# Patient Record
Sex: Female | Born: 1960 | Race: White | Hispanic: No | Marital: Single | Smoking: Never smoker
Health system: Southern US, Community
[De-identification: ages and names within clinical notes are randomized; demographics above are authoritative.]

## PROBLEM LIST (undated history)

## (undated) DIAGNOSIS — T7840XA Allergy, unspecified, initial encounter: Secondary | ICD-10-CM

## (undated) DIAGNOSIS — J45909 Unspecified asthma, uncomplicated: Secondary | ICD-10-CM

## (undated) DIAGNOSIS — R000 Tachycardia, unspecified: Secondary | ICD-10-CM

## (undated) DIAGNOSIS — J349 Unspecified disorder of nose and nasal sinuses: Secondary | ICD-10-CM

## (undated) DIAGNOSIS — T7491XA Unspecified adult maltreatment, confirmed, initial encounter: Secondary | ICD-10-CM

## (undated) DIAGNOSIS — F419 Anxiety disorder, unspecified: Secondary | ICD-10-CM

## (undated) DIAGNOSIS — I219 Acute myocardial infarction, unspecified: Secondary | ICD-10-CM

## (undated) DIAGNOSIS — R7989 Other specified abnormal findings of blood chemistry: Secondary | ICD-10-CM

## (undated) HISTORY — DX: Other specified abnormal findings of blood chemistry: R79.89

## (undated) HISTORY — DX: Allergy, unspecified, initial encounter: T78.40XA

## (undated) HISTORY — DX: Tachycardia, unspecified: R00.0

## (undated) HISTORY — DX: Acute myocardial infarction, unspecified: I21.9

## (undated) HISTORY — DX: Anxiety disorder, unspecified: F41.9

## (undated) HISTORY — DX: Unspecified asthma, uncomplicated: J45.909

## (undated) HISTORY — DX: Unspecified disorder of nose and nasal sinuses: J34.9

## (undated) HISTORY — DX: Unspecified adult maltreatment, confirmed, initial encounter: T74.91XA

---

## 1998-06-19 ENCOUNTER — Inpatient Hospital Stay (HOSPITAL_COMMUNITY): Admission: EM | Admit: 1998-06-19 | Discharge: 1998-06-22 | Payer: Self-pay | Admitting: Emergency Medicine

## 1998-06-19 ENCOUNTER — Encounter: Payer: Self-pay | Admitting: Emergency Medicine

## 1998-06-20 ENCOUNTER — Encounter: Payer: Self-pay | Admitting: Family Medicine

## 1998-06-26 ENCOUNTER — Encounter: Admission: RE | Admit: 1998-06-26 | Discharge: 1998-06-26 | Payer: Self-pay | Admitting: Family Medicine

## 2009-01-29 ENCOUNTER — Emergency Department (HOSPITAL_COMMUNITY): Admission: EM | Admit: 2009-01-29 | Discharge: 2009-01-30 | Payer: Self-pay | Admitting: Emergency Medicine

## 2009-09-18 ENCOUNTER — Ambulatory Visit: Payer: Self-pay | Admitting: Psychiatry

## 2009-09-26 ENCOUNTER — Ambulatory Visit: Payer: Self-pay | Admitting: Psychiatry

## 2009-10-09 ENCOUNTER — Ambulatory Visit: Payer: Self-pay | Admitting: Psychiatry

## 2010-04-14 ENCOUNTER — Emergency Department (HOSPITAL_COMMUNITY): Admission: EM | Admit: 2010-04-14 | Discharge: 2010-04-14 | Payer: Self-pay | Admitting: Emergency Medicine

## 2010-07-09 ENCOUNTER — Emergency Department (HOSPITAL_COMMUNITY)
Admission: EM | Admit: 2010-07-09 | Discharge: 2010-07-10 | Payer: Self-pay | Source: Home / Self Care | Admitting: Emergency Medicine

## 2010-12-23 LAB — CBC
HCT: 37.7 % (ref 36.0–46.0)
Hemoglobin: 12.6 g/dL (ref 12.0–15.0)
WBC: 10.3 10*3/uL (ref 4.0–10.5)

## 2010-12-23 LAB — BASIC METABOLIC PANEL
Calcium: 8.9 mg/dL (ref 8.4–10.5)
GFR calc Af Amer: >60 mL/min (ref >60)
GFR calc non Af Amer: >60 mL/min (ref >60)
Potassium: 3.7 mEq/L (ref 3.5–5.1)
Sodium: 135 mEq/L (ref 135–145)

## 2010-12-23 LAB — POCT CARDIAC MARKERS
CKMB, poc: 1.8 ng/mL (ref 1.0–8.0)
Myoglobin, poc: 32.4 ng/mL (ref 12–200)
Troponin i, poc: <0.05 ng/mL (ref 0.00–0.09)

## 2010-12-23 LAB — DIFFERENTIAL
Basophils Absolute: 0.3 10*3/uL — ABNORMAL HIGH (ref 0.0–0.1)
Basophils Relative: 3 % — ABNORMAL HIGH (ref 0–1)
Lymphocytes Relative: 25 % (ref 12–46)
Monocytes Absolute: 0.9 10*3/uL (ref 0.1–1.0)
Monocytes Relative: 9 % (ref 3–12)
Neutro Abs: 6.1 10*3/uL (ref 1.7–7.7)
Neutrophils Relative %: 60 % (ref 43–77)

## 2013-01-15 ENCOUNTER — Encounter: Payer: Self-pay | Admitting: Internal Medicine

## 2013-01-15 ENCOUNTER — Ambulatory Visit (INDEPENDENT_AMBULATORY_CARE_PROVIDER_SITE_OTHER): Payer: Medicaid Other | Admitting: Internal Medicine

## 2013-01-15 VITALS — BP 140/88 | HR 116 | Temp 97.7°F | Ht 60.0 in | Wt 88.2 lb

## 2013-01-15 DIAGNOSIS — J449 Chronic obstructive pulmonary disease, unspecified: Secondary | ICD-10-CM

## 2013-01-15 DIAGNOSIS — J4489 Other specified chronic obstructive pulmonary disease: Secondary | ICD-10-CM

## 2013-01-15 MED ORDER — PREDNISONE (PAK) 10 MG PO TABS: ORAL_TABLET | ORAL | Status: DC

## 2013-01-15 MED ORDER — BUDESONIDE-FORMOTEROL FUMARATE 160-4.5 MCG/ACT IN AERO: INHALATION_SPRAY | RESPIRATORY_TRACT | Status: DC

## 2013-01-15 NOTE — Progress Notes (Signed)
  Subjective:    Patient ID: Stacey Brock, female    DOB: July 30, 1961  MRN: [id number]  HPI  32 yowf never smoker with lifelong asthma last under control 2007 not seen by specialist despite failing symbicort 80 2 bid with multiple flares self referred to pulmonary clinic 01/15/13    01/15/2013 f/u ov/Lowery Paullin cc daily symptoms of sob and chest tightness with subjective wheeze x 5 years on symbicort 80 2bid with min assoc year round sneeze but ? Worse in fall, does not recall last pred rx but thinks "it worked some". Typically using saba at least twice daily for control including freq use of neb saba  No obvious daytime variabilty or assoc chronic cough  hb symptoms. No unusual exp hx or h/o childhood pna/ asthma or premature birth to her knowledge.   Sleeping ok without nocturnal  or early am exacerbation  of respiratory  c/o's or need for noct saba. Also denies any obvious fluctuation of symptoms with weather or environmental changes or other aggravating or alleviating factors except as outlined above      Review of Systems  Constitutional: Negative for fever and unexpected weight change.  HENT: Positive for sneezing. Negative for ear pain, nosebleeds, congestion, sore throat, rhinorrhea, trouble swallowing, dental problem, postnasal drip and sinus pressure.   Eyes: Negative for redness and itching.  Respiratory: Positive for shortness of breath. Negative for cough, chest tightness and wheezing.   Cardiovascular: Negative for palpitations and leg swelling.  Gastrointestinal: Negative for nausea and vomiting.  Genitourinary: Negative for dysuria.  Musculoskeletal: Negative for joint swelling.  Skin: Negative for rash.  Neurological: Positive for headaches.  Hematological: Does not bruise/bleed easily.  Psychiatric/Behavioral: Negative for dysphoric mood. The patient is not nervous/anxious.        Objective:   Physical Exam Thin amb wf nasal tone to voice  Wt Readings from Last 3  Encounters:  01/15/13 88 lb 3.2 oz (40.007 kg)     HEENT mild turbinate edema.  Oropharynx no thrush or excess pnd or cobblestoning.  No JVD or cervical adenopathy. Mild accessory muscle hypertrophy. Trachea midline, nl thryroid. Chest was hyperinflated by percussion with diminished breath sounds and moderate increased exp time without wheeze. Hoover sign positive at mid inspiration. Regular rate and rhythm without murmur gallop or rub or increase P2 or edema.  Abd: no hsm, nl excursion. Ext warm without cyanosis or clubbing.     cxr 07/09/10 showed hyperinflation    Assessment & Plan:

## 2013-01-15 NOTE — Patient Instructions (Addendum)
Work on inhaler technique:  relax and gently blow all the way out then take a nice smooth deep breath back in, triggering the inhaler at same time you start breathing in.  Hold for up to 5 seconds if you can.  Rinse and gargle with water when done   If your mouth or throat starts to bother you,   I suggest you time the inhaler to your dental care and after using the inhaler(s) brush teeth and tongue with a baking soda containing toothpaste and when you rinse this out, gargle with it first to see if this helps your mouth and throat.     symbicort 160 Take 2 puffs first thing in am and then another 2 puffs about 12 hours later.   Prednisone 10 mg take  4 each am x 2 days,   2 each am x 2 days,  1 each am x2days and stop   Return to see Yahayra NP in 2 weeks  Late add either use advair or laba/ics neb if not better with hfa Also needs alpha one AT testing and cxr on return then f/u with me with pfts

## 2013-01-16 DIAGNOSIS — J4550 Severe persistent asthma, uncomplicated: Secondary | ICD-10-CM | POA: Insufficient documentation

## 2013-01-16 NOTE — Assessment & Plan Note (Addendum)
DDX of  difficult airways managment all start with A and  include Adherence, Ace Inhibitors, Acid Reflux, Active Sinus Disease, Alpha 1 Antitripsin deficiency, Anxiety masquerading as Airways dz,  ABPA,  allergy(esp in young), Aspiration (esp in elderly), Adverse effects of DPI,  Active smokers, plus two Bs  = Bronchiectasis and Beta blocker use..and one C= CHF  Adherence is always the initial "prime suspect" and is a multilayered concern that requires a "trust but verify" approach in every patient - starting with knowing how to use medications, especially inhalers, correctly, keeping up with refills and understanding the fundamental difference between maintenance and prns vs those medications only taken for a very short course and then stopped and not refilled. The proper method of use, as well as anticipated side effects, of a metered-dose inhaler are discussed and demonstrated to the patient. Improved effectiveness after extensive coaching during this visit to a level of approximately  50% so first try max dose symbicort @ 2bid  ? Allergy > consider w/u next ov   Also needs alpha one AT testing and cxr on return then f/u with me with pfts

## 2013-01-29 ENCOUNTER — Ambulatory Visit (INDEPENDENT_AMBULATORY_CARE_PROVIDER_SITE_OTHER)
Admission: RE | Admit: 2013-01-29 | Discharge: 2013-01-29 | Disposition: A | Discharge status: Home / Self Care | Payer: Medicare Other | Source: Ambulatory Visit | Attending: Adult Health | Admitting: Adult Health

## 2013-01-29 ENCOUNTER — Other Ambulatory Visit: Payer: Medicare Other

## 2013-01-29 ENCOUNTER — Ambulatory Visit (INDEPENDENT_AMBULATORY_CARE_PROVIDER_SITE_OTHER): Payer: Medicare Other | Admitting: Adult Health

## 2013-01-29 ENCOUNTER — Encounter: Payer: Self-pay | Admitting: Adult Health

## 2013-01-29 VITALS — BP 118/66 | HR 108 | Temp 97.9°F | Ht 60.0 in | Wt 89.4 lb

## 2013-01-29 DIAGNOSIS — J449 Chronic obstructive pulmonary disease, unspecified: Secondary | ICD-10-CM

## 2013-01-29 MED ORDER — BUDESONIDE-FORMOTEROL FUMARATE 160-4.5 MCG/ACT IN AERO: INHALATION_SPRAY | RESPIRATORY_TRACT | Status: DC

## 2013-01-29 MED ORDER — ALBUTEROL SULFATE HFA 108 (90 BASE) MCG/ACT IN AERS: 2.0000 | INHALATION_SPRAY | Freq: Four times a day (QID) | RESPIRATORY_TRACT | Status: DC | PRN

## 2013-01-29 NOTE — Addendum Note (Signed)
Addended by: Boone Master E on: 01/29/2013 05:07 PM   Modules accepted: Orders

## 2013-01-29 NOTE — Assessment & Plan Note (Signed)
Recent Asthma flare now resolved  Check cxr and alpha one today  Plan  Continue on Symbicort 2 puffs Twice daily  , brush/rinse /gargle  We will call with labs and xray results.  Follow up Dr. Sherene Sires  In 4 weeks with PFTs

## 2013-01-29 NOTE — Progress Notes (Signed)
  Subjective:    Patient ID: Stacey Brock, female    DOB: [birth year]  MRN: [id number]  HPI  68 yowf never smoker with lifelong asthma last under control 2007 not seen by specialist despite failing symbicort 80 2 bid with multiple flares self referred to pulmonary clinic 01/15/13    01/15/2013 f/u ov/Wert cc daily symptoms of sob and chest tightness with subjective wheeze x 5 years on symbicort 80 2bid with min assoc year round sneeze but ? Worse in fall, does not recall last pred rx but thinks "it worked some". Typically using saba at least twice daily for control including freq use of neb saba >>Symbicort rx and steroid taper   01/29/2013 Follow up  Return for 2 week follow up asthma - reports breathing overall is better but does report some dry cough and sneezing with the weather change.  still taking prednisone (picked up rx last week).  Cough and dyspnea are better.  Started on Symbicort last ov. Given a steroid taper for asthma flare.  She has mild dry cough , drippy nose on/off.  No SABA use since last ov.  No fever or discolored mucus.      Review of Systems  Constitutional:   No  weight loss, night sweats,  Fevers, chills, fatigue, or  lassitude.  HEENT:   No headaches,  Difficulty swallowing,  Tooth/dental problems, or  Sore throat,                No sneezing, itching, ear ache + nasal congestion, post nasal drip,   CV:  No chest pain,  Orthopnea, PND, swelling in lower extremities, anasarca, dizziness, palpitations, syncope.   GI  No heartburn, indigestion, abdominal pain, nausea, vomiting, diarrhea, change in bowel habits, loss of appetite, bloody stools.   Resp: No coughing up of blood.  No change in color of mucus.  No wheezing.  No chest wall deformity  Skin: no rash or lesions.  GU: no dysuria, change in color of urine, no urgency or frequency.  No flank pain, no hematuria   MS:  No joint pain or swelling.  No decreased range of motion.  No back pain.  Psych:   No change in mood or affect. No depression or anxiety.  No memory loss.          Objective:   Physical Exam  GEN: A/Ox3; pleasant , NAD, thin   HEENT:  [place]/AT,  EACs-clear, TMs-wnl, NOSE-clear, THROAT-clear, no lesions, no postnasal drip or exudate noted.   NECK:  Supple w/ fair ROM; no JVD; normal carotid impulses w/o bruits; no thyromegaly or nodules palpated; no lymphadenopathy.  RESP  Clear  P & A; w/o, wheezes/ rales/ or rhonchi.no accessory muscle use, no dullness to percussion  CARD:  RRR, no m/r/g  , no peripheral edema, pulses intact, no cyanosis or clubbing.  GI:   Soft & nt; nml bowel sounds; no organomegaly or masses detected.  Musco: Warm bil, no deformities or joint swelling noted.   Neuro: alert, no focal deficits noted.    Skin: Warm, no lesions or rashes    cxr 07/09/10 showed hyperinflation    Assessment & Plan:

## 2013-01-29 NOTE — Patient Instructions (Addendum)
Continue on Symbicort 2 puffs Twice daily  , brush/rinse /gargle  Finish Prednisone taper.  We will call with labs and xray results.  Follow up Dr. Sherene Sires  In 4 weeks with PFTs

## 2013-02-02 LAB — ALPHA-1 ANTITRYPSIN PHENOTYPE

## 2013-02-02 NOTE — Progress Notes (Signed)
Quick Note:  Pt aware of cxr results / recs as stated by TP. PFT and ov w/ MW scheduled for 5.16.14. ______

## 2013-02-09 ENCOUNTER — Encounter: Payer: Self-pay | Admitting: Adult Health

## 2013-02-11 NOTE — Progress Notes (Signed)
Quick Note:  ATC pt x1 - automated message stated "this person is not available to receive messages at this time. Please call back later." Line was then disconnected. WCB. ______

## 2013-02-15 ENCOUNTER — Telehealth: Payer: Self-pay | Admitting: Internal Medicine

## 2013-02-15 NOTE — Telephone Encounter (Signed)
Not clear why as she does not have alpha one deficiency

## 2013-02-16 NOTE — Progress Notes (Signed)
Quick Note:  Called, spoke with pt. Informed her of lab results per TP. She verbalized understanding. ______

## 2013-02-16 NOTE — Telephone Encounter (Signed)
ATC Dr. Naoma Diener- No answer [facility]

## 2013-02-17 NOTE — Telephone Encounter (Signed)
Alpha 1 was actually ordered by TP during OV with TP on 01/29/13. Before calling Dr. Naoma Diener back, will check to see if TP called him. Jess, will you pls ask TP if she tried calling Dr. Naoma Diener regarding this pt and Alpha 1? Thank you!

## 2013-02-18 NOTE — Telephone Encounter (Signed)
Did not call. ? Unclear reason Pt has MS genotype but level was nml at 139.  To discuss in detail with Dr. Sherene Sires  On return to discuss genetic possibilities /family testing.

## 2013-02-18 NOTE — Telephone Encounter (Signed)
Tam, did you call Dr Naoma Diener by chance?

## 2013-02-18 NOTE — Telephone Encounter (Signed)
Dr Sherene Sires already spoke with Dr Naoma Diener yesterday, I can confirm this b/c I took the call

## 2013-02-19 ENCOUNTER — Encounter: Payer: Self-pay | Admitting: Internal Medicine

## 2013-02-19 ENCOUNTER — Ambulatory Visit (HOSPITAL_COMMUNITY)
Admission: RE | Admit: 2013-02-19 | Discharge: 2013-02-19 | Disposition: A | Discharge status: Home / Self Care | Payer: Medicare PPO | Source: Ambulatory Visit | Attending: Adult Health | Admitting: Adult Health

## 2013-02-19 ENCOUNTER — Ambulatory Visit (INDEPENDENT_AMBULATORY_CARE_PROVIDER_SITE_OTHER): Payer: Medicare HMO | Admitting: Internal Medicine

## 2013-02-19 VITALS — BP 100/72 | HR 105 | Temp 98.0°F | Ht 60.0 in | Wt 87.0 lb

## 2013-02-19 DIAGNOSIS — R05 Cough: Secondary | ICD-10-CM | POA: Insufficient documentation

## 2013-02-19 DIAGNOSIS — R0989 Other specified symptoms and signs involving the circulatory and respiratory systems: Secondary | ICD-10-CM | POA: Insufficient documentation

## 2013-02-19 DIAGNOSIS — R059 Cough, unspecified: Secondary | ICD-10-CM | POA: Insufficient documentation

## 2013-02-19 DIAGNOSIS — J449 Chronic obstructive pulmonary disease, unspecified: Secondary | ICD-10-CM

## 2013-02-19 DIAGNOSIS — R0609 Other forms of dyspnea: Secondary | ICD-10-CM | POA: Insufficient documentation

## 2013-02-19 DIAGNOSIS — J45909 Unspecified asthma, uncomplicated: Secondary | ICD-10-CM | POA: Insufficient documentation

## 2013-02-19 LAB — PULMONARY FUNCTION TEST

## 2013-02-19 MED ORDER — PREDNISONE (PAK) 10 MG PO TABS: ORAL_TABLET | ORAL | Status: DC

## 2013-02-19 MED ORDER — ALBUTEROL SULFATE (5 MG/ML) 0.5% IN NEBU
2.5000 mg | INHALATION_SOLUTION | Freq: Once | RESPIRATORY_TRACT | Status: AC
  Administered 2013-02-19: 2.5 mg via RESPIRATORY_TRACT

## 2013-02-19 NOTE — Progress Notes (Signed)
Subjective:    Patient ID: Stacey Brock, female    DOB: June 28, 1961  MRN: [id number]   Brief patient profile:  55 yowf never smoker ? Born prematurely  with lifelong asthma and MS genotype with adequate levels of Alpha One,  last under control 2007 not seen by specialist despite failing symbicort 80 2 bid with multiple flares self referred to pulmonary clinic 01/15/13   HPI 01/15/2013   ov/Tayanna Talford cc daily symptoms of sob and chest tightness with subjective wheeze x 5 years on symbicort 80 2bid with min assoc year round sneeze but ? Worse in fall, does not recall last pred rx but thinks "it worked some". Typically using saba at least twice daily for control including freq use of neb saba >>Symbicort rx and steroid taper   01/29/2013 Follow up  Return for 2 week follow up asthma - reports breathing overall is better but does report some dry cough and sneezing with the weather change.  still taking prednisone (picked up rx last week).  Cough and dyspnea are better.  Started on Symbicort last ov. Given a steroid taper for asthma flare.  She has mild dry cough , drippy nose on/off.  No SABA use since last ov.  No fever or discolored mucus. rec No change rx     02/19/2013 f/u ov/Saralee Bolick re chronic  asthma  Chief Complaint  Patient presents with  . Followup with PFT    Breathing unchanged since the last visit. Cough has resolved.    totally confused with meds and meaning of a prn despite 15-20 min discussion (see a/p)  No obvious daytime variabilty or assoc chronic cough or cp  overt sinus or hb symptoms. No unusual exp hx or h/o childhood pna   Sleeping ok without nocturnal  or early am exacerbation  of respiratory  c/o's or need for noct saba. Also denies any obvious fluctuation of symptoms with weather or environmental changes or other aggravating or alleviating factors except as outlined above   Current Medications, Allergies, Past Medical History, Past Surgical History, Family History,  and Social History were reviewed in [employer] record.  ROS  The following are not active complaints unless bolded sore throat, dysphagia, dental problems, itching, sneezing,  nasal congestion or excess/ purulent secretions, ear ache,   fever, chills, sweats, unintended wt loss, pleuritic or exertional cp, hemoptysis,  orthopnea pnd or leg swelling, presyncope, palpitations, heartburn, abdominal pain, anorexia, nausea, vomiting, diarrhea  or change in bowel or urinary habits, change in stools or urine, dysuria,hematuria,  rash, arthralgias, visual complaints, headache, numbness weakness or ataxia or problems with walking or coordination,  change in mood/affect or memory.                    Objective:   Physical Exam  Wt Readings from Last 3 Encounters:  02/19/13 87 lb (39.463 kg)  01/29/13 89 lb 6.4 oz (40.552 kg)  01/15/13 88 lb 3.2 oz (40.007 kg)     GEN: A/Ox3; pleasant , NAD, thin   HEENT:  [place]/AT,  EACs-clear, TMs-wnl, NOSE-clear, THROAT-clear, no lesions, no postnasal drip or exudate noted.   NECK:  Supple w/ fair ROM; no JVD; normal carotid impulses w/o bruits; no thyromegaly or nodules palpated; no lymphadenopathy.  RESP  Clear  P & A; w/o, wheezes/ rales/ or rhonchi.no accessory muscle use, no dullness to percussion  CARD:  RRR, no m/r/g  , no peripheral edema, pulses intact, no cyanosis or clubbing.  GI:  Soft & nt; nml bowel sounds; no organomegaly or masses detected.  Musco: Warm bil, no deformities or joint swelling noted.   Neuro: alert, no focal deficits noted.    Skin: Warm, no lesions or rashes    cxr 07/09/10 showed hyperinflation    Assessment & Plan:

## 2013-02-19 NOTE — Patient Instructions (Addendum)
Prednisone 10 mg take  4 each am x 2 days,   2 each am x 2 days,  1 each am x2days and stop  Work on inhaler technique:  relax and gently blow all the way out then take a nice smooth deep breath back in, triggering the inhaler at same time you start breathing in.  Hold for up to 5 seconds if you can.  Rinse and gargle with water when done   If your mouth or throat starts to bother you,   I suggest you time the inhaler to your dental care and after using the inhaler(s) brush teeth and tongue with a baking soda containing toothpaste and when you rinse this out, gargle with it first to see if this helps your mouth and throat.     symbicort 160 Take 2 puffs first thing in am and then another 2 puffs about 12 hours later.    Only use your albuterol as a rescue medication to be used if you can't catch your breath by resting or doing a relaxed purse lip breathing pattern. The less you use it, the better it will work when you need it.   See [place] NP w/in 2 weeks with all your medications, even over the counter meds, separated in two separate bags, the ones you take no matter what vs the ones you stop once you feel better and take only as needed when you feel you need them. Add: if can't learn mid change to perforomist and budesonide

## 2013-02-20 ENCOUNTER — Encounter: Payer: Self-pay | Admitting: Internal Medicine

## 2013-02-20 NOTE — Assessment & Plan Note (Addendum)
-   hfa 50% 01/15/2013  -alpha 1 01/29/2013 >> MS (level 139)  -PFT 02/19/2013 PFTs FEV 1  0.91 (38%) with ratio 49 and 16% better p B2  I had an extended discussion with the patient today lasting 15 to 20 minutes of a 25 minute visit on the following issues:  Severe chronic asthma and very high risk asthma death due to poor insight into meds and the difference between her maint rx with symbicort and her overused backup saba > See instructions for specific recommendations which were reviewed directly with the patient who was given a copy with highlighter outlining the key components. Will try f/u q 2 weeks for now using a trust but verify approach here.   The proper method of use, as well as anticipated side effects, of a metered-dose inhaler are discussed and demonstrated to the patient. Improved effectiveness after extensive coaching during this visit to a level of approximately  75%

## 2013-03-03 ENCOUNTER — Encounter: Payer: Self-pay | Admitting: Internal Medicine

## 2013-03-05 ENCOUNTER — Ambulatory Visit (INDEPENDENT_AMBULATORY_CARE_PROVIDER_SITE_OTHER): Payer: Medicare PPO | Admitting: Adult Health

## 2013-03-05 ENCOUNTER — Encounter: Payer: Self-pay | Admitting: Adult Health

## 2013-03-05 VITALS — BP 102/64 | HR 88 | Temp 98.4°F | Ht 60.0 in | Wt 86.6 lb

## 2013-03-05 DIAGNOSIS — R9389 Abnormal findings on diagnostic imaging of other specified body structures: Secondary | ICD-10-CM

## 2013-03-05 DIAGNOSIS — J9819 Other pulmonary collapse: Secondary | ICD-10-CM | POA: Insufficient documentation

## 2013-03-05 DIAGNOSIS — J45909 Unspecified asthma, uncomplicated: Secondary | ICD-10-CM

## 2013-03-05 DIAGNOSIS — J4550 Severe persistent asthma, uncomplicated: Secondary | ICD-10-CM

## 2013-03-05 DIAGNOSIS — R918 Other nonspecific abnormal finding of lung field: Secondary | ICD-10-CM

## 2013-03-05 HISTORY — DX: Other pulmonary collapse: J98.19

## 2013-03-05 NOTE — Assessment & Plan Note (Signed)
CXR 01/29/13 w/ RML consolidation ? Asymptomatic  Check CXR today

## 2013-03-05 NOTE — Patient Instructions (Addendum)
Continue on Symbicort 2 puffs Twice daily  , brush/rinse /gargle   Follow med calendar closely and bring to each visit  I will call with chest xray results.  Follow up Dr. Sherene Sires  In 3 months .

## 2013-03-05 NOTE — Assessment & Plan Note (Signed)
Recent exacerbation -now resolved  Cont on current regimen  Patient's medications were reviewed today and patient education was given. Computerized medication calendar was adjusted/completed

## 2013-03-05 NOTE — Progress Notes (Signed)
Subjective:    Patient ID: Stacey Brock, female    DOB: Nov 06, 1960  MRN: [id number]   Brief patient profile:  73 yowf never smoker ? Born prematurely  with lifelong asthma and MS genotype with adequate levels of Alpha One,  last under control 2007 not seen by specialist despite failing symbicort 80 2 bid with multiple flares self referred to pulmonary clinic 01/15/13   HPI 01/15/2013   ov/Wert cc daily symptoms of sob and chest tightness with subjective wheeze x 5 years on symbicort 80 2bid with min assoc year round sneeze but ? Worse in fall, does not recall last pred rx but thinks "it worked some". Typically using saba at least twice daily for control including freq use of neb saba >>Symbicort rx and steroid taper   01/29/2013 Follow up  Return for 2 week follow up asthma - reports breathing overall is better but does report some dry cough and sneezing with the weather change.  still taking prednisone (picked up rx last week).  Cough and dyspnea are better.  Started on Symbicort last ov. Given a steroid taper for asthma flare.  She has mild dry cough , drippy nose on/off.  No SABA use since last ov.  No fever or discolored mucus. rec No change rx     02/19/2013 f/u ov/Wert re chronic  asthma  Chief Complaint  Patient presents with  . Followup with PFT    Breathing unchanged since the last visit. Cough has resolved.    totally confused with meds and meaning of a prn despite 15-20 min discussion (see a/p)  >steroid taper    03/05/2013 Follow up for Asthma  She returns for 2 week follow up  Had recent asthma flare , tx w/ steroid taper now finished.  She was to return for med review , brought proAir  But left symbicort at home. We discussed maintenance meds vs As needed  Meds. Inhaler teaching.  Pt education given on meds.  Spirometry today shows no change in  FEV 1 at 39%.      ROS  The following are not active complaints unless bolded sore throat, dysphagia, dental  problems, itching, sneezing,  nasal congestion or excess/ purulent secretions, ear ache,   fever, chills, sweats, unintended wt loss, pleuritic or exertional cp, hemoptysis,  orthopnea pnd or leg swelling, presyncope, palpitations, heartburn, abdominal pain, anorexia, nausea, vomiting, diarrhea  or change in bowel or urinary habits, change in stools or urine, dysuria,hematuria,  rash, arthralgias, visual complaints, headache, numbness weakness or ataxia or problems with walking or coordination,  change in mood/affect or memory.                    Objective:   Physical Exam  GEN: A/Ox3; pleasant , NAD, thin   HEENT:  [place]/AT,  EACs-clear, TMs-wnl, NOSE-clear, THROAT-clear, no lesions, no postnasal drip or exudate noted.   NECK:  Supple w/ fair ROM; no JVD; normal carotid impulses w/o bruits; no thyromegaly or nodules palpated; no lymphadenopathy.  RESP  Clear  P & A; w/o, wheezes/ rales/ or rhonchi.no accessory muscle use, no dullness to percussion  CARD:  RRR, no m/r/g  , no peripheral edema, pulses intact, no cyanosis or clubbing.  GI:   Soft & nt; nml bowel sounds; no organomegaly or masses detected.  Musco: Warm bil, no deformities or joint swelling noted.   Neuro: alert, no focal deficits noted.    Skin: Warm, no lesions or rashes   01/29/13  Right middle lobe consolidation concerning for pneumonia.      Assessment & Plan:

## 2013-06-09 ENCOUNTER — Ambulatory Visit (INDEPENDENT_AMBULATORY_CARE_PROVIDER_SITE_OTHER): Payer: Commercial Managed Care - HMO | Admitting: Internal Medicine

## 2013-06-09 ENCOUNTER — Ambulatory Visit (INDEPENDENT_AMBULATORY_CARE_PROVIDER_SITE_OTHER)
Admission: RE | Admit: 2013-06-09 | Discharge: 2013-06-09 | Disposition: A | Discharge status: Home / Self Care | Payer: Medicare PPO | Source: Ambulatory Visit | Attending: Internal Medicine | Admitting: Internal Medicine

## 2013-06-09 ENCOUNTER — Encounter: Payer: Self-pay | Admitting: Internal Medicine

## 2013-06-09 VITALS — BP 122/60 | HR 85 | Temp 97.3°F | Ht 60.0 in | Wt 91.8 lb

## 2013-06-09 DIAGNOSIS — Z23 Encounter for immunization: Secondary | ICD-10-CM

## 2013-06-09 DIAGNOSIS — J45909 Unspecified asthma, uncomplicated: Secondary | ICD-10-CM

## 2013-06-09 DIAGNOSIS — R9389 Abnormal findings on diagnostic imaging of other specified body structures: Secondary | ICD-10-CM

## 2013-06-09 DIAGNOSIS — J4550 Severe persistent asthma, uncomplicated: Secondary | ICD-10-CM

## 2013-06-09 DIAGNOSIS — R918 Other nonspecific abnormal finding of lung field: Secondary | ICD-10-CM

## 2013-06-09 NOTE — Progress Notes (Signed)
Subjective:    Patient ID: Stacey Brock, female    DOB: Feb 16, 1961  MRN: [id number]   Brief patient profile:  9 yowf never smoker ? Born prematurely  with lifelong asthma and MS genotype with adequate levels of Alpha One,  last under control 2007 not seen by specialist despite failing symbicort 80 2 bid with multiple flares self referred to pulmonary clinic 01/15/13  Has had 02 since the 1990s  HPI 01/15/2013   ov/Stacey Brock cc daily symptoms of sob and chest tightness with subjective wheeze x 5 years on symbicort 80 2bid with min assoc year round sneeze but ? Worse in fall, does not recall last pred rx but thinks "it worked some". Typically using saba at least twice daily for control including freq use of neb saba >>Symbicort rx and steroid taper   01/29/2013 Follow up  Return for 2 week follow up asthma - reports breathing overall is better but does report some dry cough and sneezing with the weather change.  still taking prednisone (picked up rx last week).  Cough and dyspnea are better.  Started on Symbicort last ov. Given a steroid taper for asthma flare.  She has mild dry cough , drippy nose on/off.  No SABA use since last ov.  No fever or discolored mucus. rec No change rx     02/19/2013 f/u ov/Stacey Brock re chronic  asthma  Chief Complaint  Patient presents with  . Followup with PFT    Breathing unchanged since the last visit. Cough has resolved.   totally confused with meds and meaning of a prn despite 15-20 min discussion (see a/p)  >steroid taper / work on inhaler    03/05/2013 Follow up for Asthma  She returns for 2 week follow up  Had recent asthma flare , tx w/ steroid taper now finished.  She was to return for med review , brought proAir  But left symbicort at home. We discussed maintenance meds vs As needed  Meds. Inhaler teaching.  Pt education given on meds.  Spirometry today shows no change in  FEV 1 at 39%.  rec Continue on Symbicort 2 puffs Twice daily, brush/rinse  /gargle   Follow med calendar closely and bring to each visit    06/09/2013 f/u ov/Stacey Brock did not bring med calendar/ not able to use hfa/ using 02 prn  Chief Complaint  Patient presents with  . Follow-up    Breathing is unchanged since her last visit. No new co's today.    not really limited from desired activities by sob, ? How much using hfa  No obvious daytime variabilty or assoc chronic cough or cp or chest tightness, subjective wheeze overt sinus or hb symptoms. No unusual exp hx or h/o childhood pna/ asthma or knowledge of premature birth.  Sleeping ok without nocturnal  or early am exacerbation  of respiratory  c/o's or need for noct saba. Also denies any obvious fluctuation of symptoms with weather or environmental changes or other aggravating or alleviating factors except as outlined above      Current Medications, Allergies, Past Medical History, Past Surgical History, Family History, and Social History were reviewed in [employer] record.  ROS  The following are not active complaints unless bolded sore throat, dysphagia, dental problems, itching, sneezing,  nasal congestion or excess/ purulent secretions, ear ache,   fever, chills, sweats, unintended wt loss, pleuritic or exertional cp, hemoptysis,  orthopnea pnd or leg swelling, presyncope, palpitations, heartburn, abdominal pain, anorexia, nausea, vomiting,  diarrhea  or change in bowel or urinary habits, change in stools or urine, dysuria,hematuria,  rash, arthralgias, visual complaints, headache, numbness weakness or ataxia or problems with walking or coordination,  change in mood/affect or memory.                        Objective:   Physical Exam   GEN: A/Ox3; pleasant , NAD, thin unsual affect, easily confused with responses to the simplest questions, esp re meds  Wt Readings from Last 3 Encounters:  06/09/13 91 lb 12.8 oz (41.64 kg)  03/05/13 86 lb 9.6 oz (39.282 kg)  02/19/13 87 lb  (39.463 kg)     HEENT:  [place]/AT,  EACs-clear, TMs-wnl, NOSE-clear, THROAT-clear, no lesions, no postnasal drip or exudate noted.   NECK:  Supple w/ fair ROM; no JVD; normal carotid impulses w/o bruits; no thyromegaly or nodules palpated; no lymphadenopathy.  RESP  Clear  P & A; w/o, wheezes/ rales/ or rhonchi.no accessory muscle use, no dullness to percussion  CARD:  RRR, no m/r/g  , no peripheral edema, pulses intact, no cyanosis or clubbing.  GI:   Soft & nt; nml bowel sounds; no organomegaly or masses detected.  Musco: Warm bil, no deformities or joint swelling noted.   Neuro: alert, no focal deficits noted.    Skin: Warm, no lesions or rashes        CXR  06/09/2013 :   Improved aeration in the right middle lobe. Mild residual scarring is noted.     Assessment & Plan:

## 2013-06-09 NOTE — Patient Instructions (Addendum)
Please remember to go to the  x-ray department downstairs for your tests - we will call you with the results when they are available.  Please schedule a follow up office visit in 6 weeks, call sooner if needed > to See Naiyah NP If not mastering hfa change to advair 250/50 next ov

## 2013-06-10 NOTE — Progress Notes (Signed)
Quick Note:  Spoke with pt and notified of results per Dr. Wert. Pt verbalized understanding and denied any questions.  ______ 

## 2013-06-11 NOTE — Assessment & Plan Note (Signed)
-   hfa 50% 01/15/2013 > 75% 02/19/13 -alpha 1 01/29/2013 >> MS (level 139)  -PFT 02/19/2013 PFTs FEV 1  0.91 (38%) with ratio 49 and 16% better p B2 -Spiro FEV 1 (39%) 03/05/2013  - med calendar 03/05/2013 > not following 06/09/13   I had an extended discussion with the patient today lasting 15 to 20 minutes of a 25 minute visit on the following issues:  Not clear we can help her in this clinic as out  Patient unless she gets more supports from friends/ relatives to help her take her meds correctly    Each maintenance medication was reviewed in detail including most importantly the difference between maintenance and as needed and under what circumstances the prns are to be used.  Please see instructions for details which were reviewed in writing and the patient given a copy.    Will regroup with all meds/ consider change to dpi if can't master hfa  The proper method of use, as well as anticipated side effects, of a metered-dose inhaler are discussed and demonstrated to the patient. Improved effectiveness after extensive coaching during this visit to a level of approximately  50% no better

## 2013-06-11 NOTE — Assessment & Plan Note (Signed)
01/29/13 RML consolidation  03/05/2013 CXR > repeat 06/09/13 scarring only > no further f/u needed

## 2013-07-22 ENCOUNTER — Ambulatory Visit: Payer: Medicare PPO | Admitting: Adult Health

## 2013-07-23 ENCOUNTER — Ambulatory Visit: Payer: Medicare PPO | Admitting: Adult Health

## 2013-07-29 ENCOUNTER — Ambulatory Visit (INDEPENDENT_AMBULATORY_CARE_PROVIDER_SITE_OTHER): Payer: Medicare PPO | Admitting: Adult Health

## 2013-07-29 ENCOUNTER — Encounter: Payer: Self-pay | Admitting: Adult Health

## 2013-07-29 VITALS — BP 112/72 | Temp 97.8°F | Wt 89.4 lb

## 2013-07-29 DIAGNOSIS — J4550 Severe persistent asthma, uncomplicated: Secondary | ICD-10-CM

## 2013-07-29 DIAGNOSIS — J45909 Unspecified asthma, uncomplicated: Secondary | ICD-10-CM

## 2013-07-29 MED ORDER — PREDNISONE 10 MG PO TABS: ORAL_TABLET | ORAL | Status: DC

## 2013-07-29 NOTE — Patient Instructions (Addendum)
Prednisone taper over next week.  Claritin 10mg  daily As needed  Drainage  Continue on Symbicort 2 puffs Twice daily  , rinse after use.  Follow up Dr. Sherene Sires  In 4 months and As needed   Please contact office for sooner follow up if symptoms do not improve or worsen or seek emergency care

## 2013-08-03 NOTE — Assessment & Plan Note (Signed)
Mild flare   Plan  Prednisone taper over next week.  Claritin 10mg  daily As needed  Drainage  Continue on Symbicort 2 puffs Twice daily  , rinse after use.  Follow up Dr. Sherene Sires  In 4 months and As needed   Please contact office for sooner follow up if symptoms do not improve or worsen or seek emergency care

## 2013-08-03 NOTE — Progress Notes (Signed)
Subjective:    Patient ID: Stacey Brock, female    DOB: 11-07-1960  MRN: [id number]   Brief patient profile:  7 yowf never smoker ? Born prematurely  with lifelong asthma and MS genotype with adequate levels of Alpha One,  last under control 2007 not seen by specialist despite failing symbicort 80 2 bid with multiple flares self referred to pulmonary clinic 01/15/13  Has had 02 since the 1990s  HPI 01/15/2013   ov/Wert cc daily symptoms of sob and chest tightness with subjective wheeze x 5 years on symbicort 80 2bid with min assoc year round sneeze but ? Worse in fall, does not recall last pred rx but thinks "it worked some". Typically using saba at least twice daily for control including freq use of neb saba >>Symbicort rx and steroid taper   01/29/2013 Follow up  Return for 2 week follow up asthma - reports breathing overall is better but does report some dry cough and sneezing with the weather change.  still taking prednisone (picked up rx last week).  Cough and dyspnea are better.  Started on Symbicort last ov. Given a steroid taper for asthma flare.  She has mild dry cough , drippy nose on/off.  No SABA use since last ov.  No fever or discolored mucus. rec No change rx     02/19/2013 f/u ov/Wert re chronic  asthma  Chief Complaint  Patient presents with  . Followup with PFT    Breathing unchanged since the last visit. Cough has resolved.   totally confused with meds and meaning of a prn despite 15-20 min discussion (see a/p)  >steroid taper / work on inhaler    03/05/2013 Follow up for Asthma  She returns for 2 week follow up  Had recent asthma flare , tx w/ steroid taper now finished.  She was to return for med review , brought proAir  But left symbicort at home. We discussed maintenance meds vs As needed  Meds. Inhaler teaching.  Pt education given on meds.  Spirometry today shows no change in  FEV 1 at 39%.  rec Continue on Symbicort 2 puffs Twice daily, brush/rinse  /gargle   Follow med calendar closely and bring to each visit    06/09/2013 f/u ov/Wert did not bring med calendar/ not able to use hfa/ using 02 prn  Chief Complaint  Patient presents with  . Follow-up    Breathing is unchanged since her last visit. No new co's today.    not really limited from desired activities by sob, ? How much using hfa  07/29/13 Follow up  Returns for a 6 week follow up .  Says over last week , breathing not as good.  More wheezing with change in weather.  No fever, discolored mucus, chest pain, orthopnea, or edema.  Has dry cough , some nasal drainage.  Took some mucinex DM without much help.     Current Medications, Allergies, Past Medical History, Past Surgical History, Family History, and Social History were reviewed in [employer] record.  ROS  The following are not active complaints unless bolded sore throat, dysphagia, dental problems, itching, sneezing,  nasal congestion or excess/ purulent secretions, ear ache,   fever, chills, sweats, unintended wt loss, pleuritic or exertional cp, hemoptysis,  orthopnea pnd or leg swelling, presyncope, palpitations, heartburn, abdominal pain, anorexia, nausea, vomiting, diarrhea  or change in bowel or urinary habits, change in stools or urine, dysuria,hematuria,  rash, arthralgias, visual complaints, headache, numbness  weakness or ataxia or problems with walking or coordination,  change in mood/affect or memory.                        Objective:   Physical Exam   GEN: A/Ox3; pleasant , NAD, thin unsual affect,     HEENT:  [place]/AT,  EACs-clear, TMs-wnl, NOSE-clear, THROAT-clear, no lesions, no postnasal drip or exudate noted.   NECK:  Supple w/ fair ROM; no JVD; normal carotid impulses w/o bruits; no thyromegaly or nodules palpated; no lymphadenopathy.  RESP  Faint exp wheeze no accessory muscle use, no dullness to percussion  CARD:  RRR, no m/r/g  , no peripheral edema, pulses  intact, no cyanosis or clubbing.  GI:   Soft & nt; nml bowel sounds; no organomegaly or masses detected.  Musco: Warm bil, no deformities or joint swelling noted.   Neuro: alert, no focal deficits noted.    Skin: Warm, no lesions or rashes        CXR  06/09/2013 :   Improved aeration in the right middle lobe. Mild residual scarring is noted.     Assessment & Plan:

## 2013-09-23 ENCOUNTER — Telehealth: Payer: Self-pay | Admitting: *Deleted

## 2013-09-23 MED ORDER — ALBUTEROL SULFATE HFA 108 (90 BASE) MCG/ACT IN AERS: 2.0000 | INHALATION_SPRAY | Freq: Four times a day (QID) | RESPIRATORY_TRACT | Status: DC | PRN

## 2013-09-23 NOTE — Telephone Encounter (Signed)
Called and spoke with pt and she stated that she needed a refill of her albuterol hfa.  This has been sent to the pharmacy and pt is aware.

## 2013-12-20 ENCOUNTER — Encounter: Payer: Self-pay | Admitting: Adult Health

## 2013-12-20 ENCOUNTER — Ambulatory Visit (INDEPENDENT_AMBULATORY_CARE_PROVIDER_SITE_OTHER): Payer: Medicare HMO | Admitting: Adult Health

## 2013-12-20 VITALS — BP 118/88 | HR 117 | Temp 97.8°F | Ht 60.0 in | Wt 89.2 lb

## 2013-12-20 DIAGNOSIS — J45909 Unspecified asthma, uncomplicated: Secondary | ICD-10-CM

## 2013-12-20 DIAGNOSIS — J4550 Severe persistent asthma, uncomplicated: Secondary | ICD-10-CM

## 2013-12-20 MED ORDER — PREDNISONE 10 MG PO TABS: ORAL_TABLET | ORAL | Status: DC

## 2013-12-20 NOTE — Patient Instructions (Signed)
Prednisone taper over next week.  Claritin 10mg  daily As needed  Drainage  Mucinex DM Twice daily  As needed  Cough/congestion  Continue on Symbicort 2 puffs Twice daily  , rinse after use.  Follow up Dr. Melvyn Novas  In 4 months and As needed   Please contact office for sooner follow up if symptoms do not improve or worsen or seek emergency care

## 2013-12-20 NOTE — Assessment & Plan Note (Signed)
Mild flare w/ AR   Plan  Prednisone taper over next week.  Claritin 10mg  daily As needed  Drainage  Mucinex DM Twice daily  As needed  Cough/congestion  Continue on Symbicort 2 puffs Twice daily  , rinse after use.  Follow up Dr. Melvyn Novas  In 4 months and As needed   Please contact office for sooner follow up if symptoms do not improve or worsen or seek emergency care

## 2013-12-20 NOTE — Progress Notes (Signed)
Subjective:    Patient ID: Stacey Brock, female    DOB: 1961-01-09  MRN: [id number] Brief patient profile:  34 yowf never smoker ? Born prematurely  with lifelong asthma and MS genotype with adequate levels of Alpha One,  last under control 2007 not seen by specialist despite failing symbicort 80 2 bid with multiple flares self referred to pulmonary clinic 01/15/13  Has had 02 since the 1990s  HPI 01/15/2013   ov/Wert cc daily symptoms of sob and chest tightness with subjective wheeze x 5 years on symbicort 80 2bid with min assoc year round sneeze but ? Worse in fall, does not recall last pred rx but thinks "it worked some". Typically using saba at least twice daily for control including freq use of neb saba >>Symbicort rx and steroid taper   01/29/2013 Follow up  Return for 2 week follow up asthma - reports breathing overall is better but does report some dry cough and sneezing with the weather change.  still taking prednisone (picked up rx last week).  Cough and dyspnea are better.  Started on Symbicort last ov. Given a steroid taper for asthma flare.  She has mild dry cough , drippy nose on/off.  No SABA use since last ov.  No fever or discolored mucus. rec No change rx     02/19/2013 f/u ov/Wert re chronic  asthma  Chief Complaint  Patient presents with  . Followup with PFT    Breathing unchanged since the last visit. Cough has resolved.   totally confused with meds and meaning of a prn despite 15-20 min discussion (see a/p)  >steroid taper / work on inhaler    03/05/2013 Follow up for Asthma  She returns for 2 week follow up  Had recent asthma flare , tx w/ steroid taper now finished.  She was to return for med review , brought proAir  But left symbicort at home. We discussed maintenance meds vs As needed  Meds. Inhaler teaching.  Pt education given on meds.  Spirometry today shows no change in  FEV 1 at 39%.  rec Continue on Symbicort 2 puffs Twice daily, brush/rinse  /gargle   Follow med calendar closely and bring to each visit    06/09/2013 f/u ov/Wert did not bring med calendar/ not able to use hfa/ using 02 prn  Chief Complaint  Patient presents with  . Follow-up    Breathing is unchanged since her last visit. No new co's today.    not really limited from desired activities by sob, ? How much using hfa  07/29/13 Follow up  Returns for a 6 week follow up .  Says over last week , breathing not as good.  More wheezing with change in weather.  No fever, discolored mucus, chest pain, orthopnea, or edema.  Has dry cough , some nasal drainage.  Took some mucinex DM without much help.   12/20/2013 Follow Up  MW pt here for Asthma follow up. More frequent use of albuterol inhaler. Increased dry cough and wheezing for last 1 week.  Pt requests another dose of pred taper.   NO fever, discolored mucus , edema or chest pain .  Remains on Symbicort 2puffs Twice daily   Has some nasal drip and drainage.  .  Current Medications, Allergies, Past Medical History, Past Surgical History, Family History, and Social History were reviewed in [employer] record.  ROS  The following are not active complaints unless bolded sore throat, dysphagia, dental problems,  itching, sneezing,  nasal congestion or excess/ purulent secretions, ear ache,   fever, chills, sweats, unintended wt loss, pleuritic or exertional cp, hemoptysis,  orthopnea pnd or leg swelling, presyncope, palpitations, heartburn, abdominal pain, anorexia, nausea, vomiting, diarrhea  or change in bowel or urinary habits, change in stools or urine, dysuria,hematuria,  rash, arthralgias, visual complaints, headache, numbness weakness or ataxia or problems with walking or coordination,  change in mood/affect or memory.                        Objective:   Physical Exam   GEN: A/Ox3; pleasant , NAD, thin unsual affect,     HEENT:  [place]/AT,  EACs-clear, TMs-wnl, NOSE-clear,  THROAT-clear, no lesions, no postnasal drip or exudate noted.   NECK:  Supple w/ fair ROM; no JVD; normal carotid impulses w/o bruits; no thyromegaly or nodules palpated; no lymphadenopathy.  RESP  CTA , no wheezing, no accessory muscle use, no dullness to percussion  CARD:  RRR, no m/r/g  , no peripheral edema, pulses intact, no cyanosis or clubbing.  GI:   Soft & nt; nml bowel sounds; no organomegaly or masses detected.  Musco: Warm bil, no deformities or joint swelling noted.   Neuro: alert, no focal deficits noted.    Skin: Warm, no lesions or rashes        CXR  06/09/2013 :  Improved aeration in the right middle lobe. Mild residual scarring is noted.     Assessment & Plan:

## 2014-02-03 ENCOUNTER — Other Ambulatory Visit: Payer: Self-pay | Admitting: Adult Health

## 2014-04-19 ENCOUNTER — Encounter: Payer: Self-pay | Admitting: Internal Medicine

## 2014-04-19 ENCOUNTER — Ambulatory Visit (INDEPENDENT_AMBULATORY_CARE_PROVIDER_SITE_OTHER): Payer: Medicare HMO | Admitting: Internal Medicine

## 2014-04-19 VITALS — BP 126/80 | HR 92 | Temp 97.6°F | Ht 59.25 in | Wt 86.4 lb

## 2014-04-19 DIAGNOSIS — J4550 Severe persistent asthma, uncomplicated: Secondary | ICD-10-CM

## 2014-04-19 DIAGNOSIS — J45909 Unspecified asthma, uncomplicated: Secondary | ICD-10-CM

## 2014-04-19 MED ORDER — ALBUTEROL SULFATE HFA 108 (90 BASE) MCG/ACT IN AERS: 2.0000 | INHALATION_SPRAY | RESPIRATORY_TRACT | Status: DC | PRN

## 2014-04-19 NOTE — Patient Instructions (Addendum)
Symbicort 160 Take 2 puffs first thing in am and then another 2 puffs about 12 hours later.   For breathing: Only use your albuterol (proair) as a rescue medication to be used if you can't catch your breath by resting or doing a relaxed purse lip breathing pattern.  - The less you use it, the better it will work when you need it. - Ok to use up to 2 puffs  every 4 hours if you must but call for immediate appointment if use goes up over your usual need - Don't leave home without it !!  (think of it like the spare tire for your car)    For itchy, sneezy runny nose, drainage > clariton or  can take chlortrimeton (chlorpheniramine) 4 mg every 4 hours available over the counter (may cause drowsiness)   Please schedule a follow up office visit in 4 weeks, sooner if needed to see Dr Lynelle Smoke  Check her hfa and consider change to [facility] if not adequate

## 2014-04-19 NOTE — Progress Notes (Signed)
Subjective:   Patient ID: Stacey Brock, female    DOB: [birth year]  MRN: [id number]   Brief patient profile:  49 yowf never smoker ? Born prematurely  with lifelong asthma and MS genotype with adequate levels of Alpha One,  last under control 2007 not seen by specialist despite failing symbicort 80 2 bid with multiple flares self referred to pulmonary clinic 01/15/13  Has had 02 since the 1990s    History of Present Illness  01/15/2013   ov/Stacey Brock cc daily symptoms of sob and chest tightness with subjective wheeze x 5 years on symbicort 80 2bid with min assoc year round sneeze but ? Worse in fall, does not recall last pred rx but thinks "it worked some". Typically using saba at least twice daily for control including freq use of neb saba >>Symbicort rx and steroid taper   01/29/2013 Follow up  Return for 2 week follow up asthma - reports breathing overall is better but does report some dry cough and sneezing with the weather change.  still taking prednisone (picked up rx last week).  Cough and dyspnea are better.  Started on Symbicort last ov. Given a steroid taper for asthma flare.  She has mild dry cough , drippy nose on/off.  No SABA use since last ov.  No fever or discolored mucus. rec No change rx     02/19/2013 f/u ov/Stacey Brock re chronic  asthma  Chief Complaint  Patient presents with  . Followup with PFT    Breathing unchanged since the last visit. Cough has resolved.   totally confused with meds and meaning of a prn despite 15-20 min discussion (see a/p)  >steroid taper / work on inhaler    03/05/2013 Follow up for Asthma  She returns for 2 week follow up  Had recent asthma flare , tx w/ steroid taper now finished.  She was to return for med review , brought proAir  But left symbicort at home. We discussed maintenance meds vs As needed  Meds. Inhaler teaching.  Pt education given on meds.  Spirometry today shows no change in  FEV 1 at 39%.  rec Continue on Symbicort 2 puffs  Twice daily, brush/rinse /gargle   Follow med calendar closely and bring to each visit      12/20/2013 Follow Up  MW pt here for Asthma follow up. More frequent use of albuterol inhaler. Increased dry cough and wheezing for last 1 week.  Pt requests another dose of pred taper.   NO fever, discolored mucus , edema or chest pain .  Remains on Symbicort 2puffs Twice daily   Has some nasal drip and drainage.  rec Prednisone taper over next week.  Claritin 10mg  daily As needed  Drainage  Mucinex DM Twice daily  As needed  Cough/congestion  Continue on Symbicort 2 puffs Twice daily  , rinse after use   04/19/2014 f/u ov/Stacey Brock re: symbicort 80 bid/ ran out of pro weeks prior to [place] Chief Complaint  Patient presents with  . Follow-up    Pt states that her breathing is doing well. She c/o runny nose for the past 2 months.   Wants proair last filled 02/03/14 claritin helped but couldn't afford it . Still very poor hfa/ no med calendar   .  Current Medications, Allergies, Past Medical History, Past Surgical History, Family History, and Social History were reviewed in [employer] record.  ROS  The following are not active complaints unless bolded sore  throat, dysphagia, dental problems, itching, sneezing,  nasal congestion or excess/ purulent secretions, ear ache,   fever, chills, sweats, unintended wt loss, pleuritic or exertional cp, hemoptysis,  orthopnea pnd or leg swelling, presyncope, palpitations, heartburn, abdominal pain, anorexia, nausea, vomiting, diarrhea  or change in bowel or urinary habits, change in stools or urine, dysuria,hematuria,  rash, arthralgias, visual complaints, headache, numbness weakness or ataxia or problems with walking or coordination,  change in mood/affect or memory.                        Objective:   Physical Exam   GEN: A/Ox3; pleasant , NAD, thin unsual affect  Wt Readings from Last 3 Encounters:  04/19/14 86 lb  6.4 oz (39.191 kg)  12/20/13 89 lb 3.2 oz (40.461 kg)  07/29/13 89 lb 6.4 oz (40.552 kg)        HEENT:  [place]/AT,  EACs-clear, TMs-wnl, NOSE-clear, THROAT-clear, no lesions, no postnasal drip or exudate noted.   NECK:  Supple w/ fair ROM; no JVD; normal carotid impulses w/o bruits; no thyromegaly or nodules palpated; no lymphadenopathy.  RESP  :  Late exp wheeze bilaterally   CARD:  RRR, no m/r/g  , no peripheral edema, pulses intact, no cyanosis or clubbing.  GI:   Soft & nt; nml bowel sounds; no organomegaly or masses detected.  Musco: Warm bil, no deformities or joint swelling noted.   Neuro: alert, no focal deficits noted.    Skin: Warm, no lesions or rashes        CXR  06/09/2013 :  Improved aeration in the right middle lobe. Mild residual scarring is noted.     Assessment & Plan:

## 2014-04-20 NOTE — Assessment & Plan Note (Signed)
-   hfa 50% 01/15/2013 > 75% 02/19/13 -alpha 1 01/29/2013 >> MS (level 139)  -PFT 02/19/2013 PFTs FEV 1  0.91 (38%) with ratio 49 and 16% better p B2 -Spiro FEV 1 (39%) 03/05/2013  - med calendar 03/05/2013 > not following 06/09/13    DDX of  difficult airways management all start with A and  include Adherence, Ace Inhibitors, Acid Reflux, Active Sinus Disease, Alpha 1 Antitripsin deficiency, Anxiety masquerading as Airways dz,  ABPA,  allergy(esp in young), Aspiration (esp in elderly), Adverse effects of DPI,  Active smokers, plus two Bs  = Bronchiectasis and Beta blocker use..and one C= CHF  Adherence is always the initial "prime suspect" and is a multilayered concern that requires a "trust but verify" approach in every patient - starting with knowing how to use medications, especially inhalers, correctly, keeping up with refills and understanding the fundamental difference between maintenance and prns vs those medications only taken for a very short course and then stopped and not refilled.  - The proper method of use, as well as anticipated side effects, of a metered-dose inhaler are discussed and demonstrated to the patient. Improved effectiveness after extensive coaching during this visit to a level of approximately  50% but no better ? Change to dpi next ov  ? Allergy/ rhinitis >  Try 1st gen h1   F/u should be q 4 weeks until get her back under good control/ assure compliance using a trust but verify approach

## 2014-05-19 ENCOUNTER — Ambulatory Visit: Payer: Commercial Managed Care - HMO | Admitting: Adult Health

## 2014-09-15 ENCOUNTER — Telehealth: Payer: Self-pay | Admitting: Internal Medicine

## 2014-09-15 NOTE — Telephone Encounter (Signed)
Pt c/o wheezing, runny nose, non prod cough.  Denies fever.    Pt states she has nebulizer at home but no meds.  Pt also states she has oxygen at home and uses it prn.  Pt given appt with Dr Melvyn Novas 09/16/14 at 4:30.

## 2014-09-15 NOTE — Telephone Encounter (Signed)
ATC x 2 - Line busy - Will try back

## 2014-09-16 ENCOUNTER — Ambulatory Visit (INDEPENDENT_AMBULATORY_CARE_PROVIDER_SITE_OTHER): Payer: Commercial Managed Care - HMO | Admitting: Internal Medicine

## 2014-09-16 ENCOUNTER — Encounter: Payer: Self-pay | Admitting: Internal Medicine

## 2014-09-16 VITALS — BP 100/82 | HR 96 | Temp 97.1°F | Ht 60.0 in | Wt 86.0 lb

## 2014-09-16 DIAGNOSIS — J4550 Severe persistent asthma, uncomplicated: Secondary | ICD-10-CM

## 2014-09-16 NOTE — Patient Instructions (Signed)
Work on inhaler technique:  relax and gently blow all the way out then take a nice smooth deep breath back in, triggering the inhaler at same time you start breathing in.  Hold for up to 5 seconds if you can.  Rinse and gargle with water when done  Plan A = automatic = symbicort 160  Take 2 puffs first thing in am and then another 2 puffs about 12 hours later.   Plan B= backup Only use your albuterol (proair)as a rescue medication to be used if you can't catch your breath by resting or doing a relaxed purse lip breathing pattern.  - The less you use it, the better it will work when you need it. - Ok to use up to 2 puffs  every 4 hours if you must but call for immediate appointment if use goes up over your usual need - Don't leave home without it !!  (think of it like the spare tire for your car)   Please schedule a follow up visit in 3 months but call sooner if needed  To see [facility] NP with meds in hand

## 2014-09-16 NOTE — Progress Notes (Signed)
Subjective:   Patient ID: Stacey Brock, female    DOB: August 29, 1961  MRN: [id number]   Brief patient profile:  40 yowf never smoker ? Born prematurely  with lifelong asthma and MS genotype with adequate levels of Alpha One,  last under control 2007 not seen by specialist despite failing symbicort 80 2 bid with multiple flares self referred to pulmonary clinic 01/15/13  Has had 02 since the 1990s but not using.    History of Present Illness  01/15/2013   ov/Stacey Brock cc daily symptoms of sob and chest tightness with subjective wheeze x 5 years on symbicort 80 2bid with min assoc year round sneeze but ? Worse in fall, does not recall last pred rx but thinks "it worked some". Typically using saba at least twice daily for control including freq use of neb saba >>Symbicort rx and steroid taper    02/19/2013 f/u ov/Stacey Brock re chronic  asthma  Chief Complaint  Patient presents with  . Followup with PFT    Breathing unchanged since the last visit. Cough has resolved.   totally confused with meds and meaning of a prn despite 15-20 min discussion (see a/p)  >steroid taper / work on inhaler technique   04/19/2014 f/u ov/Stacey Brock re: symbicort 80 bid/ ran out of pro weeks prior to [place] Chief Complaint  Patient presents with  . Follow-up    Pt states that her breathing is doing well. She c/o runny nose for the past 2 months.   Wants proair last filled 02/03/14 claritin helped but couldn't afford it . Still very poor hfa/ no med calendar  rec Symbicort 160 Take 2 puffs first thing in am and then another 2 puffs about 12 hours later.  Only use your albuterol (proair)  As rescue For itchy, sneezy runny nose, drainage > clariton or  can take chlortrimeton (chlorpheniramine) 4 mg every 4 hours available over the counter (may cause drowsiness)  Please schedule a follow up office visit in 4 weeks, sooner if needed to see  NP Tashanda > did not do  Check her hfa and consider change to BREO if not adequate      09/16/2014 f/u ov/Stacey Brock re: chronic asthma on symbicort 160 plus singulair maint rx  Chief Complaint  Patient presents with  . Acute Visit    Pt c/o cough and wheezing. She has not had sob or chest tightness. She has been using her inhalers.  no noct spells/ not using saba much at all/ still poor hfa    Not limited by breathing from desired activities    No obvious day to day or daytime variabilty or assoc excess or purulent sputum  or cp or chest tightness, subjective wheeze overt sinus or hb symptoms. No unusual exp hx or h/o childhood pna/ asthma or knowledge of premature birth.  Sleeping ok without nocturnal  or early am exacerbation  of respiratory  c/o's or need for noct saba. Also denies any obvious fluctuation of symptoms with weather or environmental changes or other aggravating or alleviating factors except as outlined above   Current Medications, Allergies, Complete Past Medical History, Past Surgical History, Family History, and Social History were reviewed in [employer] record.  ROS  The following are not active complaints unless bolded sore throat, dysphagia, dental problems, itching, sneezing,  nasal congestion or excess/ purulent secretions, ear ache,   fever, chills, sweats, unintended wt loss, pleuritic or exertional cp, hemoptysis,  orthopnea pnd or leg swelling, presyncope,  palpitations, heartburn, abdominal pain, anorexia, nausea, vomiting, diarrhea  or change in bowel or urinary habits, change in stools or urine, dysuria,hematuria,  rash, arthralgias, visual complaints, headache, numbness weakness or ataxia or problems with walking or coordination,  change in mood/affect or memory.               Objective:   Physical Exam   GEN: A/Ox3; pleasant , NAD, thin amb wf  unsual affect  09/16/2014      86  Wt Readings from Last 3 Encounters:  04/19/14 86 lb 6.4 oz (39.191 kg)  12/20/13 89 lb 3.2 oz (40.461 kg)  07/29/13 89 lb 6.4 oz (40.552  kg)        HEENT:  /AT,  EACs-clear, TMs-wnl, NOSE-clear, THROAT-clear, no lesions, no postnasal drip or exudate noted.   NECK:  Supple w/ fair ROM; no JVD; normal carotid impulses w/o bruits; no thyromegaly or nodules palpated; no lymphadenopathy.  RESP  :  Clear bilaterally   CARD:  RRR, no m/r/g  , no peripheral edema, pulses intact, no cyanosis or clubbing.  GI:   Soft & nt; nml bowel sounds; no organomegaly or masses detected.  Musco: Warm bil, no deformities or joint swelling noted.   Neuro: alert, no focal deficits noted.    Skin: Warm, no lesions or rashes        CXR  06/09/2013 :  Improved aeration in the right middle lobe. Mild residual scarring is noted.     Assessment & Plan:

## 2014-09-17 NOTE — Assessment & Plan Note (Addendum)
-  alpha 1 01/29/2013 >> MS (level 139)  -PFT 02/19/2013 PFTs FEV 1  0.91 (38%) with ratio 49 and 16% better p B2 -Spiro FEV 1 (39%) 03/05/2013  - med calendar 03/05/2013 > not following 06/09/13   Despite inability to follow calendar or master hfa, she's doing better on the symbicort 160 now and rarely needing saba / not needing any 02 (declines returning it) and no recent exac  Therefore All goals of chronic asthma control met including optimal function (though probably not nl) and elimination of symptoms with minimal need for rescue therapy.  Contingencies discussed in full including contacting this office immediately if not controlling the symptoms using the rule of two's.     The proper method of use, as well as anticipated side effects, of a metered-dose inhaler are discussed and demonstrated to the patient. Improved effectiveness after extensive coaching during this visit to a level of approximately  75% from a baseline of 25%     Each maintenance medication was reviewed in detail including most importantly the difference between maintenance and as needed and under what circumstances the prns are to be used.  Please see instructions for details which were reviewed in writing and the patient given a copy.

## 2014-10-11 DIAGNOSIS — J45901 Unspecified asthma with (acute) exacerbation: Secondary | ICD-10-CM | POA: Diagnosis not present

## 2014-10-11 DIAGNOSIS — Z79899 Other long term (current) drug therapy: Secondary | ICD-10-CM | POA: Diagnosis not present

## 2014-10-18 DIAGNOSIS — Z79899 Other long term (current) drug therapy: Secondary | ICD-10-CM | POA: Diagnosis not present

## 2014-10-18 DIAGNOSIS — J45909 Unspecified asthma, uncomplicated: Secondary | ICD-10-CM | POA: Diagnosis not present

## 2014-10-21 DIAGNOSIS — J45998 Other asthma: Secondary | ICD-10-CM | POA: Diagnosis not present

## 2014-11-07 DIAGNOSIS — Z0000 Encounter for general adult medical examination without abnormal findings: Secondary | ICD-10-CM | POA: Diagnosis not present

## 2014-11-07 DIAGNOSIS — Z79899 Other long term (current) drug therapy: Secondary | ICD-10-CM | POA: Diagnosis not present

## 2014-11-08 DIAGNOSIS — R109 Unspecified abdominal pain: Secondary | ICD-10-CM | POA: Diagnosis not present

## 2014-11-21 DIAGNOSIS — J45998 Other asthma: Secondary | ICD-10-CM | POA: Diagnosis not present

## 2014-12-19 ENCOUNTER — Encounter: Payer: Commercial Managed Care - HMO | Admitting: Adult Health

## 2014-12-20 DIAGNOSIS — J45998 Other asthma: Secondary | ICD-10-CM | POA: Diagnosis not present

## 2015-01-20 DIAGNOSIS — J45998 Other asthma: Secondary | ICD-10-CM | POA: Diagnosis not present

## 2015-02-19 DIAGNOSIS — J45998 Other asthma: Secondary | ICD-10-CM | POA: Diagnosis not present

## 2015-03-04 ENCOUNTER — Other Ambulatory Visit: Payer: Self-pay | Admitting: Adult Health

## 2015-03-22 DIAGNOSIS — J45998 Other asthma: Secondary | ICD-10-CM | POA: Diagnosis not present

## 2015-04-21 DIAGNOSIS — J45998 Other asthma: Secondary | ICD-10-CM | POA: Diagnosis not present

## 2015-05-08 DIAGNOSIS — J309 Allergic rhinitis, unspecified: Secondary | ICD-10-CM | POA: Diagnosis not present

## 2015-05-08 DIAGNOSIS — Z79899 Other long term (current) drug therapy: Secondary | ICD-10-CM | POA: Diagnosis not present

## 2015-05-08 DIAGNOSIS — J45909 Unspecified asthma, uncomplicated: Secondary | ICD-10-CM | POA: Diagnosis not present

## 2015-05-22 DIAGNOSIS — J45998 Other asthma: Secondary | ICD-10-CM | POA: Diagnosis not present

## 2015-06-22 DIAGNOSIS — J45998 Other asthma: Secondary | ICD-10-CM | POA: Diagnosis not present

## 2015-07-22 DIAGNOSIS — J45998 Other asthma: Secondary | ICD-10-CM | POA: Diagnosis not present

## 2015-08-22 DIAGNOSIS — J45998 Other asthma: Secondary | ICD-10-CM | POA: Diagnosis not present

## 2015-09-21 DIAGNOSIS — J45998 Other asthma: Secondary | ICD-10-CM | POA: Diagnosis not present

## 2015-10-22 DIAGNOSIS — J45998 Other asthma: Secondary | ICD-10-CM | POA: Diagnosis not present

## 2015-11-08 DIAGNOSIS — J45998 Other asthma: Secondary | ICD-10-CM | POA: Diagnosis not present

## 2015-11-17 DIAGNOSIS — J45901 Unspecified asthma with (acute) exacerbation: Secondary | ICD-10-CM | POA: Diagnosis not present

## 2015-11-22 DIAGNOSIS — J45998 Other asthma: Secondary | ICD-10-CM | POA: Diagnosis not present

## 2015-12-20 DIAGNOSIS — J45998 Other asthma: Secondary | ICD-10-CM | POA: Diagnosis not present

## 2016-01-20 DIAGNOSIS — J45998 Other asthma: Secondary | ICD-10-CM | POA: Diagnosis not present

## 2016-02-19 DIAGNOSIS — J45998 Other asthma: Secondary | ICD-10-CM | POA: Diagnosis not present

## 2016-03-21 DIAGNOSIS — J45998 Other asthma: Secondary | ICD-10-CM | POA: Diagnosis not present

## 2016-04-20 DIAGNOSIS — J45998 Other asthma: Secondary | ICD-10-CM | POA: Diagnosis not present

## 2016-05-10 DIAGNOSIS — Z79899 Other long term (current) drug therapy: Secondary | ICD-10-CM | POA: Diagnosis not present

## 2016-05-10 DIAGNOSIS — M21751 Unequal limb length (acquired), right femur: Secondary | ICD-10-CM | POA: Diagnosis not present

## 2016-05-10 DIAGNOSIS — Z0000 Encounter for general adult medical examination without abnormal findings: Secondary | ICD-10-CM | POA: Diagnosis not present

## 2016-05-10 DIAGNOSIS — E559 Vitamin D deficiency, unspecified: Secondary | ICD-10-CM | POA: Diagnosis not present

## 2016-05-10 DIAGNOSIS — R7309 Other abnormal glucose: Secondary | ICD-10-CM | POA: Diagnosis not present

## 2016-05-10 DIAGNOSIS — J45909 Unspecified asthma, uncomplicated: Secondary | ICD-10-CM | POA: Diagnosis not present

## 2016-05-21 DIAGNOSIS — J45998 Other asthma: Secondary | ICD-10-CM | POA: Diagnosis not present

## 2016-06-21 DIAGNOSIS — J45998 Other asthma: Secondary | ICD-10-CM | POA: Diagnosis not present

## 2016-07-12 ENCOUNTER — Emergency Department (HOSPITAL_COMMUNITY)
Admission: EM | Admit: 2016-07-12 | Discharge: 2016-07-13 | Disposition: A | Discharge status: Home / Self Care | Payer: Commercial Managed Care - HMO | Attending: Emergency Medicine | Admitting: Emergency Medicine

## 2016-07-12 ENCOUNTER — Encounter (HOSPITAL_COMMUNITY): Payer: Self-pay | Admitting: Emergency Medicine

## 2016-07-12 ENCOUNTER — Emergency Department (HOSPITAL_COMMUNITY): Payer: Commercial Managed Care - HMO

## 2016-07-12 DIAGNOSIS — Y939 Activity, unspecified: Secondary | ICD-10-CM | POA: Diagnosis not present

## 2016-07-12 DIAGNOSIS — Y929 Unspecified place or not applicable: Secondary | ICD-10-CM | POA: Insufficient documentation

## 2016-07-12 DIAGNOSIS — S0012XA Contusion of left eyelid and periocular area, initial encounter: Secondary | ICD-10-CM

## 2016-07-12 DIAGNOSIS — H05222 Edema of left orbit: Secondary | ICD-10-CM | POA: Diagnosis not present

## 2016-07-12 DIAGNOSIS — S0993XA Unspecified injury of face, initial encounter: Secondary | ICD-10-CM | POA: Diagnosis present

## 2016-07-12 DIAGNOSIS — Y999 Unspecified external cause status: Secondary | ICD-10-CM | POA: Diagnosis not present

## 2016-07-12 DIAGNOSIS — S0512XA Contusion of eyeball and orbital tissues, left eye, initial encounter: Secondary | ICD-10-CM | POA: Diagnosis not present

## 2016-07-12 DIAGNOSIS — H5713 Ocular pain, bilateral: Secondary | ICD-10-CM | POA: Diagnosis not present

## 2016-07-12 DIAGNOSIS — R000 Tachycardia, unspecified: Secondary | ICD-10-CM | POA: Diagnosis not present

## 2016-07-12 DIAGNOSIS — J45909 Unspecified asthma, uncomplicated: Secondary | ICD-10-CM | POA: Diagnosis not present
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
## 2016-07-13 NOTE — ED Notes (Signed)
EDP at bedside  

## 2016-07-21 DIAGNOSIS — J45998 Other asthma: Secondary | ICD-10-CM | POA: Diagnosis not present

## 2016-08-21 DIAGNOSIS — J45998 Other asthma: Secondary | ICD-10-CM | POA: Diagnosis not present

## 2016-09-20 DIAGNOSIS — J45998 Other asthma: Secondary | ICD-10-CM | POA: Diagnosis not present

## 2016-10-21 DIAGNOSIS — J45998 Other asthma: Secondary | ICD-10-CM | POA: Diagnosis not present

## 2016-11-07 DIAGNOSIS — J45998 Other asthma: Secondary | ICD-10-CM | POA: Diagnosis not present

## 2016-11-21 DIAGNOSIS — J45998 Other asthma: Secondary | ICD-10-CM | POA: Diagnosis not present

## 2016-12-05 DIAGNOSIS — J45998 Other asthma: Secondary | ICD-10-CM | POA: Diagnosis not present

## 2016-12-19 DIAGNOSIS — J45998 Other asthma: Secondary | ICD-10-CM | POA: Diagnosis not present

## 2017-01-19 DIAGNOSIS — J45998 Other asthma: Secondary | ICD-10-CM | POA: Diagnosis not present

## 2017-02-18 DIAGNOSIS — J45998 Other asthma: Secondary | ICD-10-CM | POA: Diagnosis not present

## 2017-03-21 DIAGNOSIS — J45998 Other asthma: Secondary | ICD-10-CM | POA: Diagnosis not present

## 2017-04-20 DIAGNOSIS — J45998 Other asthma: Secondary | ICD-10-CM | POA: Diagnosis not present

## 2017-04-30 DIAGNOSIS — J45909 Unspecified asthma, uncomplicated: Secondary | ICD-10-CM | POA: Diagnosis not present

## 2017-05-21 DIAGNOSIS — J45998 Other asthma: Secondary | ICD-10-CM | POA: Diagnosis not present

## 2017-06-21 DIAGNOSIS — J45998 Other asthma: Secondary | ICD-10-CM | POA: Diagnosis not present

## 2017-07-21 DIAGNOSIS — J45998 Other asthma: Secondary | ICD-10-CM | POA: Diagnosis not present

## 2017-08-21 DIAGNOSIS — J45998 Other asthma: Secondary | ICD-10-CM | POA: Diagnosis not present

## 2017-09-03 DIAGNOSIS — E559 Vitamin D deficiency, unspecified: Secondary | ICD-10-CM | POA: Diagnosis not present

## 2017-09-03 DIAGNOSIS — Z79899 Other long term (current) drug therapy: Secondary | ICD-10-CM | POA: Diagnosis not present

## 2017-09-03 DIAGNOSIS — E785 Hyperlipidemia, unspecified: Secondary | ICD-10-CM | POA: Diagnosis not present

## 2017-09-03 DIAGNOSIS — J45901 Unspecified asthma with (acute) exacerbation: Secondary | ICD-10-CM | POA: Diagnosis not present

## 2017-09-03 DIAGNOSIS — J45909 Unspecified asthma, uncomplicated: Secondary | ICD-10-CM | POA: Diagnosis not present

## 2017-09-03 DIAGNOSIS — Z1211 Encounter for screening for malignant neoplasm of colon: Secondary | ICD-10-CM | POA: Diagnosis not present

## 2017-09-03 DIAGNOSIS — Z0000 Encounter for general adult medical examination without abnormal findings: Secondary | ICD-10-CM | POA: Diagnosis not present

## 2017-09-03 DIAGNOSIS — M419 Scoliosis, unspecified: Secondary | ICD-10-CM | POA: Diagnosis not present

## 2017-09-20 DIAGNOSIS — J45998 Other asthma: Secondary | ICD-10-CM | POA: Diagnosis not present

## 2017-10-21 DIAGNOSIS — J45998 Other asthma: Secondary | ICD-10-CM | POA: Diagnosis not present

## 2017-11-21 DIAGNOSIS — J45998 Other asthma: Secondary | ICD-10-CM | POA: Diagnosis not present

## 2017-12-19 DIAGNOSIS — J45998 Other asthma: Secondary | ICD-10-CM | POA: Diagnosis not present

## 2018-01-19 DIAGNOSIS — J45998 Other asthma: Secondary | ICD-10-CM | POA: Diagnosis not present

## 2018-02-18 DIAGNOSIS — J45998 Other asthma: Secondary | ICD-10-CM | POA: Diagnosis not present

## 2018-03-04 DIAGNOSIS — J45909 Unspecified asthma, uncomplicated: Secondary | ICD-10-CM | POA: Diagnosis not present

## 2018-03-04 DIAGNOSIS — R7309 Other abnormal glucose: Secondary | ICD-10-CM | POA: Diagnosis not present

## 2018-03-04 DIAGNOSIS — E785 Hyperlipidemia, unspecified: Secondary | ICD-10-CM | POA: Diagnosis not present

## 2018-03-21 DIAGNOSIS — J45998 Other asthma: Secondary | ICD-10-CM | POA: Diagnosis not present

## 2018-04-20 DIAGNOSIS — J45998 Other asthma: Secondary | ICD-10-CM | POA: Diagnosis not present

## 2018-05-21 DIAGNOSIS — J45998 Other asthma: Secondary | ICD-10-CM | POA: Diagnosis not present

## 2018-06-21 DIAGNOSIS — J45998 Other asthma: Secondary | ICD-10-CM | POA: Diagnosis not present

## 2018-07-21 DIAGNOSIS — J45998 Other asthma: Secondary | ICD-10-CM | POA: Diagnosis not present

## 2018-08-21 DIAGNOSIS — J45998 Other asthma: Secondary | ICD-10-CM | POA: Diagnosis not present

## 2018-09-09 ENCOUNTER — Ambulatory Visit (INDEPENDENT_AMBULATORY_CARE_PROVIDER_SITE_OTHER): Payer: Medicare HMO

## 2018-09-09 ENCOUNTER — Ambulatory Visit (INDEPENDENT_AMBULATORY_CARE_PROVIDER_SITE_OTHER): Payer: Medicare HMO | Admitting: Nurse Practitioner

## 2018-09-09 ENCOUNTER — Other Ambulatory Visit: Payer: Self-pay | Admitting: Nurse Practitioner

## 2018-09-09 VITALS — BP 122/86 | HR 129 | Temp 98.5°F | Ht 59.0 in | Wt 86.4 lb

## 2018-09-09 DIAGNOSIS — Z0000 Encounter for general adult medical examination without abnormal findings: Secondary | ICD-10-CM

## 2018-09-09 DIAGNOSIS — Z0100 Encounter for examination of eyes and vision without abnormal findings: Secondary | ICD-10-CM | POA: Diagnosis not present

## 2018-09-09 DIAGNOSIS — J4521 Mild intermittent asthma with (acute) exacerbation: Secondary | ICD-10-CM

## 2018-09-09 DIAGNOSIS — J069 Acute upper respiratory infection, unspecified: Secondary | ICD-10-CM

## 2018-09-09 DIAGNOSIS — Z1231 Encounter for screening mammogram for malignant neoplasm of breast: Secondary | ICD-10-CM

## 2018-09-09 DIAGNOSIS — E559 Vitamin D deficiency, unspecified: Secondary | ICD-10-CM | POA: Diagnosis not present

## 2018-09-09 DIAGNOSIS — E785 Hyperlipidemia, unspecified: Secondary | ICD-10-CM | POA: Diagnosis not present

## 2018-09-09 MED ORDER — PREDNISONE 10 MG (21) PO TBPK: ORAL_TABLET | ORAL | 0 refills | Status: DC

## 2018-09-09 MED ORDER — AMOXICILLIN 875 MG PO TABS: 875.0000 mg | ORAL_TABLET | Freq: Two times a day (BID) | ORAL | 0 refills | Status: DC

## 2018-09-09 NOTE — Progress Notes (Signed)
Subjective:     Patient ID: Stacey Brock , female    DOB: 1960/12/12 , 57 y.o.   MRN: [id number]   Chief Complaint  Patient presents with  . Asthma    HPI  Asthma  She complains of chest tightness and cough. There is no difficulty breathing, shortness of breath or wheezing. This is a new problem. The current episode started in the past 7 days. The problem occurs intermittently. The problem has been gradually worsening. The cough is productive of sputum (yellow sputum). Associated symptoms include nasal congestion. Pertinent negatives include no appetite change, chest pain, fever, heartburn, malaise/fatigue or sore throat. Her symptoms are aggravated by URI and pollen. Her symptoms are alleviated by beta-agonist. Her symptoms are not alleviated by beta-agonist. There are no known risk factors for lung disease. Her past medical history is significant for asthma. There is no history of pneumonia.     Past Medical History:  Diagnosis Date  . Asthma   . Sinus trouble      Family History  Problem Relation Age of Onset  . Asthma Sister   . Cancer Father        colon     Current Outpatient Medications:  .  albuterol (PROAIR HFA) 108 (90 BASE) MCG/ACT inhaler, Inhale 2 puffs into the lungs every 4 (four) hours as needed for wheezing., Disp: 1 Inhaler, Rfl: 1 .  loratadine (CLARITIN) 10 MG tablet, Take 10 mg by mouth daily., Disp: , Rfl:  .  montelukast (SINGULAIR) 10 MG tablet, Take 10 mg by mouth every evening., Disp: , Rfl: 1 .  SYMBICORT 160-4.5 MCG/ACT inhaler, inhale 2 puffs by mouth twice a day, Disp: 10.2 g, Rfl: 1   No Known Allergies   Review of Systems  Constitutional: Negative for appetite change, fever and malaise/fatigue.  HENT: Negative for sore throat.   Respiratory: Positive for cough. Negative for shortness of breath and wheezing.   Cardiovascular: Negative for chest pain.  Gastrointestinal: Negative for heartburn.     Today's Vitals   09/09/18 1527  BP:  122/86  Pulse: (!) 129  Temp: 98.5 F (36.9 C)  TempSrc: Oral  Weight: 86 lb 6.4 oz (39.2 kg)  Height: '4\' 11"'$  (1.499 m)   Body mass index is 17.45 kg/m.   Objective:  Physical Exam Vitals signs reviewed.  Constitutional:      Appearance: Normal appearance.  Cardiovascular:     Rate and Rhythm: Tachycardia present.     Pulses: Normal pulses.     Heart sounds: Normal heart sounds. No murmur.  Pulmonary:     Effort: Pulmonary effort is normal.     Breath sounds: Examination of the right-upper field reveals wheezing. Examination of the left-upper field reveals wheezing. Examination of the right-lower field reveals wheezing. Examination of the left-lower field reveals wheezing. Wheezing present.  Skin:    General: Skin is warm and dry.  Neurological:     General: No focal deficit present.     Mental Status: She is alert.  Psychiatric:        Mood and Affect: Mood normal.         Assessment And Plan:     1. Mild intermittent asthma with acute exacerbation  She is wheezing today and has been using her albuterol inhaler multiple times per day  Tachycardia is likely related to the use of her albuterol inhaler - MM Digital Screening; Future - CBC no Diff - BMP8+eGFR  2. Encounter for eye exam -  Ambulatory referral to Ophthalmology  3. Encounter for screening mammogram for breast cancer  Filling gaps she did not go for her mammogram previously  Instructed on Self Breast Exam.According to ACOG guidelines Women aged 50 and older are recommended to get an annual mammogram.   Mammogram order placed and advised will receive a call from [facility] for appointment scheduling.   Encouraged to get annual mammogram - MM Digital Screening; Future - CBC no Diff - BMP8+eGFR  4. Upper respiratory tract infection, unspecified type  She has a congested cough and cold symptoms for the last 7 days  Will treat with amoxicillin and prednisone which will help her with her  asthma exacerbation - amoxicillin (AMOXIL) 875 MG tablet; Take 1 tablet (875 mg total) by mouth 2 (two) times daily.  Dispense: 14 tablet; Refill: 0 - predniSONE (STERAPRED UNI-PAK 21 TAB) 10 MG (21) TBPK tablet; Take as directed  Dispense: 21 tablet; Refill: 0  5. Vitamin D deficiency  Will check vitamin d level - Vitamin D (25 hydroxy)  6. Hyperlipidemia, unspecified hyperlipidemia type Chronic, controlled No current medications - Lipid panel       Minette Brine, FNP

## 2018-09-09 NOTE — Patient Instructions (Signed)
Health Maintenance, Female Adopting a healthy lifestyle and getting preventive care can go a long way to promote health and wellness. Talk with your health care provider about what schedule of regular examinations is right for you. This is a good chance for you to check in with your provider about disease prevention and staying healthy. In between checkups, there are plenty of things you can do on your own. Experts have done a lot of research about which lifestyle changes and preventive measures are most likely to keep you healthy. Ask your health care provider for more information. Weight and diet Eat a healthy diet  Be sure to include plenty of vegetables, fruits, low-fat dairy products, and lean protein.  Do not eat a lot of foods high in solid fats, added sugars, or salt.  Get regular exercise. This is one of the most important things you can do for your health. ? Most adults should exercise for at least 150 minutes each week. The exercise should increase your heart rate and make you sweat (moderate-intensity exercise). ? Most adults should also do strengthening exercises at least twice a week. This is in addition to the moderate-intensity exercise.  Maintain a healthy weight  Body mass index (BMI) is a measurement that can be used to identify possible weight problems. It estimates body fat based on height and weight. Your health care provider can help determine your BMI and help you achieve or maintain a healthy weight.  For females 20 years of age and older: ? A BMI below 18.5 is considered underweight. ? A BMI of 18.5 to 24.9 is normal. ? A BMI of 25 to 29.9 is considered overweight. ? A BMI of 30 and above is considered obese.  Watch levels of cholesterol and blood lipids  You should start having your blood tested for lipids and cholesterol at 59 years of age, then have this test every 5 years.  You may need to have your cholesterol levels checked more often if: ? Your lipid or  cholesterol levels are high. ? You are older than 59 years of age. ? You are at high risk for heart disease.  Cancer screening Lung Cancer  Lung cancer screening is recommended for adults 55-80 years old who are at high risk for lung cancer because of a history of smoking.  A yearly low-dose CT scan of the lungs is recommended for people who: ? Currently smoke. ? Have quit within the past 15 years. ? Have at least a 30-pack-year history of smoking. A pack year is smoking an average of one pack of cigarettes a day for 1 year.  Yearly screening should continue until it has been 15 years since you quit.  Yearly screening should stop if you develop a health problem that would prevent you from having lung cancer treatment.  Breast Cancer  Practice breast self-awareness. This means understanding how your breasts normally appear and feel.  It also means doing regular breast self-exams. Let your health care provider know about any changes, no matter how small.  If you are in your 20s or 30s, you should have a clinical breast exam (CBE) by a health care provider every 1-3 years as part of a regular health exam.  If you are 40 or older, have a CBE every year. Also consider having a breast X-ray (mammogram) every year.  If you have a family history of breast cancer, talk to your health care provider about genetic screening.  If you are at high risk   for breast cancer, talk to your health care provider about having an MRI and a mammogram every year.  Breast cancer gene (BRCA) assessment is recommended for women who have family members with BRCA-related cancers. BRCA-related cancers include: ? Breast. ? Ovarian. ? Tubal. ? Peritoneal cancers.  Results of the assessment will determine the need for genetic counseling and BRCA1 and BRCA2 testing.  Cervical Cancer Your health care provider may recommend that you be screened regularly for cancer of the pelvic organs (ovaries, uterus, and  vagina). This screening involves a pelvic examination, including checking for microscopic changes to the surface of your cervix (Pap test). You may be encouraged to have this screening done every 3 years, beginning at age 22.  For women ages 56-65, health care providers may recommend pelvic exams and Pap testing every 3 years, or they may recommend the Pap and pelvic exam, combined with testing for human papilloma virus (HPV), every 5 years. Some types of HPV increase your risk of cervical cancer. Testing for HPV may also be done on women of any age with unclear Pap test results.  Other health care providers may not recommend any screening for nonpregnant women who are considered low risk for pelvic cancer and who do not have symptoms. Ask your health care provider if a screening pelvic exam is right for you.  If you have had past treatment for cervical cancer or a condition that could lead to cancer, you need Pap tests and screening for cancer for at least 20 years after your treatment. If Pap tests have been discontinued, your risk factors (such as having a new sexual partner) need to be reassessed to determine if screening should resume. Some women have medical problems that increase the chance of getting cervical cancer. In these cases, your health care provider may recommend more frequent screening and Pap tests.  Colorectal Cancer  This type of cancer can be detected and often prevented.  Routine colorectal cancer screening usually begins at 59 years of age and continues through 59 years of age.  Your health care provider may recommend screening at an earlier age if you have risk factors for colon cancer.  Your health care provider may also recommend using home test kits to check for hidden blood in the stool.  A small camera at the end of a tube can be used to examine your colon directly (sigmoidoscopy or colonoscopy). This is done to check for the earliest forms of colorectal  cancer.  Routine screening usually begins at age 33.  Direct examination of the colon should be repeated every 5-10 years through 59 years of age. However, you may need to be screened more often if early forms of precancerous polyps or small growths are found.  Skin Cancer  Check your skin from head to toe regularly.  Tell your health care provider about any new moles or changes in moles, especially if there is a change in a mole's shape or color.  Also tell your health care provider if you have a mole that is larger than the size of a pencil eraser.  Always use sunscreen. Apply sunscreen liberally and repeatedly throughout the day.  Protect yourself by wearing long sleeves, pants, a wide-brimmed hat, and sunglasses whenever you are outside.  Heart disease, diabetes, and high blood pressure  High blood pressure causes heart disease and increases the risk of stroke. High blood pressure is more likely to develop in: ? People who have blood pressure in the high end of  the normal range (130-139/85-89 mm Hg). ? People who are overweight or obese. ? People who are African American.  If you are 21-29 years of age, have your blood pressure checked every 3-5 years. If you are 3 years of age or older, have your blood pressure checked every year. You should have your blood pressure measured twice-once when you are at a hospital or clinic, and once when you are not at a hospital or clinic. Record the average of the two measurements. To check your blood pressure when you are not at a hospital or clinic, you can use: ? An automated blood pressure machine at a pharmacy. ? A home blood pressure monitor.  If you are between 17 years and 37 years old, ask your health care provider if you should take aspirin to prevent strokes.  Have regular diabetes screenings. This involves taking a blood sample to check your fasting blood sugar level. ? If you are at a normal weight and have a low risk for diabetes,  have this test once every three years after 59 years of age. ? If you are overweight and have a high risk for diabetes, consider being tested at a younger age or more often. Preventing infection Hepatitis B  If you have a higher risk for hepatitis B, you should be screened for this virus. You are considered at high risk for hepatitis B if: ? You were born in a country where hepatitis B is common. Ask your health care provider which countries are considered high risk. ? Your parents were born in a high-risk country, and you have not been immunized against hepatitis B (hepatitis B vaccine). ? You have HIV or AIDS. ? You use needles to inject street drugs. ? You live with someone who has hepatitis B. ? You have had sex with someone who has hepatitis B. ? You get hemodialysis treatment. ? You take certain medicines for conditions, including cancer, organ transplantation, and autoimmune conditions.  Hepatitis C  Blood testing is recommended for: ? Everyone born from [birth year] through 1967. ? Anyone with known risk factors for hepatitis C.  Sexually transmitted infections (STIs)  You should be screened for sexually transmitted infections (STIs) including gonorrhea and chlamydia if: ? You are sexually active and are younger than 59 years of age. ? You are older than 59 years of age and your health care provider tells you that you are at risk for this type of infection. ? Your sexual activity has changed since you were last screened and you are at an increased risk for chlamydia or gonorrhea. Ask your health care provider if you are at risk.  If you do not have HIV, but are at risk, it may be recommended that you take a prescription medicine daily to prevent HIV infection. This is called pre-exposure prophylaxis (PrEP). You are considered at risk if: ? You are sexually active and do not regularly use condoms or know the HIV status of your partner(s). ? You take drugs by injection. ? You are  sexually active with a partner who has HIV.  Talk with your health care provider about whether you are at high risk of being infected with HIV. If you choose to begin PrEP, you should first be tested for HIV. You should then be tested every 3 months for as long as you are taking PrEP. Pregnancy  If you are premenopausal and you may become pregnant, ask your health care provider about preconception counseling.  If you may become  pregnant, take 400 to 800 micrograms (mcg) of folic acid every day.  If you want to prevent pregnancy, talk to your health care provider about birth control (contraception). Osteoporosis and menopause  Osteoporosis is a disease in which the bones lose minerals and strength with aging. This can result in serious bone fractures. Your risk for osteoporosis can be identified using a bone density scan.  If you are 17 years of age or older, or if you are at risk for osteoporosis and fractures, ask your health care provider if you should be screened.  Ask your health care provider whether you should take a calcium or vitamin D supplement to lower your risk for osteoporosis.  Menopause may have certain physical symptoms and risks.  Hormone replacement therapy may reduce some of these symptoms and risks. Talk to your health care provider about whether hormone replacement therapy is right for you. Follow these instructions at home:  Schedule regular health, dental, and eye exams.  Stay current with your immunizations.  Do not use any tobacco products including cigarettes, chewing tobacco, or electronic cigarettes.  If you are pregnant, do not drink alcohol.  If you are breastfeeding, limit how much and how often you drink alcohol.  Limit alcohol intake to no more than 1 drink per day for nonpregnant women. One drink equals 12 ounces of beer, 5 ounces of wine, or 1 ounces of hard liquor.  Do not use street drugs.  Do not share needles.  Ask your health care  provider for help if you need support or information about quitting drugs.  Tell your health care provider if you often feel depressed.  Tell your health care provider if you have ever been abused or do not feel safe at home. This information is not intended to replace advice given to you by your health care provider. Make sure you discuss any questions you have with your health care provider. Document Released: 04/08/2011 Document Revised: 02/29/2016 Document Reviewed: 06/27/2015 Elsevier Interactive Patient Education  2018 [employer].    Upper Respiratory Infection, Adult Most upper respiratory infections (URIs) are caused by a virus. A URI affects the nose, throat, and upper air passages. The most common type of URI is often called "the common cold." Follow these instructions at home:  Take medicines only as told by your doctor.  Gargle warm saltwater or take cough drops to comfort your throat as told by your doctor.  Use a warm mist humidifier or inhale steam from a shower to increase air moisture. This may make it easier to breathe.  Drink enough fluid to keep your pee (urine) clear or pale yellow.  Eat soups and other clear broths.  Have a healthy diet.  Rest as needed.  Go back to work when your fever is gone or your doctor says it is okay. ? You may need to stay home longer to avoid giving your URI to others. ? You can also wear a face mask and wash your hands often to prevent spread of the virus.  Use your inhaler more if you have asthma.  Do not use any tobacco products, including cigarettes, chewing tobacco, or electronic cigarettes. If you need help quitting, ask your doctor. Contact a doctor if:  You are getting worse, not better.  Your symptoms are not helped by medicine.  You have chills.  You are getting more short of breath.  You have brown or red mucus.  You have yellow or brown discharge from your nose.  You have pain in your face, especially  when you bend forward.  You have a fever.  You have puffy (swollen) neck glands.  You have pain while swallowing.  You have white areas in the back of your throat. Get help right away if:  You have very bad or constant: ? Headache. ? Ear pain. ? Pain in your forehead, behind your eyes, and over your cheekbones (sinus pain). ? Chest pain.  You have long-lasting (chronic) lung disease and any of the following: ? Wheezing. ? Long-lasting cough. ? Coughing up blood. ? A change in your usual mucus.  You have a stiff neck.  You have changes in your: ? Vision. ? Hearing. ? Thinking. ? Mood. This information is not intended to replace advice given to you by your health care provider. Make sure you discuss any questions you have with your health care provider. Document Released: 03/11/2008 Document Revised: 05/26/2016 Document Reviewed: 12/29/2013 Elsevier Interactive Patient Education  2018 [employer].  Asthma, Adult Asthma is a condition of the lungs in which the airways tighten and narrow. Asthma can make it hard to breathe. Asthma cannot be cured, but medicine and lifestyle changes can help control it. Asthma may be started (triggered) by:  Animal skin flakes (dander).  Dust.  Cockroaches.  Pollen.  Mold.  Smoke.  Cleaning products.  Hair sprays or aerosol sprays.  Paint fumes or strong smells.  Cold air, weather changes, and winds.  Crying or laughing hard.  Stress.  Certain medicines or drugs.  Foods, such as dried fruit, potato chips, and sparkling grape juice.  Infections or conditions (colds, flu).  Exercise.  Certain medical conditions or diseases.  Exercise or tiring activities.  Follow these instructions at home:  Take medicine as told by your doctor.  Use a peak flow meter as told by your doctor. A peak flow meter is a tool that measures how well the lungs are working.  Record and keep track of the peak flow meter's  readings.  Understand and use the asthma action plan. An asthma action plan is a written plan for taking care of your asthma and treating your attacks.  To help prevent asthma attacks: ? Do not smoke. Stay away from secondhand smoke. ? Change your heating and air conditioning filter often. ? Limit your use of fireplaces and wood stoves. ? Get rid of pests (such as roaches and mice) and their droppings. ? Throw away plants if you see mold on them. ? Clean your floors. Dust regularly. Use cleaning products that do not smell. ? Have someone vacuum when you are not home. Use a vacuum cleaner with a HEPA filter if possible. ? Replace carpet with wood, tile, or vinyl flooring. Carpet can trap animal skin flakes and dust. ? Use allergy-proof pillows, mattress covers, and box spring covers. ? Wash bed sheets and blankets every week in hot water and dry them in a dryer. ? Use blankets that are made of polyester or cotton. ? Clean bathrooms and kitchens with bleach. If possible, have someone repaint the walls in these rooms with mold-resistant paint. Keep out of the rooms that are being cleaned and painted. ? Wash hands often. Contact a doctor if:  You have make a whistling sound when breaking (wheeze), have shortness of breath, or have a cough even if taking medicine to prevent attacks.  The colored mucus you cough up (sputum) is thicker than usual.  The colored mucus you cough up changes from clear or white  to yellow, green, gray, or bloody.  You have problems from the medicine you are taking such as: ? A rash. ? Itching. ? Swelling. ? Trouble breathing.  You need reliever medicines more than 2-3 times a week.  Your peak flow measurement is still at 50-79% of your personal best after following the action plan for 1 hour.  You have a fever. Get help right away if:  You seem to be worse and are not responding to medicine during an asthma attack.  You are short of breath even at  rest.  You get short of breath when doing very little activity.  You have trouble eating, drinking, or talking.  You have chest pain.  You have a fast heartbeat.  Your lips or fingernails start to turn blue.  You are light-headed, dizzy, or faint.  Your peak flow is less than 50% of your personal best. This information is not intended to replace advice given to you by your health care provider. Make sure you discuss any questions you have with your health care provider. Document Released: 03/11/2008 Document Revised: 02/29/2016 Document Reviewed: 04/22/2013 Elsevier Interactive Patient Education  2017 [employer].

## 2018-09-09 NOTE — Patient Instructions (Signed)
Stacey Brock , Thank you for taking time to come for your Medicare Wellness Visit. I appreciate your ongoing commitment to your health goals. Please review the following plan we discussed and let me know if I can assist you in the future.   Screening recommendations/referrals: Colonoscopy: refused Mammogram: refused Bone Density: 02/2013 Recommended yearly ophthalmology/optometry visit for glaucoma screening and checkup Recommended yearly dental visit for hygiene and checkup  Vaccinations: Influenza vaccine: 06/2018 Pneumococcal vaccine: 06/2013 Tdap vaccine: refused Shingles vaccine: refused    Advanced directives: Advance directive discussed with you today. Even though you declined this today please call our office should you change your mind and we can give you the proper paperwork for you to fill out.   Conditions/risks identified: Tachycardia: To discuss with PCP in appointment scheduled after this   Next appointment: 03/11/2019 at 3:00p  Preventive Care 40-64 Years, Female Preventive care refers to lifestyle choices and visits with your health care provider that can promote health and wellness. What does preventive care include?  A yearly physical exam. This is also called an annual well check.  Dental exams once or twice a year.  Routine eye exams. Ask your health care provider how often you should have your eyes checked.  Personal lifestyle choices, including:  Daily care of your teeth and gums.  Regular physical activity.  Eating a healthy diet.  Avoiding tobacco and drug use.  Limiting alcohol use.  Practicing safe sex.  Taking low-dose aspirin daily starting at age 67.  Taking vitamin and mineral supplements as recommended by your health care provider. What happens during an annual well check? The services and screenings done by your health care provider during your annual well check will depend on your age, overall health, lifestyle risk factors, and family  history of disease. Counseling  Your health care provider may ask you questions about your:  Alcohol use.  Tobacco use.  Drug use.  Emotional well-being.  Home and relationship well-being.  Sexual activity.  Eating habits.  Work and work Statistician.  Method of birth control.  Menstrual cycle.  Pregnancy history. Screening  You may have the following tests or measurements:  Height, weight, and BMI.  Blood pressure.  Lipid and cholesterol levels. These may be checked every 5 years, or more frequently if you are over 96 years old.  Skin check.  Lung cancer screening. You may have this screening every year starting at age 81 if you have a 30-pack-year history of smoking and currently smoke or have quit within the past 15 years.  Fecal occult blood test (FOBT) of the stool. You may have this test every year starting at age 68.  Flexible sigmoidoscopy or colonoscopy. You may have a sigmoidoscopy every 5 years or a colonoscopy every 10 years starting at age 54.  Hepatitis C blood test.  Hepatitis B blood test.  Sexually transmitted disease (STD) testing.  Diabetes screening. This is done by checking your blood sugar (glucose) after you have not eaten for a while (fasting). You may have this done every 1-3 years.  Mammogram. This may be done every 1-2 years. Talk to your health care provider about when you should start having regular mammograms. This may depend on whether you have a family history of breast cancer.  BRCA-related cancer screening. This may be done if you have a family history of breast, ovarian, tubal, or peritoneal cancers.  Pelvic exam and Pap test. This may be done every 3 years starting at age 30. Starting  at age 41, this may be done every 5 years if you have a Pap test in combination with an HPV test.  Bone density scan. This is done to screen for osteoporosis. You may have this scan if you are at high risk for osteoporosis. Discuss your test  results, treatment options, and if necessary, the need for more tests with your health care provider. Vaccines  Your health care provider may recommend certain vaccines, such as:  Influenza vaccine. This is recommended every year.  Tetanus, diphtheria, and acellular pertussis (Tdap, Td) vaccine. You may need a Td booster every 10 years.  Zoster vaccine. You may need this after age 74.  Pneumococcal 13-valent conjugate (PCV13) vaccine. You may need this if you have certain conditions and were not previously vaccinated.  Pneumococcal polysaccharide (PPSV23) vaccine. You may need one or two doses if you smoke cigarettes or if you have certain conditions. Talk to your health care provider about which screenings and vaccines you need and how often you need them. This information is not intended to replace advice given to you by your health care provider. Make sure you discuss any questions you have with your health care provider. Document Released: 10/20/2015 Document Revised: 06/12/2016 Document Reviewed: 07/25/2015 Elsevier Interactive Patient Education  2017 [employer] Prevention in the Home Falls can cause injuries. They can happen to people of all ages. There are many things you can do to make your home safe and to help prevent falls. What can I do on the outside of my home?  Regularly fix the edges of walkways and driveways and fix any cracks.  Remove anything that might make you trip as you walk through a door, such as a raised step or threshold.  Trim any bushes or trees on the path to your home.  Use bright outdoor lighting.  Clear any walking paths of anything that might make someone trip, such as rocks or tools.  Regularly check to see if handrails are loose or broken. Make sure that both sides of any steps have handrails.  Any raised decks and porches should have guardrails on the edges.  Have any leaves, snow, or ice cleared regularly.  Use sand or salt on  walking paths during winter.  Clean up any spills in your garage right away. This includes oil or grease spills. What can I do in the bathroom?  Use night lights.  Install grab bars by the toilet and in the tub and shower. Do not use towel bars as grab bars.  Use non-skid mats or decals in the tub or shower.  If you need to sit down in the shower, use a plastic, non-slip stool.  Keep the floor dry. Clean up any water that spills on the floor as soon as it happens.  Remove soap buildup in the tub or shower regularly.  Attach bath mats securely with double-sided non-slip rug tape.  Do not have throw rugs and other things on the floor that can make you trip. What can I do in the bedroom?  Use night lights.  Make sure that you have a light by your bed that is easy to reach.  Do not use any sheets or blankets that are too big for your bed. They should not hang down onto the floor.  Have a firm chair that has side arms. You can use this for support while you get dressed.  Do not have throw rugs and other things on the floor  that can make you trip. What can I do in the kitchen?  Clean up any spills right away.  Avoid walking on wet floors.  Keep items that you use a lot in easy-to-reach places.  If you need to reach something above you, use a strong step stool that has a grab bar.  Keep electrical cords out of the way.  Do not use floor polish or wax that makes floors slippery. If you must use wax, use non-skid floor wax.  Do not have throw rugs and other things on the floor that can make you trip. What can I do with my stairs?  Do not leave any items on the stairs.  Make sure that there are handrails on both sides of the stairs and use them. Fix handrails that are broken or loose. Make sure that handrails are as long as the stairways.  Check any carpeting to make sure that it is firmly attached to the stairs. Fix any carpet that is loose or worn.  Avoid having throw  rugs at the top or bottom of the stairs. If you do have throw rugs, attach them to the floor with carpet tape.  Make sure that you have a light switch at the top of the stairs and the bottom of the stairs. If you do not have them, ask someone to add them for you. What else can I do to help prevent falls?  Wear shoes that:  Do not have high heels.  Have rubber bottoms.  Are comfortable and fit you well.  Are closed at the toe. Do not wear sandals.  If you use a stepladder:  Make sure that it is fully opened. Do not climb a closed stepladder.  Make sure that both sides of the stepladder are locked into place.  Ask someone to hold it for you, if possible.  Clearly mark and make sure that you can see:  Any grab bars or handrails.  First and last steps.  Where the edge of each step is.  Use tools that help you move around (mobility aids) if they are needed. These include:  Canes.  Walkers.  Scooters.  Crutches.  Turn on the lights when you go into a dark area. Replace any light bulbs as soon as they burn out.  Set up your furniture so you have a clear path. Avoid moving your furniture around.  If any of your floors are uneven, fix them.  If there are any pets around you, be aware of where they are.  Review your medicines with your doctor. Some medicines can make you feel dizzy. This can increase your chance of falling. Ask your doctor what other things that you can do to help prevent falls. This information is not intended to replace advice given to you by your health care provider. Make sure you discuss any questions you have with your health care provider. Document Released: 07/20/2009 Document Revised: 02/29/2016 Document Reviewed: 10/28/2014 Elsevier Interactive Patient Education  2017 [employer].
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
## 2018-09-10 ENCOUNTER — Other Ambulatory Visit: Payer: Self-pay | Admitting: Nurse Practitioner

## 2018-09-10 DIAGNOSIS — E785 Hyperlipidemia, unspecified: Secondary | ICD-10-CM

## 2018-09-10 DIAGNOSIS — E559 Vitamin D deficiency, unspecified: Secondary | ICD-10-CM

## 2018-09-10 LAB — BMP8+EGFR
BUN/Creatinine Ratio: 16 (ref 9–23)
BUN: 11 mg/dL (ref 6–24)
CO2: 24 mmol/L (ref 20–29)
Calcium: 9.5 mg/dL (ref 8.7–10.2)
Chloride: 98 mmol/L (ref 96–106)
Creatinine, Ser: 0.68 mg/dL (ref 0.57–1.00)
GFR calc non Af Amer: 96 mL/min/{1.73_m2} (ref >59)
GFR, EST AFRICAN AMERICAN: 111 mL/min/{1.73_m2} (ref >59)
Glucose: 84 mg/dL (ref 65–99)
Potassium: 4 mmol/L (ref 3.5–5.2)
Sodium: 142 mmol/L (ref 134–144)

## 2018-09-10 LAB — LIPID PANEL
Chol/HDL Ratio: 3.7 ratio (ref 0.0–4.4)
Cholesterol, Total: 300 mg/dL — ABNORMAL HIGH (ref 100–199)
HDL: 81 mg/dL (ref >39)
LDL Calculated: 206 mg/dL — ABNORMAL HIGH (ref 0–99)
Triglycerides: 65 mg/dL (ref 0–149)
VLDL Cholesterol Cal: 13 mg/dL (ref 5–40)

## 2018-09-10 LAB — CBC
Hematocrit: 38.1 % (ref 34.0–46.6)
Hemoglobin: 12.7 g/dL (ref 11.1–15.9)
MCH: 28.7 pg (ref 26.6–33.0)
MCHC: 33.3 g/dL (ref 31.5–35.7)
MCV: 86 fL (ref 79–97)
Platelets: 376 10*3/uL (ref 150–450)
RBC: 4.43 x10E6/uL (ref 3.77–5.28)
RDW: 13.2 % (ref 12.3–15.4)
WBC: 6.2 10*3/uL (ref 3.4–10.8)

## 2018-09-10 LAB — VITAMIN D 25 HYDROXY (VIT D DEFICIENCY, FRACTURES): Vit D, 25-Hydroxy: 12.9 ng/mL — ABNORMAL LOW (ref 30.0–100.0)

## 2018-09-10 MED ORDER — ATORVASTATIN CALCIUM 10 MG PO TABS: 10.0000 mg | ORAL_TABLET | Freq: Every day | ORAL | 11 refills | Status: DC

## 2018-09-10 MED ORDER — VITAMIN D (ERGOCALCIFEROL) 1.25 MG (50000 UNIT) PO CAPS: 50000.0000 [IU] | ORAL_CAPSULE | ORAL | 3 refills | Status: DC

## 2018-09-20 DIAGNOSIS — J45998 Other asthma: Secondary | ICD-10-CM | POA: Diagnosis not present

## 2018-09-27 ENCOUNTER — Encounter: Payer: Self-pay | Admitting: Nurse Practitioner

## 2018-10-21 DIAGNOSIS — J45998 Other asthma: Secondary | ICD-10-CM | POA: Diagnosis not present

## 2018-11-21 DIAGNOSIS — J45998 Other asthma: Secondary | ICD-10-CM | POA: Diagnosis not present

## 2018-12-20 DIAGNOSIS — J45998 Other asthma: Secondary | ICD-10-CM | POA: Diagnosis not present

## 2019-01-20 DIAGNOSIS — J45998 Other asthma: Secondary | ICD-10-CM | POA: Diagnosis not present

## 2019-03-02 ENCOUNTER — Other Ambulatory Visit: Payer: Self-pay | Admitting: Nurse Practitioner

## 2019-03-11 ENCOUNTER — Other Ambulatory Visit: Payer: Self-pay

## 2019-03-11 ENCOUNTER — Ambulatory Visit (INDEPENDENT_AMBULATORY_CARE_PROVIDER_SITE_OTHER): Payer: Medicare HMO | Admitting: Nurse Practitioner

## 2019-03-11 ENCOUNTER — Encounter: Payer: Self-pay | Admitting: Nurse Practitioner

## 2019-03-11 ENCOUNTER — Ambulatory Visit: Payer: Medicare HMO | Admitting: Nurse Practitioner

## 2019-03-11 VITALS — BP 122/78 | HR 98 | Temp 98.5°F | Ht 59.0 in | Wt 89.6 lb

## 2019-03-11 DIAGNOSIS — E559 Vitamin D deficiency, unspecified: Secondary | ICD-10-CM | POA: Diagnosis not present

## 2019-03-11 DIAGNOSIS — J4520 Mild intermittent asthma, uncomplicated: Secondary | ICD-10-CM | POA: Diagnosis not present

## 2019-03-11 DIAGNOSIS — E785 Hyperlipidemia, unspecified: Secondary | ICD-10-CM | POA: Diagnosis not present

## 2019-03-11 DIAGNOSIS — F419 Anxiety disorder, unspecified: Secondary | ICD-10-CM

## 2019-03-11 MED ORDER — MAGNESIUM 250 MG PO TABS: 1.0000 | ORAL_TABLET | Freq: Every evening | ORAL | 1 refills | Status: DC

## 2019-03-11 MED ORDER — ALBUTEROL SULFATE HFA 108 (90 BASE) MCG/ACT IN AERS: 2.0000 | INHALATION_SPRAY | RESPIRATORY_TRACT | 1 refills | Status: DC | PRN

## 2019-03-11 MED ORDER — ATORVASTATIN CALCIUM 10 MG PO TABS: 10.0000 mg | ORAL_TABLET | Freq: Every day | ORAL | 1 refills | Status: DC

## 2019-03-11 MED ORDER — BUDESONIDE-FORMOTEROL FUMARATE 160-4.5 MCG/ACT IN AERO: 2.0000 | INHALATION_SPRAY | Freq: Two times a day (BID) | RESPIRATORY_TRACT | 6 refills | Status: DC

## 2019-03-11 MED ORDER — VITAMIN D (ERGOCALCIFEROL) 1.25 MG (50000 UNIT) PO CAPS: 50000.0000 [IU] | ORAL_CAPSULE | ORAL | 3 refills | Status: DC

## 2019-03-11 NOTE — Progress Notes (Signed)
Subjective:     Patient ID: Stacey Brock , female    DOB: 01-13-1961 , 58 y.o.   MRN: [id number]   Chief Complaint  Patient presents with  . Asthma    HPI  Asthma  There is no chest tightness. This is a chronic problem. The current episode started more than 1 year ago. The problem occurs intermittently. The problem has been unchanged. Pertinent negatives include no appetite change or chest pain. Her past medical history is significant for asthma.     Past Medical History:  Diagnosis Date  . Asthma   . Sinus trouble      Family History  Problem Relation Age of Onset  . Asthma Sister   . Cancer Father        colon     Current Outpatient Medications:  .  albuterol (PROAIR HFA) 108 (90 BASE) MCG/ACT inhaler, Inhale 2 puffs into the lungs every 4 (four) hours as needed for wheezing., Disp: 1 Inhaler, Rfl: 1 .  atorvastatin (LIPITOR) 10 MG tablet, Take 1 tablet (10 mg total) by mouth daily., Disp: 30 tablet, Rfl: 11 .  loratadine (CLARITIN) 10 MG tablet, Take 10 mg by mouth daily., Disp: , Rfl:  .  montelukast (SINGULAIR) 10 MG tablet, TAKE 1 TABLET BY MOUTH EVERY DAY IN THE EVENING, Disp: 90 tablet, Rfl: 1 .  SYMBICORT 160-4.5 MCG/ACT inhaler, inhale 2 puffs by mouth twice a day, Disp: 10.2 g, Rfl: 1 .  Vitamin D, Ergocalciferol, (DRISDOL) 1.25 MG (50000 UT) CAPS capsule, Take 1 capsule (50,000 Units total) by mouth 2 (two) times a week., Disp: 8 capsule, Rfl: 3   No Known Allergies   Review of Systems  Constitutional: Negative for appetite change.  Cardiovascular: Negative for chest pain.     Today's Vitals   03/11/19 1516  BP: 122/78  Pulse: 98  Temp: 98.5 F (36.9 C)  TempSrc: Oral  Weight: 89 lb 9.6 oz (40.6 kg)  Height: 4\' 11"  (1.499 m)  PainSc: 0-No pain   Body mass index is 18.1 kg/m.   Objective:  Physical Exam Vitals signs reviewed.  Constitutional:      Appearance: Normal appearance.  Cardiovascular:     Rate and Rhythm: Normal rate and regular  rhythm.     Pulses: Normal pulses.     Heart sounds: Normal heart sounds. No murmur.  Pulmonary:     Effort: Pulmonary effort is normal. No respiratory distress.     Breath sounds: Normal breath sounds.     Comments: She is doing better this visit with her breathing Skin:    General: Skin is warm and dry.     Capillary Refill: Capillary refill takes less than 2 seconds.  Neurological:     General: No focal deficit present.     Mental Status: She is alert and oriented to person, place, and time.         Assessment And Plan:  1. Mild intermittent asthma without complication  Continue with symbicort and albuterol inhaler as needed  I am setting her up with CCM and she is to call back with a phone number - Referral to Chronic Care Management Services - albuterol (PROAIR HFA) 108 (90 Base) MCG/ACT inhaler; Inhale 2 puffs into the lungs every 4 (four) hours as needed for wheezing.  Dispense: 3 Inhaler; Refill: 1 - budesonide-formoterol (SYMBICORT) 160-4.5 MCG/ACT inhaler; Inhale 2 puffs into the lungs 2 (two) times daily.  Dispense: 10.2 g; Refill: 6  2. Vitamin D  deficiency  Will check vitamin D level and supplement as needed.     Also encouraged to spend 15 minutes in the sun daily.  - Vitamin D (25 hydroxy) - Referral to Chronic Care Management Services - Vitamin D, Ergocalciferol, (DRISDOL) 1.25 MG (50000 UT) CAPS capsule; Take 1 capsule (50,000 Units total) by mouth 2 (two) times a week.  Dispense: 8 capsule; Refill: 3  3. Hyperlipidemia, unspecified hyperlipidemia type  Chronic, controlled  Continue with current medications - Lipid Profile - Referral to Chronic Care Management Services - atorvastatin (LIPITOR) 10 MG tablet; Take 1 tablet (10 mg total) by mouth daily.  Dispense: 90 tablet; Refill: 1  4. Anxiety  Reports feeling nervous at times  Advised her to take magnesium with evening meal to see if effective. - Magnesium 250 MG TABS; Take 1 tablet (250 mg total)  by mouth every evening.  Dispense: 90 tablet; Refill: 1   Stacey Brine, FNP    THE PATIENT IS ENCOURAGED TO PRACTICE SOCIAL DISTANCING DUE TO THE COVID-19 PANDEMIC.

## 2019-03-12 LAB — LIPID PANEL
Chol/HDL Ratio: 3.4 ratio (ref 0.0–4.4)
Cholesterol, Total: 234 mg/dL — ABNORMAL HIGH (ref 100–199)
HDL: 68 mg/dL (ref >39)
LDL Calculated: 152 mg/dL — ABNORMAL HIGH (ref 0–99)
Triglycerides: 69 mg/dL (ref 0–149)
VLDL Cholesterol Cal: 14 mg/dL (ref 5–40)

## 2019-03-12 LAB — VITAMIN D 25 HYDROXY (VIT D DEFICIENCY, FRACTURES): Vit D, 25-Hydroxy: 58.6 ng/mL (ref 30.0–100.0)

## 2019-03-24 ENCOUNTER — Ambulatory Visit: Payer: Self-pay

## 2019-03-24 NOTE — Chronic Care Management (AMB) (Signed)
  Care Management Note   Daizha L Klooster is a 58 y.o. year old female who is a primary care patient of Minette Brine, [place]. The CM team was consulted for assistance with chronic care management.  Referral received on 03/11/19 for chronic care management services. Provider notes indicated the patient planned to contact the provider office with contact number. Chart reviewed on 6/5, 6/10, and on today. To date no contact number has been provided by the patient. CCM SW will close referral due to inability to contact the patient.  Daneen Schick, BSW, CDP Social Worker, Certified Dementia Practitioner [place] [facility] [place] [phone]

## 2019-05-08 HISTORY — PX: FRACTURE SURGERY: SHX138

## 2019-05-18 DIAGNOSIS — S52572A Other intraarticular fracture of lower end of left radius, initial encounter for closed fracture: Secondary | ICD-10-CM | POA: Diagnosis not present

## 2019-05-18 DIAGNOSIS — M25532 Pain in left wrist: Secondary | ICD-10-CM | POA: Diagnosis not present

## 2019-05-19 DIAGNOSIS — G5602 Carpal tunnel syndrome, left upper limb: Secondary | ICD-10-CM | POA: Diagnosis not present

## 2019-05-19 DIAGNOSIS — S52572A Other intraarticular fracture of lower end of left radius, initial encounter for closed fracture: Secondary | ICD-10-CM | POA: Diagnosis not present

## 2019-05-24 DIAGNOSIS — M25642 Stiffness of left hand, not elsewhere classified: Secondary | ICD-10-CM | POA: Diagnosis not present

## 2019-05-24 DIAGNOSIS — M25532 Pain in left wrist: Secondary | ICD-10-CM | POA: Diagnosis not present

## 2019-05-24 DIAGNOSIS — S52572A Other intraarticular fracture of lower end of left radius, initial encounter for closed fracture: Secondary | ICD-10-CM | POA: Diagnosis not present

## 2019-05-24 DIAGNOSIS — M25432 Effusion, left wrist: Secondary | ICD-10-CM | POA: Diagnosis not present

## 2019-05-30 ENCOUNTER — Other Ambulatory Visit: Payer: Self-pay

## 2019-05-30 ENCOUNTER — Inpatient Hospital Stay (HOSPITAL_COMMUNITY)
Admission: EM | Admit: 2019-05-30 | Discharge: 2019-06-01 | DRG: 202 | Disposition: A | Discharge status: Home / Self Care | Payer: Medicare HMO | Attending: Family Medicine | Admitting: Family Medicine

## 2019-05-30 ENCOUNTER — Emergency Department (HOSPITAL_COMMUNITY): Payer: Medicare HMO

## 2019-05-30 ENCOUNTER — Encounter (HOSPITAL_COMMUNITY): Payer: Self-pay

## 2019-05-30 DIAGNOSIS — Z7951 Long term (current) use of inhaled steroids: Secondary | ICD-10-CM | POA: Diagnosis not present

## 2019-05-30 DIAGNOSIS — J4550 Severe persistent asthma, uncomplicated: Secondary | ICD-10-CM | POA: Diagnosis present

## 2019-05-30 DIAGNOSIS — J439 Emphysema, unspecified: Secondary | ICD-10-CM | POA: Diagnosis present

## 2019-05-30 DIAGNOSIS — I214 Non-ST elevation (NSTEMI) myocardial infarction: Secondary | ICD-10-CM

## 2019-05-30 DIAGNOSIS — J4551 Severe persistent asthma with (acute) exacerbation: Principal | ICD-10-CM | POA: Diagnosis present

## 2019-05-30 DIAGNOSIS — Z20828 Contact with and (suspected) exposure to other viral communicable diseases: Secondary | ICD-10-CM | POA: Diagnosis present

## 2019-05-30 DIAGNOSIS — R0689 Other abnormalities of breathing: Secondary | ICD-10-CM | POA: Diagnosis not present

## 2019-05-30 DIAGNOSIS — E785 Hyperlipidemia, unspecified: Secondary | ICD-10-CM

## 2019-05-30 DIAGNOSIS — Z825 Family history of asthma and other chronic lower respiratory diseases: Secondary | ICD-10-CM | POA: Diagnosis not present

## 2019-05-30 DIAGNOSIS — J4520 Mild intermittent asthma, uncomplicated: Secondary | ICD-10-CM

## 2019-05-30 DIAGNOSIS — R0902 Hypoxemia: Secondary | ICD-10-CM | POA: Diagnosis not present

## 2019-05-30 DIAGNOSIS — R778 Other specified abnormalities of plasma proteins: Secondary | ICD-10-CM

## 2019-05-30 DIAGNOSIS — I248 Other forms of acute ischemic heart disease: Secondary | ICD-10-CM | POA: Diagnosis present

## 2019-05-30 DIAGNOSIS — R000 Tachycardia, unspecified: Secondary | ICD-10-CM | POA: Insufficient documentation

## 2019-05-30 DIAGNOSIS — Z886 Allergy status to analgesic agent status: Secondary | ICD-10-CM | POA: Diagnosis not present

## 2019-05-30 DIAGNOSIS — T7611XA Adult physical abuse, suspected, initial encounter: Secondary | ICD-10-CM | POA: Diagnosis not present

## 2019-05-30 DIAGNOSIS — Z800 Family history of malignant neoplasm of digestive organs: Secondary | ICD-10-CM | POA: Diagnosis not present

## 2019-05-30 DIAGNOSIS — J45909 Unspecified asthma, uncomplicated: Secondary | ICD-10-CM | POA: Diagnosis not present

## 2019-05-30 DIAGNOSIS — R7989 Other specified abnormal findings of blood chemistry: Secondary | ICD-10-CM

## 2019-05-30 DIAGNOSIS — J4552 Severe persistent asthma with status asthmaticus: Secondary | ICD-10-CM

## 2019-05-30 DIAGNOSIS — R0602 Shortness of breath: Secondary | ICD-10-CM | POA: Diagnosis not present

## 2019-05-30 DIAGNOSIS — Z209 Contact with and (suspected) exposure to unspecified communicable disease: Secondary | ICD-10-CM | POA: Diagnosis not present

## 2019-05-30 DIAGNOSIS — T7491XA Unspecified adult maltreatment, confirmed, initial encounter: Secondary | ICD-10-CM | POA: Insufficient documentation

## 2019-05-30 DIAGNOSIS — R231 Pallor: Secondary | ICD-10-CM | POA: Diagnosis not present

## 2019-05-30 LAB — POCT I-STAT 7, (LYTES, BLD GAS, ICA,H+H)
Acid-Base Excess: 2 mmol/L (ref 0.0–2.0)
Bicarbonate: 27.7 mmol/L (ref 20.0–28.0)
Calcium, Ion: 1.17 mmol/L (ref 1.15–1.40)
HCT: 34 % — ABNORMAL LOW (ref 36.0–46.0)
Hemoglobin: 11.6 g/dL — ABNORMAL LOW (ref 12.0–15.0)
O2 Saturation: 96 %
Patient temperature: 99
Potassium: 3.7 mmol/L (ref 3.5–5.1)
Sodium: 138 mmol/L (ref 135–145)
TCO2: 29 mmol/L (ref 22–32)
pCO2 arterial: 50 mmHg — ABNORMAL HIGH (ref 32.0–48.0)
pH, Arterial: 7.352 (ref 7.350–7.450)
pO2, Arterial: 92 mmHg (ref 83.0–108.0)

## 2019-05-30 LAB — CBC
HCT: 32.4 % — ABNORMAL LOW (ref 36.0–46.0)
Hemoglobin: 10.3 g/dL — ABNORMAL LOW (ref 12.0–15.0)
MCH: 29.1 pg (ref 26.0–34.0)
MCHC: 31.8 g/dL (ref 30.0–36.0)
MCV: 91.5 fL (ref 80.0–100.0)
Platelets: 377 10*3/uL (ref 150–400)
RBC: 3.54 MIL/uL — ABNORMAL LOW (ref 3.87–5.11)
RDW: 13 % (ref 11.5–15.5)
WBC: 8.1 10*3/uL (ref 4.0–10.5)
nRBC: 0 % (ref 0.0–0.2)

## 2019-05-30 LAB — SARS CORONAVIRUS 2 BY RT PCR (HOSPITAL ORDER, PERFORMED IN ~~LOC~~ HOSPITAL LAB): SARS Coronavirus 2: NEGATIVE

## 2019-05-30 LAB — COMPREHENSIVE METABOLIC PANEL
ALT: 19 U/L (ref 0–44)
AST: 27 U/L (ref 15–41)
Albumin: 4.3 g/dL (ref 3.5–5.0)
Alkaline Phosphatase: 61 U/L (ref 38–126)
Anion gap: 12 (ref 5–15)
BUN: 7 mg/dL (ref 6–20)
CO2: 25 mmol/L (ref 22–32)
Calcium: 8.7 mg/dL — ABNORMAL LOW (ref 8.9–10.3)
Chloride: 99 mmol/L (ref 98–111)
Creatinine, Ser: 0.64 mg/dL (ref 0.44–1.00)
GFR calc Af Amer: >60 mL/min (ref >60)
GFR calc non Af Amer: >60 mL/min (ref >60)
Glucose, Bld: 191 mg/dL — ABNORMAL HIGH (ref 70–99)
Potassium: 3.6 mmol/L (ref 3.5–5.1)
Sodium: 136 mmol/L (ref 135–145)
Total Bilirubin: 0.9 mg/dL (ref 0.3–1.2)
Total Protein: 7.3 g/dL (ref 6.5–8.1)

## 2019-05-30 LAB — I-STAT CHEM 8, ED
BUN: 9 mg/dL (ref 6–20)
Calcium, Ion: 1.1 mmol/L — ABNORMAL LOW (ref 1.15–1.40)
Chloride: 100 mmol/L (ref 98–111)
Creatinine, Ser: 0.6 mg/dL (ref 0.44–1.00)
Glucose, Bld: 191 mg/dL — ABNORMAL HIGH (ref 70–99)
HCT: 37 % (ref 36.0–46.0)
Hemoglobin: 12.6 g/dL (ref 12.0–15.0)
Potassium: 3.8 mmol/L (ref 3.5–5.1)
Sodium: 138 mmol/L (ref 135–145)
TCO2: 29 mmol/L (ref 22–32)

## 2019-05-30 LAB — TROPONIN I (HIGH SENSITIVITY)
Troponin I (High Sensitivity): 65 ng/L — ABNORMAL HIGH (ref <18)
Troponin I (High Sensitivity): 89 ng/L — ABNORMAL HIGH (ref <18)

## 2019-05-30 LAB — CBC WITH DIFFERENTIAL/PLATELET
Abs Immature Granulocytes: 0.04 10*3/uL (ref 0.00–0.07)
Basophils Absolute: 0.1 10*3/uL (ref 0.0–0.1)
Basophils Relative: 1 %
Eosinophils Absolute: 0.2 10*3/uL (ref 0.0–0.5)
Eosinophils Relative: 2 %
HCT: 38.7 % (ref 36.0–46.0)
Hemoglobin: 11.9 g/dL — ABNORMAL LOW (ref 12.0–15.0)
Immature Granulocytes: 1 %
Lymphocytes Relative: 32 %
Lymphs Abs: 2.8 10*3/uL (ref 0.7–4.0)
MCH: 28.7 pg (ref 26.0–34.0)
MCHC: 30.7 g/dL (ref 30.0–36.0)
MCV: 93.3 fL (ref 80.0–100.0)
Monocytes Absolute: 0.8 10*3/uL (ref 0.1–1.0)
Monocytes Relative: 10 %
Neutro Abs: 4.7 10*3/uL (ref 1.7–7.7)
Neutrophils Relative %: 54 %
Platelets: 500 10*3/uL — ABNORMAL HIGH (ref 150–400)
RBC: 4.15 MIL/uL (ref 3.87–5.11)
RDW: 13 % (ref 11.5–15.5)
WBC: 8.6 10*3/uL (ref 4.0–10.5)
nRBC: 0 % (ref 0.0–0.2)

## 2019-05-30 LAB — POCT I-STAT EG7
Bicarbonate: 29.1 mmol/L — ABNORMAL HIGH (ref 20.0–28.0)
Calcium, Ion: 1.13 mmol/L — ABNORMAL LOW (ref 1.15–1.40)
HCT: 38 % (ref 36.0–46.0)
Hemoglobin: 12.9 g/dL (ref 12.0–15.0)
O2 Saturation: 50 %
Potassium: 3.7 mmol/L (ref 3.5–5.1)
Sodium: 139 mmol/L (ref 135–145)
TCO2: 31 mmol/L (ref 22–32)
pCO2, Ven: 68.7 mmHg — ABNORMAL HIGH (ref 44.0–60.0)
pH, Ven: 7.236 — ABNORMAL LOW (ref 7.250–7.430)
pO2, Ven: 32 mmHg (ref 32.0–45.0)

## 2019-05-30 LAB — PROTIME-INR
INR: 1 (ref 0.8–1.2)
Prothrombin Time: 13.4 seconds (ref 11.4–15.2)

## 2019-05-30 LAB — LACTIC ACID, PLASMA
Lactic Acid, Venous: 1.1 mmol/L (ref 0.5–1.9)
Lactic Acid, Venous: 2.7 mmol/L (ref 0.5–1.9)

## 2019-05-30 LAB — BRAIN NATRIURETIC PEPTIDE: B Natriuretic Peptide: 89.6 pg/mL (ref 0.0–100.0)

## 2019-05-30 MED ORDER — FLUTICASONE FUROATE-VILANTEROL 200-25 MCG/INH IN AEPB
1.0000 | INHALATION_SPRAY | Freq: Every day | RESPIRATORY_TRACT | Status: DC
  Filled 2019-05-30: qty 28

## 2019-05-30 MED ORDER — ALBUTEROL SULFATE (2.5 MG/3ML) 0.083% IN NEBU: 2.5000 mg | INHALATION_SOLUTION | RESPIRATORY_TRACT | Status: DC | PRN

## 2019-05-30 MED ORDER — SODIUM CHLORIDE 0.9 % IV SOLN
Freq: Once | INTRAVENOUS | Status: AC
  Administered 2019-05-30: 19:00:00 via INTRAVENOUS

## 2019-05-30 MED ORDER — LORATADINE 10 MG PO TABS
10.0000 mg | ORAL_TABLET | Freq: Every day | ORAL | Status: DC
  Administered 2019-05-31 (×2): 10 mg via ORAL
  Filled 2019-05-30 (×2): qty 1

## 2019-05-30 MED ORDER — IPRATROPIUM BROMIDE HFA 17 MCG/ACT IN AERS
2.0000 | INHALATION_SPRAY | Freq: Once | RESPIRATORY_TRACT | Status: DC
  Filled 2019-05-30: qty 12.9

## 2019-05-30 MED ORDER — IPRATROPIUM-ALBUTEROL 0.5-2.5 (3) MG/3ML IN SOLN
3.0000 mL | Freq: Four times a day (QID) | RESPIRATORY_TRACT | Status: DC
  Administered 2019-05-30 – 2019-05-31 (×3): 3 mL via RESPIRATORY_TRACT
  Filled 2019-05-30 (×3): qty 3

## 2019-05-30 MED ORDER — METHYLPREDNISOLONE SODIUM SUCC 125 MG IJ SOLR
INTRAMUSCULAR | Status: AC
  Administered 2019-05-30: 125 mg
  Filled 2019-05-30: qty 2

## 2019-05-30 MED ORDER — SODIUM CHLORIDE 0.9 % IV BOLUS
500.0000 mL | Freq: Once | INTRAVENOUS | Status: AC
  Administered 2019-05-30: 19:00:00 500 mL via INTRAVENOUS

## 2019-05-30 MED ORDER — IPRATROPIUM-ALBUTEROL 0.5-2.5 (3) MG/3ML IN SOLN
RESPIRATORY_TRACT | Status: AC
  Administered 2019-05-30: 3 mL via RESPIRATORY_TRACT
  Filled 2019-05-30: qty 3

## 2019-05-30 MED ORDER — ALBUTEROL SULFATE (2.5 MG/3ML) 0.083% IN NEBU
INHALATION_SOLUTION | RESPIRATORY_TRACT | Status: AC
  Administered 2019-05-30: 2.5 mg
  Filled 2019-05-30: qty 3

## 2019-05-30 MED ORDER — ATORVASTATIN CALCIUM 10 MG PO TABS
10.0000 mg | ORAL_TABLET | Freq: Every day | ORAL | Status: DC
  Administered 2019-05-31: 10 mg via ORAL
  Filled 2019-05-30: qty 1

## 2019-05-30 MED ORDER — MAGNESIUM SULFATE 2 GM/50ML IV SOLN
INTRAVENOUS | Status: AC
  Administered 2019-05-30: 2 g
  Filled 2019-05-30: qty 50

## 2019-05-30 MED ORDER — ACETAMINOPHEN 650 MG RE SUPP: 650.0000 mg | Freq: Four times a day (QID) | RECTAL | Status: DC | PRN

## 2019-05-30 MED ORDER — IPRATROPIUM-ALBUTEROL 0.5-2.5 (3) MG/3ML IN SOLN
3.0000 mL | Freq: Once | RESPIRATORY_TRACT | Status: AC
  Administered 2019-05-30: 3 mL via RESPIRATORY_TRACT
  Filled 2019-05-30: qty 3

## 2019-05-30 MED ORDER — PREDNISOLONE 5 MG PO TABS
40.0000 mg | ORAL_TABLET | Freq: Every day | ORAL | Status: DC
  Filled 2019-05-30: qty 8

## 2019-05-30 MED ORDER — PROPOFOL 1000 MG/100ML IV EMUL
INTRAVENOUS | Status: AC
  Filled 2019-05-30: qty 100

## 2019-05-30 MED ORDER — MONTELUKAST SODIUM 10 MG PO TABS
10.0000 mg | ORAL_TABLET | Freq: Every evening | ORAL | Status: DC
  Administered 2019-05-31: 10 mg via ORAL
  Filled 2019-05-30: qty 1

## 2019-05-30 MED ORDER — MAGNESIUM OXIDE 400 (241.3 MG) MG PO TABS: 200.0000 mg | ORAL_TABLET | Freq: Every evening | ORAL | Status: DC

## 2019-05-30 MED ORDER — ACETAMINOPHEN 325 MG PO TABS: 650.0000 mg | ORAL_TABLET | Freq: Four times a day (QID) | ORAL | Status: DC | PRN

## 2019-05-30 MED ORDER — ENOXAPARIN SODIUM 30 MG/0.3ML ~~LOC~~ SOLN
30.0000 mg | SUBCUTANEOUS | Status: DC
  Administered 2019-05-30: 30 mg via SUBCUTANEOUS
  Filled 2019-05-30: qty 0.3

## 2019-05-30 MED ORDER — VITAMIN D (ERGOCALCIFEROL) 1.25 MG (50000 UNIT) PO CAPS: 50000.0000 [IU] | ORAL_CAPSULE | ORAL | Status: DC

## 2019-05-30 MED ORDER — ALBUTEROL SULFATE HFA 108 (90 BASE) MCG/ACT IN AERS: 4.0000 | INHALATION_SPRAY | Freq: Once | RESPIRATORY_TRACT | Status: DC

## 2019-05-30 NOTE — ED Notes (Signed)
Shirlean Mylar, sister- [phone] for updates
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
## 2019-05-30 NOTE — ED Notes (Signed)
Sister Jenny Reichmann @ [phone]
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
## 2019-05-31 ENCOUNTER — Inpatient Hospital Stay (HOSPITAL_COMMUNITY): Payer: Medicare HMO

## 2019-05-31 ENCOUNTER — Encounter (HOSPITAL_COMMUNITY): Payer: Self-pay | Admitting: Cardiology

## 2019-05-31 ENCOUNTER — Other Ambulatory Visit: Payer: Self-pay

## 2019-05-31 DIAGNOSIS — J45909 Unspecified asthma, uncomplicated: Secondary | ICD-10-CM

## 2019-05-31 DIAGNOSIS — J4551 Severe persistent asthma with (acute) exacerbation: Principal | ICD-10-CM

## 2019-05-31 DIAGNOSIS — I214 Non-ST elevation (NSTEMI) myocardial infarction: Secondary | ICD-10-CM

## 2019-05-31 DIAGNOSIS — J4552 Severe persistent asthma with status asthmaticus: Secondary | ICD-10-CM

## 2019-05-31 LAB — HEMOGLOBIN A1C
Hgb A1c MFr Bld: 5.9 % — ABNORMAL HIGH (ref 4.8–5.6)
Mean Plasma Glucose: 122.63 mg/dL

## 2019-05-31 LAB — LACTIC ACID, PLASMA
Lactic Acid, Venous: 2.5 mmol/L (ref 0.5–1.9)
Lactic Acid, Venous: 2 mmol/L (ref 0.5–1.9)

## 2019-05-31 LAB — TROPONIN I (HIGH SENSITIVITY)
Troponin I (High Sensitivity): 330 ng/L (ref <18)
Troponin I (High Sensitivity): 286 ng/L (ref <18)

## 2019-05-31 LAB — CREATININE, SERUM
Creatinine, Ser: 0.61 mg/dL (ref 0.44–1.00)
GFR calc Af Amer: >60 mL/min (ref >60)
GFR calc non Af Amer: >60 mL/min (ref >60)

## 2019-05-31 LAB — HIV ANTIBODY (ROUTINE TESTING W REFLEX): HIV Screen 4th Generation wRfx: NONREACTIVE

## 2019-05-31 LAB — D-DIMER, QUANTITATIVE: D-Dimer, Quant: 1.11 ug/mL-FEU — ABNORMAL HIGH (ref 0.00–0.50)

## 2019-05-31 LAB — MAGNESIUM: Magnesium: 2.2 mg/dL (ref 1.7–2.4)

## 2019-05-31 MED ORDER — ENOXAPARIN SODIUM 30 MG/0.3ML ~~LOC~~ SOLN: 30.0000 mg | SUBCUTANEOUS | Status: DC

## 2019-05-31 MED ORDER — ENOXAPARIN SODIUM 40 MG/0.4ML ~~LOC~~ SOLN: 40.0000 mg | SUBCUTANEOUS | Status: DC

## 2019-05-31 MED ORDER — IOHEXOL 350 MG/ML SOLN
50.0000 mL | Freq: Once | INTRAVENOUS | Status: AC | PRN
  Administered 2019-05-31: 50 mL via INTRAVENOUS

## 2019-05-31 MED ORDER — ROSUVASTATIN CALCIUM 5 MG PO TABS
10.0000 mg | ORAL_TABLET | Freq: Every day | ORAL | Status: DC
  Administered 2019-05-31: 10 mg via ORAL
  Filled 2019-05-31: qty 2

## 2019-05-31 MED ORDER — PREDNISONE 20 MG PO TABS
40.0000 mg | ORAL_TABLET | Freq: Every day | ORAL | Status: DC
  Administered 2019-05-31: 40 mg via ORAL
  Filled 2019-05-31: qty 2

## 2019-05-31 MED ORDER — MONTELUKAST SODIUM 10 MG PO TABS
10.0000 mg | ORAL_TABLET | Freq: Every day | ORAL | Status: DC
  Administered 2019-05-31: 10 mg via ORAL
  Filled 2019-05-31: qty 1

## 2019-05-31 MED ORDER — SODIUM CHLORIDE 0.9 % IV BOLUS
1000.0000 mL | Freq: Once | INTRAVENOUS | Status: AC
  Administered 2019-05-31: 1000 mL via INTRAVENOUS

## 2019-05-31 MED ORDER — IPRATROPIUM-ALBUTEROL 0.5-2.5 (3) MG/3ML IN SOLN
3.0000 mL | Freq: Two times a day (BID) | RESPIRATORY_TRACT | Status: DC
  Filled 2019-05-31 (×2): qty 3

## 2019-05-31 MED ORDER — SODIUM CHLORIDE 0.9% FLUSH
3.0000 mL | Freq: Two times a day (BID) | INTRAVENOUS | Status: DC
  Administered 2019-05-31: 3 mL via INTRAVENOUS

## 2019-05-31 MED ORDER — IPRATROPIUM-ALBUTEROL 0.5-2.5 (3) MG/3ML IN SOLN: 3.0000 mL | RESPIRATORY_TRACT | Status: DC | PRN

## 2019-05-31 MED ORDER — IPRATROPIUM-ALBUTEROL 0.5-2.5 (3) MG/3ML IN SOLN
3.0000 mL | RESPIRATORY_TRACT | Status: DC
  Administered 2019-05-31 (×2): 3 mL via RESPIRATORY_TRACT
  Filled 2019-05-31 (×2): qty 3

## 2019-05-31 MED ORDER — PREDNISONE 20 MG PO TABS: 40.0000 mg | ORAL_TABLET | Freq: Every day | ORAL | Status: DC

## 2019-05-31 NOTE — ED Notes (Signed)
Family medicine paged and responded regarding downgrading pt from step-down bed to medsurg. Awaiting new orders.

## 2019-05-31 NOTE — Progress Notes (Signed)
Unable to swallow even small pills; states gets choked. Educated to put pill in applesause and swallow. Has previously been chewing pills up.

## 2019-05-31 NOTE — Consult Note (Addendum)
, [facility] Consultation:   Patient ID: AUDRIS DELZELL MRN: [id number]; DOB: August 28, 1961  Admit date: 05/30/2019 Date of Consult: 05/31/2019  Primary Care Provider: Glendale Chard, MD Primary Cardiologist: New to [place] Primary Electrophysiologist:  None    Patient Profile:   BILINDA CUBBERLEY is a 58 y.o. female with a hx of asthma who is being seen today for the evaluation of elevated troponin at the request of Dr. Gwendlyn Deutscher.  History of Present Illness:   Ms. Connel has a history of severe persistent asthma and right middle lobe syndrome.  She was brought in with shortness of breath and change in level of consciousness.  Notes indicate that the patient's symptoms began after she took ibuprofen for back pain.  Soon after taking the medication she started sweating and felt dizzy.  Later she developed shortness of breath.  She used her albuterol without any improvement.  She has a history of multiple asthma exacerbations but denied previous difficulty with ibuprofen.  Notes indicate that PFT report indicated COPD/emphysema.   High-sensitivity troponin was checked and initially was 65 but rose to 330.  She was hypoxic and tachycardic in the ED.  D-dimer was elevated but CTA of the chest showed no pulmonary embolism.  Her symptoms improved with IV Solu-Medrol oxide and duo nebs.  She was also treated with supplemental oxygen.  Today Ms. Yunk tells me that she has no prior cardiac history or any cardiac testing.  She denies smoking or alcohol use.  She denies a prior history of high cholesterol or ever being medicated for it.  She does not work.  She does walk on most days, often to the store.  She denies any chest discomfort or shortness of breath with activity.  She does admit to some chest pain across the top of her chest that occurred when she was using her arms in the latter part of last week.  Yesterday she developed upper back pain/tension/tightness.  She had a friend to rub her  back with lotion.  Another friend gave her an ibuprofen.  It was after the ibuprofen that she developed significant respiratory difficulty.  However, she does now admit that she had some mild shortness of breath with the back pain prior to the ibuprofen.  She has oxygen that she can use during asthma attacks as well as a nebulizer but she does not have any albuterol to go in the nebulizer.  She tried using the oxygen and an albuterol inhaler with no improvement in her symptoms.  When her friend noted significant respiratory difficulty and the patient turning "purple and white" she called 911.  The patient does note that she was also wheezing.  She greatly improved with treatment.  Today she says that she is back at her baseline.  High-sensitivity troponin was initially 65 and rose to 330.  BNP was 89.6.  D-dimer was elevated at 1.11.  Chest CTA showed no evidence of pulmonary embolism, no acute intrathoracic process, scarring in the lingula and right upper lobe, likely postinfectious/inflammatory.  There was atherosclerotic plaque in the aorta.  No mention of atherosclerosis in the coronary arteries.  Chest x-ray showed emphysema without acute cardiopulmonary findings.  Heart Pathway Score:     Past Medical History:  Diagnosis Date   Asthma    Sinus trouble     History reviewed. No pertinent surgical history.   Home Medications:  Prior to Admission medications   Medication Sig Start Date End Date Taking? Authorizing Provider  albuterol (PROAIR HFA) 108 (90 Base) MCG/ACT inhaler Inhale 2 puffs into the lungs every 4 (four) hours as needed for wheezing. 03/11/19  Yes Minette Brine, FNP  budesonide-formoterol [facility]) 160-4.5 MCG/ACT inhaler Inhale 2 puffs into the lungs 2 (two) times daily. Patient taking differently: Inhale 2 puffs into the lungs 2 (two) times daily as needed (shortness of breath).  03/11/19  Yes Minette Brine, FNP  loratadine (CLARITIN) 10 MG tablet Take 10 mg by mouth at  bedtime.    Yes [provider]  montelukast (SINGULAIR) 10 MG tablet TAKE 1 TABLET BY MOUTH EVERY DAY IN THE EVENING Patient taking differently: Take 10 mg by mouth at bedtime.  03/03/19  Yes Minette Brine, FNP  atorvastatin (LIPITOR) 10 MG tablet Take 1 tablet (10 mg total) by mouth daily. Patient not taking: Reported on 05/30/2019 03/11/19 03/10/20  Minette Brine, FNP  Magnesium 250 MG TABS Take 1 tablet (250 mg total) by mouth every evening. Patient not taking: Reported on 05/30/2019 03/11/19   Minette Brine, FNP  Vitamin D, Ergocalciferol, (DRISDOL) 1.25 MG (50000 UT) CAPS capsule Take 1 capsule (50,000 Units total) by mouth 2 (two) times a week. Patient not taking: Reported on 05/30/2019 03/11/19   Minette Brine, FNP    Inpatient Medications: Scheduled Meds:  enoxaparin (LOVENOX) injection  40 mg Subcutaneous Q24H   fluticasone furoate-vilanterol  1 puff Inhalation Daily   [START ON 06/01/2019] ipratropium-albuterol  3 mL Nebulization BID   loratadine  10 mg Oral Daily   montelukast  10 mg Oral QHS   predniSONE  40 mg Oral Q breakfast   Continuous Infusions:  PRN Meds: acetaminophen **OR** acetaminophen, albuterol  Allergies:    Allergies  Allergen Reactions   Ibuprofen Shortness Of Breath    Sweaty, tachy, dizziness, flushing     Social History:   Social History   Socioeconomic History   Marital status: Single    Spouse name: Not on file   Number of children: 0   Years of education: Not on file   Highest education level: Not on file  Occupational History   Occupation: disabled  Social Designer, fashion/clothing strain: Not hard at all   Food insecurity    Worry: Never true    Inability: Never true   Transportation needs    Medical: No    Non-medical: No  Tobacco Use   Smoking status: Never Smoker   Smokeless tobacco: Never Used  Substance and Sexual Activity   Alcohol use: No   Drug use: No   Sexual activity: Not Currently  Lifestyle     Physical activity    Days per week: 7 days    Minutes per session: 20 min   Stress: Very much  Relationships   Social connections    Talks on phone: Not on file    Gets together: Not on file    Attends religious service: Not on file    Active member of club or organization: Not on file    Attends meetings of clubs or organizations: Not on file    Relationship status: Not on file   Intimate partner violence    Fear of current or ex partner: No    Emotionally abused: No    Physically abused: No    Forced sexual activity: No  Other Topics Concern   Not on file  Social History Narrative   Not on file    Family History:    Family History  Problem Relation Age  of Onset   Asthma Sister    Cancer Father        colon     ROS:  Please see the history of present illness.   All other ROS reviewed and negative.     Physical Exam/Data:   Vitals:   05/31/19 0915 05/31/19 1023 05/31/19 1318 05/31/19 1334  BP: 115/85 119/80 (!) 140/118 130/70  Pulse: 81 98 91   Resp: (!) 21 20 20    Temp:  98.3 F (36.8 C) 98.4 F (36.9 C)   TempSrc:  Oral Oral   SpO2: 100% 100% 100%   Weight:  41.9 kg    Height:  5' (1.524 m)      Intake/Output Summary (Last 24 hours) at 05/31/2019 1542 Last data filed at 05/30/2019 2017 Gross per 24 hour  Intake 550 ml  Output --  Net 550 ml   Last 3 Weights 05/31/2019 05/30/2019 03/11/2019  Weight (lbs) 92 lb 6 oz 89 lb 8.1 oz 89 lb 9.6 oz  Weight (kg) 41.9 kg 40.6 kg 40.642 kg  Some encounter information is confidential and restricted. Go to Review Flowsheets activity to see all data.     Body mass index is 18.04 kg/m.  General:  Thin female, in no acute distress HEENT: normal Lymph: no adenopathy Neck: no JVD Endocrine:  No thryomegaly Vascular: No carotid bruits; FA pulses 2+ bilaterally without bruits  Cardiac:  normal S1, S2; RRR; no murmur  Lungs:  clear to auscultation bilaterally, no wheezing, rhonchi or rales  Abd: soft,  nontender, no hepatomegaly  Ext: no edema Musculoskeletal:  No deformities, ace wrap on left forearm. BUE and BLE strength normal and equal Skin: warm and dry  Neuro:  CNs 2-12 intact, no focal abnormalities noted Psych:  Normal affect   EKG:  The EKG was personally reviewed and demonstrates:  Normal sinus rhythm, 87 bpm, Minimal voltage criteria for LVH, may be normal variant Telemetry:  Telemetry was personally reviewed and demonstrates:  Sinus rhythm in the 80's-90's.   Relevant CV Studies:  Echocardiogram pending  Laboratory Data:  High Sensitivity Troponin:   Recent Labs  Lab 05/30/19 1809 05/30/19 2124 05/31/19 1119 05/31/19 1450  TROPONINIHS 65* 89* 330* 286*     Cardiac EnzymesNo results for input(s): TROPONINI in the last 168 hours. No results for input(s): TROPIPOC in the last 168 hours.  Chemistry Recent Labs  Lab 05/30/19 1809 05/30/19 1815 05/30/19 1816 05/30/19 1836 05/30/19 2330  NA 136 138 139 138  --   K 3.6 3.8 3.7 3.7  --   CL 99 100  --   --   --   CO2 25  --   --   --   --   GLUCOSE 191* 191*  --   --   --   BUN 7 9  --   --   --   CREATININE 0.64 0.60  --   --  0.61  CALCIUM 8.7*  --   --   --   --   GFRNONAA >60  --   --   --  >60  GFRAA >60  --   --   --  >60  ANIONGAP 12  --   --   --   --     Recent Labs  Lab 05/30/19 1809  PROT 7.3  ALBUMIN 4.3  AST 27  ALT 19  ALKPHOS 61  BILITOT 0.9   Hematology Recent Labs  Lab 05/30/19 1809  05/30/19 1816  05/30/19 1836 05/30/19 2330  WBC 8.6  --   --   --  8.1  RBC 4.15  --   --   --  3.54*  HGB 11.9*   < > 12.9 11.6* 10.3*  HCT 38.7   < > 38.0 34.0* 32.4*  MCV 93.3  --   --   --  91.5  MCH 28.7  --   --   --  29.1  MCHC 30.7  --   --   --  31.8  RDW 13.0  --   --   --  13.0  PLT 500*  --   --   --  377   < > = values in this interval not displayed.   BNP Recent Labs  Lab 05/30/19 1809  BNP 89.6    DDimer  Recent Labs  Lab 05/31/19 1119  DDIMER 1.11*      Radiology/Studies:  Ct Angio Chest Pe W Or Wo Contrast  Result Date: 05/31/2019 CLINICAL DATA:  PE suspected, positive D-dimer, chest pain last week gas or focal wall with dog 8 EXAM: CT ANGIOGRAPHY CHEST WITH CONTRAST TECHNIQUE: Multidetector CT imaging of the chest was performed using the standard protocol during bolus administration of intravenous contrast. Multiplanar CT image reconstructions and MIPs were obtained to evaluate the vascular anatomy. CONTRAST:  65mL OMNIPAQUE IOHEXOL 350 MG/ML SOLN COMPARISON:  Chest radiograph May 30, 2019 FINDINGS: Cardiovascular: Satisfactory opacification of the pulmonary arteries to the segmental level. No evidence of pulmonary embolism or pulmonary artery enlargement. Atherosclerotic plaque within the normal caliber aorta. Left vertebral artery arises directly from the aortic arch. Trace pericardial effusion. Heart is otherwise unremarkable. Mediastinum/Nodes: No enlarged hilar, mediastinal or axillary lymph nodes. Thyroid gland, trachea, and esophagus demonstrate no significant findings. Lungs/Pleura: Bandlike areas of scarring/architectural distortion with associated bronchiectasis in lingula and anterior segment right upper lobe. No consolidation, features of edema, pneumothorax, or effusion. No suspicious pulmonary nodules or masses. Upper Abdomen: No acute abnormalities present in the visualized portions of the upper abdomen. Musculoskeletal: Marked levocurvature of the thoracic spine and dextrocurvature of the included lumbar spine. Resultant chest wall deformity. Multilevel degenerative changes are present in the imaged portions of the spine. No other acute or significant osseous abnormalities. Review of the MIP images confirms the above findings. IMPRESSION: No evidence of pulmonary embolism. No acute intrathoracic process. Scarring in the lingula and right upper lobe, likely post infectious/postinflammatory Aortic Atherosclerosis (ICD10-I70.0).  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 Electronically Signed   By: Lovena Le M.D.   On: 05/31/2019 14:34   Dg Chest Port 1 View  Result Date: 05/30/2019 CLINICAL DATA:  Respiratory distress. EXAM: PORTABLE CHEST 1 VIEW COMPARISON:  06/09/2013 FINDINGS: Lungs are hyperexpanded. Stable asymmetric elevation right hemidiaphragm. The lungs are clear without focal pneumonia, edema, pneumothorax or pleural effusion. Interstitial markings are diffusely coarsened with chronic features. Cardiopericardial silhouette is at upper limits of normal for size. The visualized bony structures of the thorax are intact. Telemetry leads overlie the chest. IMPRESSION: Emphysema without acute cardiopulmonary findings. Electronically Signed   By: Misty Stanley M.D.   On: 05/30/2019 19:09    Assessment and Plan:   Elevated troponin- NSTEMI vs demand ischemia -In the setting of asthma exacerbation -High-sensitivity troponin was initially 65 and rose to 330 then 286.  BNP was 89.6.   -D-dimer was elevated at 1.11.  Chest CTA showed no evidence of pulmonary embolism, no acute intrathoracic process, scarring in the lingula  and right upper lobe, likely postinfectious/inflammatory.  There was atherosclerotic plaque in the aorta.  No mention of atherosclerosis in the coronary arteries.   -Chest x-ray showed emphysema without acute cardiopulmonary findings. -COVID-19 testing was negative. -Echocardiogram is pending -CVD risk factors include only hyperlipidemia.  Her A1c is 5.9 (prediabetes).  She denies ever smoking.  She is not aware of any family history of cardiac disease. -The patient initially presented with respiratory difficulty, wheezing and hypoxia, felt to be asthma exacerbation.   -Elevated troponin could be related to demand ischemia in setting of asthma exacerbation, however, her symptoms initially started with pain in her upper back with some mild shortness of breath.  This could  have been angina which then exacerbated her asthma. -Will plan for cardiac cath tomorrow to definitively evaluate her coronary arteries.   Hyperlipidemia -Total cholesterol was 300 in December 2019 with LDL of 206.  Lipid panel in 03/2019 showed total cholesterol 234 with LDL of 152, HDL 68. -Pt denies knowledge of high cholesterol and says she has not been on treatment. -Will start crestor 10 mg.   Asthma exacerbation -Patient with known history of severe asthma. -Presented with wheezing, dyspnea and hypoxia.  Significantly improved with IV steroids, bronchodilator and supplemental oxygen. Pt says that she is back to her baseline.      For questions or updates, please contact [place] Please consult [email or web address] for contact info under     Signed, Daune Perch, NP  05/31/2019 3:42 PM  Personally seen and examined. Agree with above.   58 yo female with asthma, chest pain/back pain, with hs trop increase to 300, tachycardia on admit, with no ST segment changes.   GEN: Thin, in no acute distress  HEENT: normal  Neck: no JVD, carotid bruits, or masses Cardiac: RRR; no murmurs, rubs, or gallops,no edema  Respiratory:  clear to auscultation bilaterally, normal work of breathing GI: soft, nontender, nondistended, + BS MS: no deformity or atrophy  Skin: warm and dry, no rash Neuro:  Alert and Oriented x 3, Strength and sensation are intact Psych: euthymic mood, full affect  Tele unremarkable.   ECHO pending  A/P  NSTEMI vs demand ischemia/ hyperlipidemia  - possible demand ischemia but strange history, back pain, significant hs trop increase. I would like to have her set up for heart cath to ensure that she does not have any CAD. Her LDL at one point was 206.  If cath is normal, OK to call this demand ischemia.   Ashtma  - Saw Dr. Melvyn Novas in 2015. Notes reviewed.   - Severe PFT's in the past.   - Tx per main team. She does feel better.   Candee Furbish, MD
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
## 2019-05-31 NOTE — Interval H&P Note (Signed)
Cath Lab Visit (complete for each Cath Lab visit)  Clinical Evaluation Leading to the Procedure:   ACS: Yes.    Non-ACS:    Anginal Classification: CCS Brock  Anti-ischemic medical therapy: Maximal Therapy (2 or more classes of medications)  Non-Invasive Test Results: No non-invasive testing performed  Prior CABG: No previous CABG      History and Physical Interval Note:  05/31/2019 8:42 PM  Stacey Brock  has presented today for surgery, with the diagnosis of non stemi.  The various methods of treatment have been discussed with the patient and family. After consideration of risks, benefits and other options for treatment, the patient has consented to  Procedure(s): LEFT HEART CATH AND CORONARY ANGIOGRAPHY (N/A) as a surgical intervention.  The patient's history has been reviewed, patient examined, no change in status, stable for surgery.  I have reviewed the patient's chart and labs.  Questions were answered to the patient's satisfaction.     Stacey Brock

## 2019-05-31 NOTE — ED Notes (Signed)
Sister updated on patient's condition and admission plan.

## 2019-05-31 NOTE — ED Notes (Signed)
ED TO INPATIENT HANDOFF REPORT  ED Nurse Name and Phone #: Hassan Rowan [phone]  S Name/Age/Gender Stacey Brock 58 y.o. female Room/Bed: TRACC/TRACC  Code Status   Code Status: Full Code  Home/SNF/Other Home Patient oriented to: self, place, time and situation Is this baseline? Yes   Triage Complete: Triage complete  Chief Complaint resp distre  Triage Note No notes on file   Allergies No Known Allergies  Level of Care/Admitting Diagnosis ED Disposition    ED Disposition Condition [facility] Area: [place] [[id number]]  Level of Care: Progressive [102]  Covid Evaluation: Confirmed COVID Negative  Diagnosis: Asthma [id number]  Admitting Physician: Gifford Shave [id number]  Attending Physician: Andrena Mews T [[id number]]  Estimated length of stay: past midnight tomorrow  Certification:: I certify this patient will need inpatient services for at least 2 midnights  PT Class (Do Not Modify): Inpatient [101]  PT Acc Code (Do Not Modify): Private [1]       B Medical/Surgery History Past Medical History:  Diagnosis Date  . Asthma   . Sinus trouble    History reviewed. No pertinent surgical history.   A IV Location/Drains/Wounds Patient Lines/Drains/Airways Status   Active Line/Drains/Airways    Name:   Placement date:   Placement time:   Site:   Days:   Peripheral IV 05/30/19 Posterior;Right Wrist   05/30/19    -    Wrist   1   Peripheral IV 05/30/19 Anterior;Proximal;Right Antecubital   05/30/19    1806    Antecubital   1   Urethral Catheter 14 Fr.   -    0703    -             Intake/Output Last 24 hours  Intake/Output Summary (Last 24 hours) at 05/31/2019 [id number] Last data filed at 05/30/2019 2017 Gross per 24 hour  Intake 550 ml  Output -  Net 550 ml    Labs/Imaging Results for orders placed or performed during the hospital encounter of 05/30/19 (from the past 48 hour(s))  Lactic acid, plasma     Status: None   Collection  Time: 05/30/19  6:06 PM  Result Value Ref Range   Lactic Acid, Venous 1.1 0.5 - 1.9 mmol/L    Comment: Performed at [facility], [address] [place], [place] [address]  Comprehensive metabolic panel     Status: Abnormal   Collection Time: 05/30/19  6:09 PM  Result Value Ref Range   Sodium 136 135 - 145 mmol/L   Potassium 3.6 3.5 - 5.1 mmol/L   Chloride 99 98 - 111 mmol/L   CO2 25 22 - 32 mmol/L   Glucose, Bld 191 (H) 70 - 99 mg/dL   BUN 7 6 - 20 mg/dL   Creatinine, Ser 0.64 0.44 - 1.00 mg/dL   Calcium 8.7 (L) 8.9 - 10.3 mg/dL   Total Protein 7.3 6.5 - 8.1 g/dL   Albumin 4.3 3.5 - 5.0 g/dL   AST 27 15 - 41 U/L   ALT 19 0 - 44 U/L   Alkaline Phosphatase 61 38 - 126 U/L   Total Bilirubin 0.9 0.3 - 1.2 mg/dL   GFR calc non Af Amer >60 >60 mL/min   GFR calc Af Amer >60 >60 mL/min   Anion gap 12 5 - 15    Comment: Performed at [facility] [address] [place], [place] [address]  Troponin I (High Sensitivity)  Status: Abnormal   Collection Time: 05/30/19  6:09 PM  Result Value Ref Range   Troponin I (High Sensitivity) 65 (H) <18 ng/L    Comment: (NOTE) Elevated high sensitivity troponin I (hsTnI) values and significant  changes across serial measurements may suggest ACS but many other  chronic and acute conditions are known to elevate hsTnI results.  Refer to the "Links" section for chest pain algorithms and additional  guidance. Performed at [facility], [place] [address], [place], [place] [address]   CBC with Differential     Status: Abnormal   Collection Time: 05/30/19  6:09 PM  Result Value Ref Range   WBC 8.6 4.0 - 10.5 K/uL   RBC 4.15 3.87 - 5.11 MIL/uL   Hemoglobin 11.9 (L) 12.0 - 15.0 g/dL   HCT 38.7 36.0 - 46.0 %   MCV 93.3 80.0 - 100.0 fL   MCH 28.7 26.0 - 34.0 pg   MCHC 30.7 30.0 - 36.0 g/dL   RDW 13.0 11.5 - 15.5 %   Platelets 500 (H) 150 - 400 K/uL   nRBC 0.0 0.0 - 0.2 %   Neutrophils Relative % 54 %   Neutro Abs 4.7 1.7 - 7.7  K/uL   Lymphocytes Relative 32 %   Lymphs Abs 2.8 0.7 - 4.0 K/uL   Monocytes Relative 10 %   Monocytes Absolute 0.8 0.1 - 1.0 K/uL   Eosinophils Relative 2 %   Eosinophils Absolute 0.2 0.0 - 0.5 K/uL   Basophils Relative 1 %   Basophils Absolute 0.1 0.0 - 0.1 K/uL   Immature Granulocytes 1 %   Abs Immature Granulocytes 0.04 0.00 - 0.07 K/uL    Comment: Performed at [facility] [address] [place], [place] [address]  Protime-INR     Status: None   Collection Time: 05/30/19  6:09 PM  Result Value Ref Range   Prothrombin Time 13.4 11.4 - 15.2 seconds   INR 1.0 0.8 - 1.2    Comment: (NOTE) INR goal varies based on device and disease states. Performed at [facility], [place] [address], [place], [place] [address]   Brain natriuretic peptide     Status: None   Collection Time: 05/30/19  6:09 PM  Result Value Ref Range   B Natriuretic Peptide 89.6 0.0 - 100.0 pg/mL    Comment: Performed at [facility] [address] [place],  [address]  I-stat chem 8, ED (not at [facility] or [facility])     Status: Abnormal   Collection Time: 05/30/19  6:15 PM  Result Value Ref Range   Sodium 138 135 - 145 mmol/L   Potassium 3.8 3.5 - 5.1 mmol/L   Chloride 100 98 - 111 mmol/L   BUN 9 6 - 20 mg/dL   Creatinine, Ser 0.60 0.44 - 1.00 mg/dL   Glucose, Bld 191 (H) 70 - 99 mg/dL   Calcium, Ion 1.10 (L) 1.15 - 1.40 mmol/L   TCO2 29 22 - 32 mmol/L   Hemoglobin 12.6 12.0 - 15.0 g/dL   HCT 37.0 36.0 - 46.0 %  SARS Coronavirus 2 [facility] order, Performed in [facility] hospital lab) Nasopharyngeal Nasopharyngeal Swab     Status: None   Collection Time: 05/30/19  6:15 PM   Specimen: Nasopharyngeal Swab  Result Value Ref Range   SARS Coronavirus 2 NEGATIVE NEGATIVE    Comment: (NOTE) If result is NEGATIVE SARS-CoV-2 target nucleic acids are NOT DETECTED. The SARS-CoV-2 RNA is generally  detectable in upper and lower  respiratory specimens during the acute phase of infection. The lowest   concentration of SARS-CoV-2 viral copies this assay can detect is 250  copies / mL. A negative result does not preclude SARS-CoV-2 infection  and should not be used as the sole basis for treatment or other  patient management decisions.  A negative result may occur with  improper specimen collection / handling, submission of specimen other  than nasopharyngeal swab, presence of viral mutation(s) within the  areas targeted by this assay, and inadequate number of viral copies  (<250 copies / mL). A negative result must be combined with clinical  observations, patient history, and epidemiological information. If result is POSITIVE SARS-CoV-2 target nucleic acids are DETECTED. The SARS-CoV-2 RNA is generally detectable in upper and lower  respiratory specimens dur ing the acute phase of infection.  Positive  results are indicative of active infection with SARS-CoV-2.  Clinical  correlation with patient history and other diagnostic information is  necessary to determine patient infection status.  Positive results do  not rule out bacterial infection or co-infection with other viruses. If result is PRESUMPTIVE POSTIVE SARS-CoV-2 nucleic acids MAY BE PRESENT.   A presumptive positive result was obtained on the submitted specimen  and confirmed on repeat testing.  While 2019 novel coronavirus  (SARS-CoV-2) nucleic acids may be present in the submitted sample  additional confirmatory testing may be necessary for epidemiological  and / or clinical management purposes  to differentiate between  SARS-CoV-2 and other Sarbecovirus currently known to infect humans.  If clinically indicated additional testing with an alternate test  methodology [phone]) is advised. The SARS-CoV-2 RNA is generally  detectable in upper and lower respiratory sp ecimens during the acute  phase of infection. The expected result is Negative. Fact Sheet for Patients:  StrictlyIdeas.no Fact Sheet  for Healthcare Providers: BankingDealers.co.za This test is not yet approved or cleared by the [place] FDA and has been authorized for detection and/or diagnosis of SARS-CoV-2 by FDA under an Emergency Use Authorization (EUA).  This EUA will remain in effect (meaning this test can be used) for the duration of the COVID-19 declaration under Section 564(b)(1) of the Act, 21 U.S.C. section 360bbb-3(b)(1), unless the authorization is terminated or revoked sooner. Performed at [facility], [place] [address], [place], [place] [address]   POCT I-Stat EG7     Status: Abnormal   Collection Time: 05/30/19  6:16 PM  Result Value Ref Range   pH, Ven 7.236 (L) 7.250 - 7.430   pCO2, Ven 68.7 (H) 44.0 - 60.0 mmHg   pO2, Ven 32.0 32.0 - 45.0 mmHg   Bicarbonate 29.1 (H) 20.0 - 28.0 mmol/L   TCO2 31 22 - 32 mmol/L   O2 Saturation 50.0 %   Sodium 139 135 - 145 mmol/L   Potassium 3.7 3.5 - 5.1 mmol/L   Calcium, Ion 1.13 (L) 1.15 - 1.40 mmol/L   HCT 38.0 36.0 - 46.0 %   Hemoglobin 12.9 12.0 - 15.0 g/dL   Patient temperature HIDE    Sample type VENOUS    Comment NOTIFIED PHYSICIAN   I-STAT 7, (LYTES, BLD GAS, ICA, H+H)     Status: Abnormal   Collection Time: 05/30/19  6:36 PM  Result Value Ref Range   pH, Arterial 7.352 7.350 - 7.450   pCO2 arterial 50.0 (H) 32.0 - 48.0 mmHg   pO2, Arterial 92.0 83.0 - 108.0 mmHg   Bicarbonate 27.7 20.0 - 28.0  mmol/L   TCO2 29 22 - 32 mmol/L   O2 Saturation 96.0 %   Acid-Base Excess 2.0 0.0 - 2.0 mmol/L   Sodium 138 135 - 145 mmol/L   Potassium 3.7 3.5 - 5.1 mmol/L   Calcium, Ion 1.17 1.15 - 1.40 mmol/L   HCT 34.0 (L) 36.0 - 46.0 %   Hemoglobin 11.6 (L) 12.0 - 15.0 g/dL   Patient temperature 99.0 F    Collection site RADIAL, ALLEN'S TEST ACCEPTABLE    Drawn by Operator    Sample type ARTERIAL   Lactic acid, plasma     Status: Abnormal   Collection Time: 05/30/19  9:24 PM  Result Value Ref Range   Lactic Acid, Venous 2.7  (HH) 0.5 - 1.9 mmol/L    Comment: CRITICAL RESULT CALLED TO, READ BACK BY AND VERIFIED WITH: [facility] RN 05/30/2019 2208 JORDANS Performed at [facility], [address], [place], [place] [address]   Troponin I (High Sensitivity)     Status: Abnormal   Collection Time: 05/30/19  9:24 PM  Result Value Ref Range   Troponin I (High Sensitivity) 89 (H) <18 ng/L    Comment: RESULT CALLED TO, READ BACK BY AND VERIFIED WITH: WARD,C RN 05/30/2019 2209 JORDANS (NOTE) Elevated high sensitivity troponin I (hsTnI) values and significant  changes across serial measurements may suggest ACS but many other  chronic and acute conditions are known to elevate hsTnI results.  Refer to the Links section for chest pain algorithms and additional  guidance. Performed at [facility], [place] [address] [place], [place] [address]   CBC     Status: Abnormal   Collection Time: 05/30/19 11:30 PM  Result Value Ref Range   WBC 8.1 4.0 - 10.5 K/uL   RBC 3.54 (L) 3.87 - 5.11 MIL/uL   Hemoglobin 10.3 (L) 12.0 - 15.0 g/dL   HCT 32.4 (L) 36.0 - 46.0 %   MCV 91.5 80.0 - 100.0 fL   MCH 29.1 26.0 - 34.0 pg   MCHC 31.8 30.0 - 36.0 g/dL   RDW 13.0 11.5 - 15.5 %   Platelets 377 150 - 400 K/uL   nRBC 0.0 0.0 - 0.2 %    Comment: Performed at [facility], [place] [address] [place], [place] [address]  Creatinine, serum     Status: None   Collection Time: 05/30/19 11:30 PM  Result Value Ref Range   Creatinine, Ser 0.61 0.44 - 1.00 mg/dL   GFR calc non Af Amer >60 >60 mL/min   GFR calc Af Amer >60 >60 mL/min    Comment: Performed at [facility] [address] [place], [place] [address]   Dg Chest Port 1 View  Result Date: 05/30/2019 CLINICAL DATA:  Respiratory distress. EXAM: PORTABLE CHEST 1 VIEW COMPARISON:  06/09/2013 FINDINGS: Lungs are hyperexpanded. Stable asymmetric elevation right hemidiaphragm. The lungs are clear without focal pneumonia, edema, pneumothorax or pleural effusion.  Interstitial markings are diffusely coarsened with chronic features. Cardiopericardial silhouette is at upper limits of normal for size. The visualized bony structures of the thorax are intact. Telemetry leads overlie the chest. IMPRESSION: Emphysema without acute cardiopulmonary findings. Electronically Signed   By: Misty Stanley M.D.   On: 05/30/2019 19:09    Pending Labs Unresulted Labs (From admission, onward)    Start     Ordered   06/06/19 0500  Creatinine, serum  (enoxaparin (LOVENOX)    CrCl >/= 30 ml/min)  Weekly,   R  Comments: while on enoxaparin therapy    05/30/19 2146   05/30/19 2147  HIV antibody (Routine Testing)  Once,   STAT     05/30/19 2146   05/30/19 1806  Urine culture  ONCE - STAT,   STAT     05/30/19 1806   05/30/19 1806  Culture, blood (routine x 2)  BLOOD CULTURE X 2,   STAT     05/30/19 1806          Vitals/Pain Today's Vitals   05/31/19 0600 05/31/19 0700 05/31/19 0702 05/31/19 0801  BP: 102/67 116/78  122/77  Pulse: 86 88  82  Resp: (!) 21 12  16   SpO2: 98% 98%  98%  Weight:      Height:      PainSc:   0-No pain     Isolation Precautions No active isolations  Medications Medications  propofol (DIPRIVAN) 1000 MG/100ML infusion (  Not Given 05/31/19 0550)  atorvastatin (LIPITOR) tablet 10 mg (10 mg Oral Not Given 05/31/19 0002)  magnesium oxide (MAG-OX) tablet 200 mg (200 mg Oral Not Given 05/31/19 0002)  fluticasone furoate-vilanterol (BREO ELLIPTA) 200-25 MCG/INH 1 puff (1 puff Inhalation Not Given 05/30/19 2320)  loratadine (CLARITIN) tablet 10 mg (10 mg Oral Given 05/31/19 0003)  montelukast (SINGULAIR) tablet 10 mg (10 mg Oral Given 05/31/19 0107)  enoxaparin (LOVENOX) injection 30 mg (30 mg Subcutaneous Given 05/30/19 2301)  acetaminophen (TYLENOL) tablet 650 mg (has no administration in time range)    Or  acetaminophen (TYLENOL) suppository 650 mg (has no administration in time range)  albuterol (PROVENTIL) (2.5 MG/3ML) 0.083% nebulizer  solution 2.5 mg (has no administration in time range)  prednisoLONE tablet 40 mg (has no administration in time range)  ipratropium-albuterol (DUONEB) 0.5-2.5 (3) MG/3ML nebulizer solution 3 mL (has no administration in time range)  methylPREDNISolone sodium succinate (SOLU-MEDROL) 125 mg/2 mL injection (125 mg  Given 05/30/19 1805)  magnesium sulfate 2 GM/50ML IVPB (  Stopped 05/30/19 1810)  albuterol (PROVENTIL) (2.5 MG/3ML) 0.083% nebulizer solution (2.5 mg  Given 05/30/19 1817)  0.9 %  sodium chloride infusion ( Intravenous Stopped 05/31/19 0030)  sodium chloride 0.9 % bolus 500 mL (0 mLs Intravenous Stopped 05/30/19 2017)  ipratropium-albuterol (DUONEB) 0.5-2.5 (3) MG/3ML nebulizer solution 3 mL (3 mLs Nebulization Given 05/30/19 1817)    Mobility walks Low fall risk   Focused Assessments Pulmonary Assessment Handoff:  Lung sounds: Bilateral Breath Sounds: Diminished, Clear L Breath Sounds: Diminished, Expiratory wheezes, Inspiratory wheezes, Rhonchi R Breath Sounds: Diminished, Expiratory wheezes, Inspiratory wheezes, Rhonchi O2 Device: Nasal Cannula O2 Flow Rate (L/min): 3 L/min      R Recommendations: See Admitting Provider Note  Report given to:   Additional Notes:

## 2019-05-31 NOTE — Discharge Summary (Signed)
[facility] Discharge Summary  Patient name: Stacey Brock record number: [id number] Date of birth: 06-08-1961 Age: 58 y.o. Gender: female Date of Admission: 05/30/2019  Date of Discharge: 06/01/2019 Admitting Physician: Gifford Shave, MD  Primary Care Provider: Glendale Chard, MD Consultants: Cardiology   Indication for Hospitalization: asthma exacerbation   Discharge Diagnoses/Problem List:  Principal Problem:   Non-ST elevation (NSTEMI) myocardial infarction [facility]) Active Problems:   Severe persistent asthma   Disposition: discharge to home   Discharge Condition: stable   Discharge Exam:  General: Alert and cooperative and appears to be in no acute distress HEENT: Neck non-tender without lymphadenopathy, masses or thyromegaly Cardio: Normal S1 and S2, no S3 or S4. Rhythm is regular. No murmurs or rubs.  Patient is  tachycardic Pulm: No crackles, wheezing, diminished breath sounds bilaterally.  Normal respiratory effort, on room air  Abdomen: Bowel sounds normal. Abdomen soft and non-tender.  Extremities: No peripheral edema. Warm/ well perfused.   Brief Hospital Course:  Stacey Brock presented to the ED via EMS after becoming acutely SOB with altered mental status. Patient reports using albuterol inhaler daily and intermittently using Symbicort inhaler as well as past medical history significant for severe asthma.   Severe Persistent, Asthma Exacerbation Patient reports not using supplemental oxygen at home but during this admission was on 2 Liters nasal canula. Patient was found to have elevated D-dimer of 1.11 and was then evaluated for pulmonary embolism with CT angiogram. Imaging was negative for pulmonary embolism. CXR had no acute cardiopulmonary process and ruled out concern for pneumonia. Patient also tested negative for COVID-19. Blood and urine cultures had no growth at the time of discharge and beyond. Patient was treated with  Belinda Fisher inhaler, Atrovent, DuoNebulizer treatments, Claritin and Prednisone. Patient was prescribed 5 day course of PO Prednisone at the time of discharge. After which her respiratory status continued to improve and she was successfully weaned to room air, saturating 96%.   ACS Rule Out Upon presentation to the ED, patient was found to have elevated troponin level that peaked at 330. Cardiology was subsequently consulted and performed a heart catherization that showed no CAD or atherosclerosis. Patient's elevated troponin levels were thought to be due to demand ischemia.   Issues for Follow Up:  1. Patient reports ibuprofen may have caused symptoms, please review this medication and determine if this is a true allergy. 2. Ensure she is taking Symbicort daily and albuterol as needed, patient was not taking correctly.  3. Anxiety treatment with SSRI 4. Statin discontinued due to low risk ASCVD risk 5. May require ECHO as outpatient for further evaluation, cath normal while admitted.  6. Elevated hemoglobin A1c of 5.9, Pre Diabetes range, please monitor hemoglobin A1c in 3 months   Significant Procedures:   Heart Catherization:  Coronary angiography via right radial using real-time vascular ultrasound for arterial access.  Widely patent coronary arteries without evidence of coronary artery dissection, significant atherosclerosis, or other ischemia producing anatomy.  Normal LV function. No wall motion abnormality. Normal LVEDP.   Significant Labs and Imaging:  Recent Labs  Lab 05/30/19 1809  05/30/19 1816 05/30/19 1836 05/30/19 2330  WBC 8.6  --   --   --  8.1  HGB 11.9*   < > 12.9 11.6* 10.3*  HCT 38.7   < > 38.0 34.0* 32.4*  PLT 500*  --   --   --  377   < > = values in this interval  not displayed.   Recent Labs  Lab 05/30/19 1809 05/30/19 1815 05/30/19 1816 05/30/19 1836 05/30/19 2330 05/31/19 0930 06/01/19 0346  NA 136 138 139 138  --   --  141  K 3.6 3.8 3.7 3.7   --   --  4.4  CL 99 100  --   --   --   --  109  CO2 25  --   --   --   --   --  27  GLUCOSE 191* 191*  --   --   --   --  120*  BUN 7 9  --   --   --   --  8  CREATININE 0.64 0.60  --   --  0.61  --  0.57  CALCIUM 8.7*  --   --   --   --   --  8.6*  MG  --   --   --   --   --  2.2  --   ALKPHOS 61  --   --   --   --   --   --   AST 27  --   --   --   --   --   --   ALT 19  --   --   --   --   --   --   ALBUMIN 4.3  --   --   --   --   --   --    Results/Tests Pending at Time of Discharge: none  Discharge Medications:  Allergies as of 06/01/2019      Reactions   Aspirin Other (See Comments)   Pt cannot specify reaction type and unsure if true allergy, but she states " I almost died after I had Aspirin, my heart stopped on operating table".    Ibuprofen Shortness Of Breath   Unsure if this is a true allergy. Patient presents with sweating, tachy, dizziness, flushing      Medication List    STOP taking these medications   atorvastatin 10 MG tablet Commonly known as: Lipitor   Magnesium 250 MG Tabs     TAKE these medications   albuterol 108 (90 Base) MCG/ACT inhaler Commonly known as: ProAir HFA Inhale 2 puffs into the lungs every 4 (four) hours as needed for wheezing. What changed: Another medication with the same name was added. Make sure you understand how and when to take each.   albuterol (2.5 MG/3ML) 0.083% nebulizer solution Commonly known as: PROVENTIL Take 3 mLs (2.5 mg total) by nebulization every 6 (six) hours as needed for wheezing or shortness of breath. What changed: You were already taking a medication with the same name, and this prescription was added. Make sure you understand how and when to take each.   budesonide-formoterol 160-4.5 MCG/ACT inhaler Commonly known as: Symbicort Inhale 2 puffs into the lungs 2 (two) times daily. What changed:   when to take this  reasons to take this   loratadine 10 MG tablet Commonly known as: CLARITIN Take 10 mg  by mouth at bedtime.   montelukast 10 MG tablet Commonly known as: SINGULAIR TAKE 1 TABLET BY MOUTH EVERY DAY IN THE EVENING What changed:   how much to take  how to take this  when to take this   predniSONE 20 MG tablet Commonly known as: DELTASONE Take 2 tablets (40 mg total) by mouth daily with breakfast for 3 days.   rosuvastatin 10 MG  tablet Commonly known as: CRESTOR Take 1 tablet (10 mg total) by mouth daily at 6 PM.   Vitamin D (Ergocalciferol) 1.25 MG (50000 UT) Caps capsule Commonly known as: DRISDOL Take 1 capsule (50,000 Units total) by mouth 2 (two) times a week.       Discharge Instructions: Please refer to Patient Instructions section of EMR for full details.  Patient was counseled important signs and symptoms that should prompt return to medical care, changes in medications, dietary instructions, activity restrictions, and follow up appointments.   Follow-Up Appointments: Follow-up Information    Glendale Chard, MD. Schedule an appointment as soon as possible for a visit in 1 week(s).   Specialty: Internal Medicine Contact information: [address] [place] [address] [phone]           Stark Klein, MD 06/01/2019, 2:35 PM PGY-1, [place]

## 2019-05-31 NOTE — ED Notes (Signed)
Breakfast ordered--Stacey Brock  
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
## 2019-05-31 NOTE — ED Notes (Signed)
MD paged to downgrade pt's bed.  Pt stable, eating breakfast.
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
## 2019-05-31 NOTE — H&P (View-Only) (Signed)
, [facility] Consultation:   Patient ID: [name] MRN: [id number]; DOB: 07-Mar-1961  Admit date: 05/30/2019 Date of Consult: 05/31/2019  Primary Care Provider: Glendale Chard, MD Primary Cardiologist: New to [facility] Primary Electrophysiologist:  None    Patient Profile:   Stacey Brock is a 58 y.o. female with a hx of asthma who is being seen today for the evaluation of elevated troponin at the request of Dr. Gwendlyn Deutscher.  History of Present Illness:   Stacey Brock has a history of severe persistent asthma and right middle lobe syndrome.  She was brought in with shortness of breath and change in level of consciousness.  Notes indicate that the patient's symptoms began after she took ibuprofen for back pain.  Soon after taking the medication she started sweating and felt dizzy.  Later she developed shortness of breath.  She used her albuterol without any improvement.  She has a history of multiple asthma exacerbations but denied previous difficulty with ibuprofen.  Notes indicate that PFT report indicated COPD/emphysema.   High-sensitivity troponin was checked and initially was 65 but rose to 330.  She was hypoxic and tachycardic in the ED.  D-dimer was elevated but CTA of the chest showed no pulmonary embolism.  Her symptoms improved with IV Solu-Medrol oxide and duo nebs.  She was also treated with supplemental oxygen.  Today Stacey Brock tells me that she has no prior cardiac history or any cardiac testing.  She denies smoking or alcohol use.  She denies a prior history of high cholesterol or ever being medicated for it.  She does not work.  She does walk on most days, often to the store.  She denies any chest discomfort or shortness of breath with activity.  She does admit to some chest pain across the top of her chest that occurred when she was using her arms in the latter part of last week.  Yesterday she developed upper back pain/tension/tightness.  She had a friend to rub her  back with lotion.  Another friend gave her an ibuprofen.  It was after the ibuprofen that she developed significant respiratory difficulty.  However, she does now admit that she had some mild shortness of breath with the back pain prior to the ibuprofen.  She has oxygen that she can use during asthma attacks as well as a nebulizer but she does not have any albuterol to go in the nebulizer.  She tried using the oxygen and an albuterol inhaler with no improvement in her symptoms.  When her friend noted significant respiratory difficulty and the patient turning "purple and white" she called 911.  The patient does note that she was also wheezing.  She greatly improved with treatment.  Today she says that she is back at her baseline.  High-sensitivity troponin was initially 65 and rose to 330.  BNP was 89.6.  D-dimer was elevated at 1.11.  Chest CTA showed no evidence of pulmonary embolism, no acute intrathoracic process, scarring in the lingula and right upper lobe, likely postinfectious/inflammatory.  There was atherosclerotic plaque in the aorta.  No mention of atherosclerosis in the coronary arteries.  Chest x-ray showed emphysema without acute cardiopulmonary findings.  Heart Pathway Score:     Past Medical History:  Diagnosis Date   Asthma    Sinus trouble     History reviewed. No pertinent surgical history.   Home Medications:  Prior to Admission medications   Medication Sig Start Date End Date Taking? Authorizing Provider  albuterol (PROAIR HFA) 108 (90 Base) MCG/ACT inhaler Inhale 2 puffs into the lungs every 4 (four) hours as needed for wheezing. 03/11/19  Yes Minette Brine, FNP  budesonide-formoterol [facility]) 160-4.5 MCG/ACT inhaler Inhale 2 puffs into the lungs 2 (two) times daily. Patient taking differently: Inhale 2 puffs into the lungs 2 (two) times daily as needed (shortness of breath).  03/11/19  Yes Minette Brine, FNP  loratadine (CLARITIN) 10 MG tablet Take 10 mg by mouth at  bedtime.    Yes [provider]  montelukast (SINGULAIR) 10 MG tablet TAKE 1 TABLET BY MOUTH EVERY DAY IN THE EVENING Patient taking differently: Take 10 mg by mouth at bedtime.  03/03/19  Yes Minette Brine, FNP  atorvastatin (LIPITOR) 10 MG tablet Take 1 tablet (10 mg total) by mouth daily. Patient not taking: Reported on 05/30/2019 03/11/19 03/10/20  Minette Brine, FNP  Magnesium 250 MG TABS Take 1 tablet (250 mg total) by mouth every evening. Patient not taking: Reported on 05/30/2019 03/11/19   Minette Brine, FNP  Vitamin D, Ergocalciferol, (DRISDOL) 1.25 MG (50000 UT) CAPS capsule Take 1 capsule (50,000 Units total) by mouth 2 (two) times a week. Patient not taking: Reported on 05/30/2019 03/11/19   Minette Brine, FNP    Inpatient Medications: Scheduled Meds:  enoxaparin (LOVENOX) injection  40 mg Subcutaneous Q24H   fluticasone furoate-vilanterol  1 puff Inhalation Daily   [START ON 06/01/2019] ipratropium-albuterol  3 mL Nebulization BID   loratadine  10 mg Oral Daily   montelukast  10 mg Oral QHS   predniSONE  40 mg Oral Q breakfast   Continuous Infusions:  PRN Meds: acetaminophen **OR** acetaminophen, albuterol  Allergies:    Allergies  Allergen Reactions   Ibuprofen Shortness Of Breath    Sweaty, tachy, dizziness, flushing     Social History:   Social History   Socioeconomic History   Marital status: Single    Spouse name: Not on file   Number of children: 0   Years of education: Not on file   Highest education level: Not on file  Occupational History   Occupation: disabled  Social Designer, fashion/clothing strain: Not hard at all   Food insecurity    Worry: Never true    Inability: Never true   Transportation needs    Medical: No    Non-medical: No  Tobacco Use   Smoking status: Never Smoker   Smokeless tobacco: Never Used  Substance and Sexual Activity   Alcohol use: No   Drug use: No   Sexual activity: Not Currently  Lifestyle     Physical activity    Days per week: 7 days    Minutes per session: 20 min   Stress: Very much  Relationships   Social connections    Talks on phone: Not on file    Gets together: Not on file    Attends religious service: Not on file    Active member of club or organization: Not on file    Attends meetings of clubs or organizations: Not on file    Relationship status: Not on file   Intimate partner violence    Fear of current or ex partner: No    Emotionally abused: No    Physically abused: No    Forced sexual activity: No  Other Topics Concern   Not on file  Social History Narrative   Not on file    Family History:    Family History  Problem Relation Age  of Onset   Asthma Sister    Cancer Father        colon     ROS:  Please see the history of present illness.   All other ROS reviewed and negative.     Physical Exam/Data:   Vitals:   05/31/19 0915 05/31/19 1023 05/31/19 1318 05/31/19 1334  BP: 115/85 119/80 (!) 140/118 130/70  Pulse: 81 98 91   Resp: (!) 21 20 20    Temp:  98.3 F (36.8 C) 98.4 F (36.9 C)   TempSrc:  Oral Oral   SpO2: 100% 100% 100%   Weight:  41.9 kg    Height:  5' (1.524 m)      Intake/Output Summary (Last 24 hours) at 05/31/2019 1542 Last data filed at 05/30/2019 2017 Gross per 24 hour  Intake 550 ml  Output --  Net 550 ml   Last 3 Weights 05/31/2019 05/30/2019 03/11/2019  Weight (lbs) 92 lb 6 oz 89 lb 8.1 oz 89 lb 9.6 oz  Weight (kg) 41.9 kg 40.6 kg 40.642 kg  Some encounter information is confidential and restricted. Go to Review Flowsheets activity to see all data.     Body mass index is 18.04 kg/m.  General:  Thin female, in no acute distress HEENT: normal Lymph: no adenopathy Neck: no JVD Endocrine:  No thryomegaly Vascular: No carotid bruits; FA pulses 2+ bilaterally without bruits  Cardiac:  normal S1, S2; RRR; no murmur  Lungs:  clear to auscultation bilaterally, no wheezing, rhonchi or rales  Abd: soft,  nontender, no hepatomegaly  Ext: no edema Musculoskeletal:  No deformities, ace wrap on left forearm. BUE and BLE strength normal and equal Skin: warm and dry  Neuro:  CNs 2-12 intact, no focal abnormalities noted Psych:  Normal affect   EKG:  The EKG was personally reviewed and demonstrates:  Normal sinus rhythm, 87 bpm, Minimal voltage criteria for LVH, may be normal variant Telemetry:  Telemetry was personally reviewed and demonstrates:  Sinus rhythm in the 80's-90's.   Relevant CV Studies:  Echocardiogram pending  Laboratory Data:  High Sensitivity Troponin:   Recent Labs  Lab 05/30/19 1809 05/30/19 2124 05/31/19 1119 05/31/19 1450  TROPONINIHS 65* 89* 330* 286*     Cardiac EnzymesNo results for input(s): TROPONINI in the last 168 hours. No results for input(s): TROPIPOC in the last 168 hours.  Chemistry Recent Labs  Lab 05/30/19 1809 05/30/19 1815 05/30/19 1816 05/30/19 1836 05/30/19 2330  NA 136 138 139 138  --   K 3.6 3.8 3.7 3.7  --   CL 99 100  --   --   --   CO2 25  --   --   --   --   GLUCOSE 191* 191*  --   --   --   BUN 7 9  --   --   --   CREATININE 0.64 0.60  --   --  0.61  CALCIUM 8.7*  --   --   --   --   GFRNONAA >60  --   --   --  >60  GFRAA >60  --   --   --  >60  ANIONGAP 12  --   --   --   --     Recent Labs  Lab 05/30/19 1809  PROT 7.3  ALBUMIN 4.3  AST 27  ALT 19  ALKPHOS 61  BILITOT 0.9   Hematology Recent Labs  Lab 05/30/19 1809  05/30/19 1816  05/30/19 1836 05/30/19 2330  WBC 8.6  --   --   --  8.1  RBC 4.15  --   --   --  3.54*  HGB 11.9*   < > 12.9 11.6* 10.3*  HCT 38.7   < > 38.0 34.0* 32.4*  MCV 93.3  --   --   --  91.5  MCH 28.7  --   --   --  29.1  MCHC 30.7  --   --   --  31.8  RDW 13.0  --   --   --  13.0  PLT 500*  --   --   --  377   < > = values in this interval not displayed.   BNP Recent Labs  Lab 05/30/19 1809  BNP 89.6    DDimer  Recent Labs  Lab 05/31/19 1119  DDIMER 1.11*      Radiology/Studies:  Ct Angio Chest Pe W Or Wo Contrast  Result Date: 05/31/2019 CLINICAL DATA:  PE suspected, positive D-dimer, chest pain last week gas or focal wall with dog 8 EXAM: CT ANGIOGRAPHY CHEST WITH CONTRAST TECHNIQUE: Multidetector CT imaging of the chest was performed using the standard protocol during bolus administration of intravenous contrast. Multiplanar CT image reconstructions and MIPs were obtained to evaluate the vascular anatomy. CONTRAST:  29mL OMNIPAQUE IOHEXOL 350 MG/ML SOLN COMPARISON:  Chest radiograph May 30, 2019 FINDINGS: Cardiovascular: Satisfactory opacification of the pulmonary arteries to the segmental level. No evidence of pulmonary embolism or pulmonary artery enlargement. Atherosclerotic plaque within the normal caliber aorta. Left vertebral artery arises directly from the aortic arch. Trace pericardial effusion. Heart is otherwise unremarkable. Mediastinum/Nodes: No enlarged hilar, mediastinal or axillary lymph nodes. Thyroid gland, trachea, and esophagus demonstrate no significant findings. Lungs/Pleura: Bandlike areas of scarring/architectural distortion with associated bronchiectasis in lingula and anterior segment right upper lobe. No consolidation, features of edema, pneumothorax, or effusion. No suspicious pulmonary nodules or masses. Upper Abdomen: No acute abnormalities present in the visualized portions of the upper abdomen. Musculoskeletal: Marked levocurvature of the thoracic spine and dextrocurvature of the included lumbar spine. Resultant chest wall deformity. Multilevel degenerative changes are present in the imaged portions of the spine. No other acute or significant osseous abnormalities. Review of the MIP images confirms the above findings. IMPRESSION: No evidence of pulmonary embolism. No acute intrathoracic process. Scarring in the lingula and right upper lobe, likely post infectious/postinflammatory Aortic Atherosclerosis (ICD10-I70.0).  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 Electronically Signed   By: Lovena Le M.D.   On: 05/31/2019 14:34   Dg Chest Port 1 View  Result Date: 05/30/2019 CLINICAL DATA:  Respiratory distress. EXAM: PORTABLE CHEST 1 VIEW COMPARISON:  06/09/2013 FINDINGS: Lungs are hyperexpanded. Stable asymmetric elevation right hemidiaphragm. The lungs are clear without focal pneumonia, edema, pneumothorax or pleural effusion. Interstitial markings are diffusely coarsened with chronic features. Cardiopericardial silhouette is at upper limits of normal for size. The visualized bony structures of the thorax are intact. Telemetry leads overlie the chest. IMPRESSION: Emphysema without acute cardiopulmonary findings. Electronically Signed   By: Misty Stanley M.D.   On: 05/30/2019 19:09    Assessment and Plan:   Elevated troponin- NSTEMI vs demand ischemia -In the setting of asthma exacerbation -High-sensitivity troponin was initially 65 and rose to 330 then 286.  BNP was 89.6.   -D-dimer was elevated at 1.11.  Chest CTA showed no evidence of pulmonary embolism, no acute intrathoracic process, scarring in the lingula  and right upper lobe, likely postinfectious/inflammatory.  There was atherosclerotic plaque in the aorta.  No mention of atherosclerosis in the coronary arteries.   -Chest x-ray showed emphysema without acute cardiopulmonary findings. -COVID-19 testing was negative. -Echocardiogram is pending -CVD risk factors include only hyperlipidemia.  Her A1c is 5.9 (prediabetes).  She denies ever smoking.  She is not aware of any family history of cardiac disease. -The patient initially presented with respiratory difficulty, wheezing and hypoxia, felt to be asthma exacerbation.   -Elevated troponin could be related to demand ischemia in setting of asthma exacerbation, however, her symptoms initially started with pain in her upper back with some mild shortness of breath.  This could  have been angina which then exacerbated her asthma. -Will plan for cardiac cath tomorrow to definitively evaluate her coronary arteries.   Hyperlipidemia -Total cholesterol was 300 in December 2019 with LDL of 206.  Lipid panel in 03/2019 showed total cholesterol 234 with LDL of 152, HDL 68. -Pt denies knowledge of high cholesterol and says she has not been on treatment. -Will start crestor 10 mg.   Asthma exacerbation -Patient with known history of severe asthma. -Presented with wheezing, dyspnea and hypoxia.  Significantly improved with IV steroids, bronchodilator and supplemental oxygen. Pt says that she is back to her baseline.      For questions or updates, please contact [place] Please consult [email or web address] for contact info under     Signed, Daune Perch, NP  05/31/2019 3:42 PM  Personally seen and examined. Agree with above.   58 yo female with asthma, chest pain/back pain, with hs trop increase to 300, tachycardia on admit, with no ST segment changes.   GEN: Thin, in no acute distress  HEENT: normal  Neck: no JVD, carotid bruits, or masses Cardiac: RRR; no murmurs, rubs, or gallops,no edema  Respiratory:  clear to auscultation bilaterally, normal work of breathing GI: soft, nontender, nondistended, + BS MS: no deformity or atrophy  Skin: warm and dry, no rash Neuro:  Alert and Oriented x 3, Strength and sensation are intact Psych: euthymic mood, full affect  Tele unremarkable.   ECHO pending  A/P  NSTEMI vs demand ischemia/ hyperlipidemia  - possible demand ischemia but strange history, back pain, significant hs trop increase. I would like to have her set up for heart cath to ensure that she does not have any CAD. Her LDL at one point was 206.  If cath is normal, OK to call this demand ischemia.   Ashtma  - Saw Dr. Melvyn Novas in 2015. Notes reviewed.   - Severe PFT's in the past.   - Tx per main team. She does feel better.   Candee Furbish, MD

## 2019-06-01 ENCOUNTER — Encounter (HOSPITAL_COMMUNITY): Payer: Self-pay | Admitting: Interventional Cardiology

## 2019-06-01 ENCOUNTER — Encounter (HOSPITAL_COMMUNITY)
Admission: EM | Disposition: A | Discharge status: Home / Self Care | Payer: Self-pay | Source: Home / Self Care | Attending: Family Medicine

## 2019-06-01 ENCOUNTER — Other Ambulatory Visit: Payer: Self-pay | Admitting: Family Medicine

## 2019-06-01 DIAGNOSIS — R7989 Other specified abnormal findings of blood chemistry: Secondary | ICD-10-CM

## 2019-06-01 DIAGNOSIS — E785 Hyperlipidemia, unspecified: Secondary | ICD-10-CM

## 2019-06-01 DIAGNOSIS — R778 Other specified abnormalities of plasma proteins: Secondary | ICD-10-CM

## 2019-06-01 DIAGNOSIS — I214 Non-ST elevation (NSTEMI) myocardial infarction: Secondary | ICD-10-CM

## 2019-06-01 HISTORY — PX: LEFT HEART CATH AND CORONARY ANGIOGRAPHY: CATH118249

## 2019-06-01 HISTORY — DX: Non-ST elevation (NSTEMI) myocardial infarction: I21.4

## 2019-06-01 LAB — BASIC METABOLIC PANEL
Anion gap: 5 (ref 5–15)
BUN: 8 mg/dL (ref 6–20)
CO2: 27 mmol/L (ref 22–32)
Calcium: 8.6 mg/dL — ABNORMAL LOW (ref 8.9–10.3)
Chloride: 109 mmol/L (ref 98–111)
Creatinine, Ser: 0.57 mg/dL (ref 0.44–1.00)
GFR calc Af Amer: >60 mL/min (ref >60)
GFR calc non Af Amer: >60 mL/min (ref >60)
Glucose, Bld: 120 mg/dL — ABNORMAL HIGH (ref 70–99)
Potassium: 4.4 mmol/L (ref 3.5–5.1)
Sodium: 141 mmol/L (ref 135–145)

## 2019-06-01 LAB — URINE CULTURE: Culture: NO GROWTH

## 2019-06-01 SURGERY — LEFT HEART CATH AND CORONARY ANGIOGRAPHY
Anesthesia: LOCAL

## 2019-06-01 MED ORDER — FENTANYL CITRATE (PF) 100 MCG/2ML IJ SOLN
INTRAMUSCULAR | Status: DC | PRN
  Administered 2019-06-01: 12.5 ug via INTRAVENOUS

## 2019-06-01 MED ORDER — SODIUM CHLORIDE 0.9 % WEIGHT BASED INFUSION: 1.0000 mL/kg/h | INTRAVENOUS | Status: DC

## 2019-06-01 MED ORDER — ALBUTEROL SULFATE (2.5 MG/3ML) 0.083% IN NEBU: 2.5000 mg | INHALATION_SOLUTION | Freq: Four times a day (QID) | RESPIRATORY_TRACT | 12 refills | Status: DC | PRN

## 2019-06-01 MED ORDER — IOHEXOL 350 MG/ML SOLN
INTRAVENOUS | Status: DC | PRN
  Administered 2019-06-01: 55 mL via INTRA_ARTERIAL

## 2019-06-01 MED ORDER — SODIUM CHLORIDE 0.9 % WEIGHT BASED INFUSION
3.0000 mL/kg/h | INTRAVENOUS | Status: AC
  Administered 2019-06-01: 3 mL/kg/h via INTRAVENOUS

## 2019-06-01 MED ORDER — SODIUM CHLORIDE 0.9 % WEIGHT BASED INFUSION: 3.0000 mL/kg/h | INTRAVENOUS | Status: DC

## 2019-06-01 MED ORDER — ASPIRIN 81 MG PO CHEW: 81.0000 mg | CHEWABLE_TABLET | ORAL | Status: DC

## 2019-06-01 MED ORDER — ROSUVASTATIN CALCIUM 10 MG PO TABS: 10.0000 mg | ORAL_TABLET | Freq: Every day | ORAL | 0 refills | Status: DC

## 2019-06-01 MED ORDER — VERAPAMIL HCL 2.5 MG/ML IV SOLN
INTRAVENOUS | Status: DC | PRN
  Administered 2019-06-01: 10 mL via INTRA_ARTERIAL

## 2019-06-01 MED ORDER — SODIUM CHLORIDE 0.9 % IV SOLN: INTRAVENOUS | Status: AC

## 2019-06-01 MED ORDER — MIDAZOLAM HCL 2 MG/2ML IJ SOLN
INTRAMUSCULAR | Status: DC | PRN
  Administered 2019-06-01 (×2): 0.5 mg via INTRAVENOUS

## 2019-06-01 MED ORDER — BUDESONIDE-FORMOTEROL FUMARATE 160-4.5 MCG/ACT IN AERO: 2.0000 | INHALATION_SPRAY | Freq: Two times a day (BID) | RESPIRATORY_TRACT | 6 refills | Status: DC

## 2019-06-01 MED ORDER — FENTANYL CITRATE (PF) 100 MCG/2ML IJ SOLN
INTRAMUSCULAR | Status: AC
  Filled 2019-06-01: qty 2

## 2019-06-01 MED ORDER — HEPARIN SODIUM (PORCINE) 1000 UNIT/ML IJ SOLN
INTRAMUSCULAR | Status: AC
  Filled 2019-06-01: qty 1

## 2019-06-01 MED ORDER — PREDNISONE 20 MG PO TABS: 40.0000 mg | ORAL_TABLET | Freq: Every day | ORAL | 0 refills | Status: AC

## 2019-06-01 MED ORDER — ONDANSETRON HCL 4 MG/2ML IJ SOLN: 4.0000 mg | Freq: Four times a day (QID) | INTRAMUSCULAR | Status: DC | PRN

## 2019-06-01 MED ORDER — HEPARIN (PORCINE) IN NACL 1000-0.9 UT/500ML-% IV SOLN
INTRAVENOUS | Status: DC | PRN
  Administered 2019-06-01 (×2): 500 mL

## 2019-06-01 MED ORDER — LIDOCAINE HCL (PF) 1 % IJ SOLN
INTRAMUSCULAR | Status: AC
  Filled 2019-06-01: qty 30

## 2019-06-01 MED ORDER — LIDOCAINE HCL (PF) 1 % IJ SOLN
INTRAMUSCULAR | Status: DC | PRN
  Administered 2019-06-01: 2 mL via INTRADERMAL

## 2019-06-01 MED ORDER — HEPARIN SODIUM (PORCINE) 1000 UNIT/ML IJ SOLN
INTRAMUSCULAR | Status: DC | PRN
  Administered 2019-06-01: 2500 [IU] via INTRAVENOUS

## 2019-06-01 MED ORDER — MIDAZOLAM HCL 2 MG/2ML IJ SOLN
INTRAMUSCULAR | Status: AC
  Filled 2019-06-01: qty 2

## 2019-06-01 MED ORDER — HEPARIN (PORCINE) IN NACL 1000-0.9 UT/500ML-% IV SOLN
INTRAVENOUS | Status: AC
  Filled 2019-06-01: qty 1000

## 2019-06-01 MED ORDER — VERAPAMIL HCL 2.5 MG/ML IV SOLN
INTRAVENOUS | Status: AC
  Filled 2019-06-01: qty 2

## 2019-06-01 SURGICAL SUPPLY — 9 items
CATH 5FR JL3.5 JR4 ANG PIG MP (CATHETERS) ×1 IMPLANT
GLIDESHEATH SLEND A-KIT 6F 22G (SHEATH) ×1 IMPLANT
GUIDEWIRE INQWIRE 1.5J.035X260 (WIRE) IMPLANT
INQWIRE 1.5J .035X260CM (WIRE) ×2
KIT HEART LEFT (KITS) ×2 IMPLANT
PACK CARDIAC CATHETERIZATION (CUSTOM PROCEDURE TRAY) ×2 IMPLANT
TRANSDUCER W/STOPCOCK (MISCELLANEOUS) ×2 IMPLANT
TUBING CIL FLEX 10 FLL-RA (TUBING) ×2 IMPLANT
WIRE HI TORQ VERSACORE-J 145CM (WIRE) ×1 IMPLANT

## 2019-06-01 NOTE — Progress Notes (Signed)
Progress Note  Patient Name: Stacey Brock Date of Encounter: 06/01/2019  Primary Cardiologist: No primary care provider on file. new  Subjective   Feeling well post catheterization  Inpatient Medications    Scheduled Meds: . [MAR Hold] enoxaparin (LOVENOX) injection  30 mg Subcutaneous Q24H  . [MAR Hold] fluticasone furoate-vilanterol  1 puff Inhalation Daily  . [MAR Hold] ipratropium-albuterol  3 mL Nebulization BID  . [MAR Hold] loratadine  10 mg Oral Daily  . [MAR Hold] montelukast  10 mg Oral QHS  . [MAR Hold] predniSONE  40 mg Oral Q breakfast  . [MAR Hold] rosuvastatin  10 mg Oral q1800  . [MAR Hold] sodium chloride flush  3 mL Intravenous Q12H   Continuous Infusions: . sodium chloride 125 mL/hr at 06/01/19 0834  . sodium chloride Stopped (06/01/19 0835)   PRN Meds: [MAR Hold] acetaminophen **OR** [MAR Hold] acetaminophen, [MAR Hold] albuterol, ondansetron (ZOFRAN) IV   Vital Signs    Vitals:   06/01/19 0825 06/01/19 0830 06/01/19 0845 06/01/19 0900  BP:  122/82 (!) 118/98 116/78  Pulse: (!) 0 81 85 79  Resp: (!) 29 10 15 17   Temp:      TempSrc:      SpO2: (!) 0% 93% 96% 96%  Weight:      Height:        Intake/Output Summary (Last 24 hours) at 06/01/2019 0945 Last data filed at 05/31/2019 1616 Gross per 24 hour  Intake 859.04 ml  Output -  Net 859.04 ml   Last 3 Weights 06/01/2019 05/31/2019 05/30/2019  Weight (lbs) 95 lb 7.4 oz 92 lb 6 oz 89 lb 8.1 oz  Weight (kg) 43.3 kg 41.9 kg 40.6 kg  Some encounter information is confidential and restricted. Go to Review Flowsheets activity to see all data.      Telemetry    Normal rhythm- Personally Reviewed  ECG    No ischemic changes- Personally Reviewed  Physical Exam   GEN: No acute distress.   Neck: No JVD Cardiac: RRR, no murmurs, rubs, or gallops.  Respiratory: Clear to auscultation bilaterally. GI: Soft, nontender, non-distended  MS: No edema; No deformity. Neuro:  Nonfocal  Psych:  Normal affect   Labs    High Sensitivity Troponin:   Recent Labs  Lab 05/30/19 1809 05/30/19 2124 05/31/19 1119 05/31/19 1450  TROPONINIHS 65* 89* 330* 286*      Chemistry Recent Labs  Lab 05/30/19 1809 05/30/19 1815 05/30/19 1816 05/30/19 1836 05/30/19 2330 06/01/19 0346  NA 136 138 139 138  --  141  K 3.6 3.8 3.7 3.7  --  4.4  CL 99 100  --   --   --  109  CO2 25  --   --   --   --  27  GLUCOSE 191* 191*  --   --   --  120*  BUN 7 9  --   --   --  8  CREATININE 0.64 0.60  --   --  0.61 0.57  CALCIUM 8.7*  --   --   --   --  8.6*  PROT 7.3  --   --   --   --   --   ALBUMIN 4.3  --   --   --   --   --   AST 27  --   --   --   --   --   ALT 19  --   --   --   --   --  ALKPHOS 61  --   --   --   --   --   BILITOT 0.9  --   --   --   --   --   GFRNONAA >60  --   --   --  >60 >60  GFRAA >60  --   --   --  >60 >60  ANIONGAP 12  --   --   --   --  5     Hematology Recent Labs  Lab 05/30/19 1809  05/30/19 1816 05/30/19 1836 05/30/19 2330  WBC 8.6  --   --   --  8.1  RBC 4.15  --   --   --  3.54*  HGB 11.9*   < > 12.9 11.6* 10.3*  HCT 38.7   < > 38.0 34.0* 32.4*  MCV 93.3  --   --   --  91.5  MCH 28.7  --   --   --  29.1  MCHC 30.7  --   --   --  31.8  RDW 13.0  --   --   --  13.0  PLT 500*  --   --   --  377   < > = values in this interval not displayed.    BNP Recent Labs  Lab 05/30/19 1809  BNP 89.6     DDimer  Recent Labs  Lab 05/31/19 1119  DDIMER 1.11*     Radiology    Ct Angio Chest Pe W Or Wo Contrast  Result Date: 05/31/2019 CLINICAL DATA:  PE suspected, positive D-dimer, chest pain last week gas or focal wall with dog 8 EXAM: CT ANGIOGRAPHY CHEST WITH CONTRAST TECHNIQUE: Multidetector CT imaging of the chest was performed using the standard protocol during bolus administration of intravenous contrast. Multiplanar CT image reconstructions and MIPs were obtained to evaluate the vascular anatomy. CONTRAST:  20mL OMNIPAQUE IOHEXOL 350  MG/ML SOLN COMPARISON:  Chest radiograph May 30, 2019 FINDINGS: Cardiovascular: Satisfactory opacification of the pulmonary arteries to the segmental level. No evidence of pulmonary embolism or pulmonary artery enlargement. Atherosclerotic plaque within the normal caliber aorta. Left vertebral artery arises directly from the aortic arch. Trace pericardial effusion. Heart is otherwise unremarkable. Mediastinum/Nodes: No enlarged hilar, mediastinal or axillary lymph nodes. Thyroid gland, trachea, and esophagus demonstrate no significant findings. Lungs/Pleura: Bandlike areas of scarring/architectural distortion with associated bronchiectasis in lingula and anterior segment right upper lobe. No consolidation, features of edema, pneumothorax, or effusion. No suspicious pulmonary nodules or masses. Upper Abdomen: No acute abnormalities present in the visualized portions of the upper abdomen. Musculoskeletal: Marked levocurvature of the thoracic spine and dextrocurvature of the included lumbar spine. Resultant chest wall deformity. Multilevel degenerative changes are present in the imaged portions of the spine. No other acute or significant osseous abnormalities. Review of the MIP images confirms the above findings. IMPRESSION: No evidence of pulmonary embolism. No acute intrathoracic process. Scarring in the lingula and right upper lobe, likely post infectious/postinflammatory Aortic Atherosclerosis (ICD10-I70.0). 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 Electronically Signed   By: Lovena Le M.D.   On: 05/31/2019 14:34   Dg Chest Port 1 View  Result Date: 05/30/2019 CLINICAL DATA:  Respiratory distress. EXAM: PORTABLE CHEST 1 VIEW COMPARISON:  06/09/2013 FINDINGS: Lungs are hyperexpanded. Stable asymmetric elevation right hemidiaphragm. The lungs are clear without focal pneumonia, edema, pneumothorax or pleural effusion. Interstitial markings are diffusely coarsened with chronic  features. Cardiopericardial silhouette is at upper limits  of normal for size. The visualized bony structures of the thorax are intact. Telemetry leads overlie the chest. IMPRESSION: Emphysema without acute cardiopulmonary findings. Electronically Signed   By: Misty Stanley M.D.   On: 05/30/2019 19:09    Cardiac Studies   Reassuring heart catheterization, normal.  Patient Profile     57 y.o. female with demand ischemia in the setting of asthma exacerbation  Assessment & Plan    Demand ischemia -She did not have acute coronary syndrome.  She did not have non-STEMI.  Based upon further diagnostic evaluation, she did have demand ischemia in the setting of her acute asthma attack.  Reassuring heart catheterization. -Continue to treat with statin therapy. -Mildly elevated troponin in the setting of acute asthma attack does however portend a worsened overall prognosis. - No need for cardiology follow-up.  Asthma - Highly recommend repeat visit to pulmonary clinic.  She saw Dr. Melvyn Novas in the past.  PFTs previously reviewed.  We will sign off.  Please let us know if we can be of further assistance.     For questions or updates, please contact [place] Please consult [email or web address] for contact info under        Signed, Candee Furbish, MD  06/01/2019, 9:45 AM

## 2019-06-01 NOTE — Progress Notes (Addendum)
Report given to cath lab. Eliberto Ivory, RN aware that pt did not get aspirin due to allergy.  RN in cath lab also aware that pt did not get Lovenox last night.

## 2019-06-01 NOTE — Progress Notes (Signed)
Zerina L Krzywicki to be D/C'd Home per MD order.  Discussed with the patient and all questions fully answered.  VSS, Skin clean, dry and intact without evidence of skin break down, no evidence of skin tears noted. IV catheter discontinued intact. Site without signs and symptoms of complications. Dressing and pressure applied.  An After Visit Summary was printed and given to the patient. Patient received prescription.  D/c education completed with patient/family including follow up instructions, medication list, d/c activities limitations if indicated, with other d/c instructions as indicated by MD - patient able to verbalize understanding, all questions fully answered.   Patient instructed to return to ED, call 911, or call MD for any changes in condition.   Patient escorted via [place], and D/C home via private auto.    Lorenza Evangelist [facility] 06/01/2019 6:25 PM

## 2019-06-01 NOTE — Progress Notes (Signed)
Patients ambulated down the hall 90 ft  Pt HR 116 and 96% oxygen sat on room air, no complaints of shortness of breath or difficulty breathing.

## 2019-06-01 NOTE — Interval H&P Note (Signed)
Cath Lab Visit (complete for each Cath Lab visit)  Clinical Evaluation Leading to the Procedure:   ACS: Yes.    Non-ACS:    Anginal Classification: CCS III  Anti-ischemic medical therapy: Minimal Therapy (1 class of medications)  Non-Invasive Test Results: No non-invasive testing performed  Prior CABG: No previous CABG      History and Physical Interval Note:  06/01/2019 7:37 AM  Stacey Brock  has presented today for surgery, with the diagnosis of non stemi.  The various methods of treatment have been discussed with the patient and family. After consideration of risks, benefits and other options for treatment, the patient has consented to  Procedure(s): LEFT HEART CATH AND CORONARY ANGIOGRAPHY (N/A) as a surgical intervention.  The patient's history has been reviewed, patient examined, no change in status, stable for surgery.  I have reviewed the patient's chart and labs.  Questions were answered to the patient's satisfaction.     [name]

## 2019-06-01 NOTE — Progress Notes (Signed)
Pt requested not to share her information with her sister, so her name removed from chart on pt request.

## 2019-06-01 NOTE — Progress Notes (Signed)
TR band removed, R. Radial R. Wrist dressed with guaze and tegaderm
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
## 2019-06-01 NOTE — CV Procedure (Signed)
   Coronary angiography in the setting of chest pain and elevated high-sensitivity troponin.  Coronary angiography via right radial using real-time vascular ultrasound for arterial access.  Widely patent coronary arteries without evidence of coronary artery dissection, significant atherosclerosis, or other ischemia producing anatomy.  Normal LV function.  No wall motion abnormality.  Normal LVEDP.  Elevated troponin secondary to myocardial injury type II/demand

## 2019-06-01 NOTE — Progress Notes (Addendum)
Pt has cardiac catheterization scheduled in the morning. Aspirin ordered prior to procedure. Pt allergic to aspirin, MD on call notified.  RN will inform day shift RN, attending and cardiac cath lab in am when they call to get report.

## 2019-06-01 NOTE — Discharge Instructions (Addendum)
Thank you for allowing [place] to participate in your care!    You were admitted for an asthma exacerbation.  You will need to continue prednisone for 3 more days, 40 mg (2 tablets) daily at breakfast.  You also need to continue taking your Symbicort twice daily every day.  You will also need to take your albuterol every 4 hours as needed for his wheezing or shortness of breath. We have refilled your prescription for your nebulizer treatment. Please use either your albuterol inhaler OR the nebulizer treatment but you should not use both at the same time. It is important that you follow-up with your primary care doctor.  While admitted there was concerned that you were having some strain on your heart due to your asthma attack.  You had a cardiac catheterization which showed no disease in your heart.  They did recommend however that you start taking a cholesterol medication for prevention daily- Please take rosuvastatin 10 mg daily.  If you experience worsening of your admission symptoms, develop shortness of breath, life threatening emergency, suicidal or homicidal thoughts you must seek medical attention immediately by calling 911 or calling your MD immediately  if symptoms less severe.   Asthma, Adult  Asthma is a long-term (chronic) condition in which the airways get tight and narrow. The airways are the breathing passages that lead from the nose and mouth down into the lungs. A person with asthma will have times when symptoms get worse. These are called asthma attacks. They can cause coughing, whistling sounds when you breathe (wheezing), shortness of breath, and chest pain. They can make it hard to breathe. There is no cure for asthma, but medicines and lifestyle changes can help control it. There are many things that can bring on an asthma attack or make asthma symptoms worse (triggers). Common triggers include:  Mold.  Dust.  Cigarette smoke.  Cockroaches.  Things that can cause allergy  symptoms (allergens). These include animal skin flakes (dander) and pollen from trees or grass.  Things that pollute the air. These may include household cleaners, wood smoke, smog, or chemical odors.  Cold air, weather changes, and wind.  Crying or laughing hard.  Stress.  Certain medicines or drugs.  Certain foods such as dried fruit, potato chips, and grape juice.  Infections, such as a cold or the flu.  Certain medical conditions or diseases.  Exercise or tiring activities. Asthma may be treated with medicines and by staying away from the things that cause asthma attacks. Types of medicines may include:  Controller medicines. These help prevent asthma symptoms. They are usually taken every day.  Fast-acting reliever or rescue medicines. These quickly relieve asthma symptoms. They are used as needed and provide short-term relief.  Allergy medicines if your attacks are brought on by allergens.  Medicines to help control the body's defense (immune) system. Follow these instructions at home: Avoiding triggers in your home  Change your heating and air conditioning filter often.  Limit your use of fireplaces and wood stoves.  Get rid of pests (such as roaches and mice) and their droppings.  Throw away plants if you see mold on them.  Clean your floors. Dust regularly. Use cleaning products that do not smell.  Have someone vacuum when you are not home. Use a vacuum cleaner with a HEPA filter if possible.  Replace carpet with wood, tile, or vinyl flooring. Carpet can trap animal skin flakes and dust.  Use allergy-proof pillows, mattress covers, and box spring covers.  Wash bed sheets and blankets every week in hot water. Dry them in a dryer.  Keep your bedroom free of any triggers.  Avoid pets and keep windows closed when things that cause allergy symptoms are in the air.  Use blankets that are made of polyester or cotton.  Clean bathrooms and kitchens with bleach.  If possible, have someone repaint the walls in these rooms with mold-resistant paint. Keep out of the rooms that are being cleaned and painted.  Wash your hands often with soap and water. If soap and water are not available, use hand sanitizer.  Do not allow anyone to smoke in your home. General instructions  Take over-the-counter and prescription medicines only as told by your doctor. ? Talk with your doctor if you have questions about how or when to take your medicines. ? Make note if you need to use your medicines more often than usual.  Do not use any products that contain nicotine or tobacco, such as cigarettes and e-cigarettes. If you need help quitting, ask your doctor.  Stay away from secondhand smoke.  Avoid doing things outdoors when allergen counts are high and when air quality is low.  Wear a ski mask when doing outdoor activities in the winter. The mask should cover your nose and mouth. Exercise indoors on cold days if you can.  Warm up before you exercise. Take time to cool down after exercise.  Use a peak flow meter as told by your doctor. A peak flow meter is a tool that measures how well the lungs are working.  Keep track of the peak flow meter's readings. Write them down.  Follow your asthma action plan. This is a written plan for taking care of your asthma and treating your attacks.  Make sure you get all the shots (vaccines) that your doctor recommends. Ask your doctor about a flu shot and a pneumonia shot.  Keep all follow-up visits as told by your doctor. This is important. Contact a doctor if:  You have wheezing, shortness of breath, or a cough even while taking medicine to prevent attacks.  The mucus you cough up (sputum) is thicker than usual.  The mucus you cough up changes from clear or white to yellow, green, gray, or bloody.  You have problems from the medicine you are taking, such as: ? A rash. ? Itching. ? Swelling. ? Trouble breathing.  You  need reliever medicines more than 2-3 times a week.  Your peak flow reading is still at 50-79% of your personal best after following the action plan for 1 hour.  You have a fever. Get help right away if:  You seem to be worse and are not responding to medicine during an asthma attack.  You are short of breath even at rest.  You get short of breath when doing very little activity.  You have trouble eating, drinking, or talking.  You have chest pain or tightness.  You have a fast heartbeat.  Your lips or fingernails start to turn blue.  You are light-headed or dizzy, or you faint.  Your peak flow is less than 50% of your personal best.  You feel too tired to breathe normally. Summary  Asthma is a long-term (chronic) condition in which the airways get tight and narrow. An asthma attack can make it hard to breathe.  Asthma cannot be cured, but medicines and lifestyle changes can help control it.  Make sure you understand how to avoid triggers and how and when  to use your medicines. This information is not intended to replace advice given to you by your health care provider. Make sure you discuss any questions you have with your health care provider. Document Released: 03/11/2008 Document Revised: 11/26/2018 Document Reviewed: 10/28/2016 Elsevier Patient Education  2020 [employer].

## 2019-06-04 LAB — CULTURE, BLOOD (ROUTINE X 2)
Culture: NO GROWTH
Culture: NO GROWTH

## 2019-06-24 DIAGNOSIS — M25532 Pain in left wrist: Secondary | ICD-10-CM | POA: Diagnosis not present

## 2019-06-24 DIAGNOSIS — S52572A Other intraarticular fracture of lower end of left radius, initial encounter for closed fracture: Secondary | ICD-10-CM | POA: Diagnosis not present

## 2019-06-29 ENCOUNTER — Encounter: Payer: Self-pay | Admitting: Nurse Practitioner

## 2019-06-30 ENCOUNTER — Other Ambulatory Visit: Payer: Self-pay | Admitting: Family Medicine

## 2019-07-15 DIAGNOSIS — S52572A Other intraarticular fracture of lower end of left radius, initial encounter for closed fracture: Secondary | ICD-10-CM | POA: Diagnosis not present

## 2019-07-15 DIAGNOSIS — M25532 Pain in left wrist: Secondary | ICD-10-CM | POA: Diagnosis not present

## 2019-08-10 ENCOUNTER — Telehealth: Payer: Self-pay

## 2019-08-10 NOTE — Telephone Encounter (Signed)
I called patient to schedule her an appointment and the number on the [facility] notes was disconnected. YRL,RMA

## 2019-08-12 DIAGNOSIS — S52572A Other intraarticular fracture of lower end of left radius, initial encounter for closed fracture: Secondary | ICD-10-CM | POA: Diagnosis not present

## 2019-09-15 ENCOUNTER — Other Ambulatory Visit: Payer: Self-pay

## 2019-09-15 ENCOUNTER — Ambulatory Visit: Payer: Medicare HMO | Admitting: Internal Medicine

## 2019-09-15 ENCOUNTER — Encounter: Payer: Self-pay | Admitting: Nurse Practitioner

## 2019-09-15 ENCOUNTER — Ambulatory Visit (INDEPENDENT_AMBULATORY_CARE_PROVIDER_SITE_OTHER): Payer: Medicare HMO

## 2019-09-15 ENCOUNTER — Ambulatory Visit (INDEPENDENT_AMBULATORY_CARE_PROVIDER_SITE_OTHER): Payer: Medicare HMO | Admitting: Nurse Practitioner

## 2019-09-15 VITALS — BP 122/74 | HR 82 | Temp 98.2°F | Ht 59.61 in | Wt 89.0 lb

## 2019-09-15 VITALS — BP 122/74 | HR 82 | Temp 98.2°F | Ht 59.6 in | Wt 89.2 lb

## 2019-09-15 DIAGNOSIS — Z23 Encounter for immunization: Secondary | ICD-10-CM

## 2019-09-15 DIAGNOSIS — E559 Vitamin D deficiency, unspecified: Secondary | ICD-10-CM | POA: Diagnosis not present

## 2019-09-15 DIAGNOSIS — Z8781 Personal history of (healed) traumatic fracture: Secondary | ICD-10-CM

## 2019-09-15 DIAGNOSIS — Z1159 Encounter for screening for other viral diseases: Secondary | ICD-10-CM

## 2019-09-15 DIAGNOSIS — F419 Anxiety disorder, unspecified: Secondary | ICD-10-CM | POA: Diagnosis not present

## 2019-09-15 DIAGNOSIS — Z1211 Encounter for screening for malignant neoplasm of colon: Secondary | ICD-10-CM | POA: Diagnosis not present

## 2019-09-15 DIAGNOSIS — J4550 Severe persistent asthma, uncomplicated: Secondary | ICD-10-CM | POA: Diagnosis not present

## 2019-09-15 DIAGNOSIS — E785 Hyperlipidemia, unspecified: Secondary | ICD-10-CM

## 2019-09-15 DIAGNOSIS — J4520 Mild intermittent asthma, uncomplicated: Secondary | ICD-10-CM

## 2019-09-15 DIAGNOSIS — E2839 Other primary ovarian failure: Secondary | ICD-10-CM

## 2019-09-15 DIAGNOSIS — Z1231 Encounter for screening mammogram for malignant neoplasm of breast: Secondary | ICD-10-CM

## 2019-09-15 DIAGNOSIS — Z0000 Encounter for general adult medical examination without abnormal findings: Secondary | ICD-10-CM | POA: Diagnosis not present

## 2019-09-15 MED ORDER — ROSUVASTATIN CALCIUM 10 MG PO TABS: 10.0000 mg | ORAL_TABLET | Freq: Every day | ORAL | 1 refills | Status: DC

## 2019-09-15 MED ORDER — BUDESONIDE-FORMOTEROL FUMARATE 160-4.5 MCG/ACT IN AERO: 2.0000 | INHALATION_SPRAY | Freq: Two times a day (BID) | RESPIRATORY_TRACT | 6 refills | Status: DC

## 2019-09-15 MED ORDER — ALBUTEROL SULFATE HFA 108 (90 BASE) MCG/ACT IN AERS: 2.0000 | INHALATION_SPRAY | RESPIRATORY_TRACT | 3 refills | Status: DC | PRN

## 2019-09-15 MED ORDER — MONTELUKAST SODIUM 10 MG PO TABS: 10.0000 mg | ORAL_TABLET | Freq: Every day | ORAL | 1 refills | Status: DC

## 2019-09-15 MED ORDER — LORATADINE 10 MG PO TABS: 10.0000 mg | ORAL_TABLET | Freq: Every day | ORAL | 1 refills | Status: DC

## 2019-09-15 NOTE — Patient Instructions (Signed)
Stacey Brock , Thank you for taking time to come for your Medicare Wellness Visit. I appreciate your ongoing commitment to your health goals. Please review the following plan we discussed and let me know if I can assist you in the future.   Screening recommendations/referrals: Colonoscopy: referred Mammogram: referred Bone Density: n/a Recommended yearly ophthalmology/optometry visit for glaucoma screening and checkup Recommended yearly dental visit for hygiene and checkup  Vaccinations: Influenza vaccine: today Pneumococcal vaccine: 06/2013 Tdap vaccine: due Shingles vaccine: discussed    Advanced directives: Advance directive discussed with you today. Even though you declined this today please call our office should you change your mind and we can give you the proper paperwork for you to fill out.   Conditions/risks identified: underweight  Next appointment:   Preventive Care 40-64 Years, Female Preventive care refers to lifestyle choices and visits with your health care provider that can promote health and wellness. What does preventive care include?  A yearly physical exam. This is also called an annual well check.  Dental exams once or twice a year.  Routine eye exams. Ask your health care provider how often you should have your eyes checked.  Personal lifestyle choices, including:  Daily care of your teeth and gums.  Regular physical activity.  Eating a healthy diet.  Avoiding tobacco and drug use.  Limiting alcohol use.  Practicing safe sex.  Taking low-dose aspirin daily starting at age 12.  Taking vitamin and mineral supplements as recommended by your health care provider. What happens during an annual well check? The services and screenings done by your health care provider during your annual well check will depend on your age, overall health, lifestyle risk factors, and family history of disease. Counseling  Your health care provider may ask you questions  about your:  Alcohol use.  Tobacco use.  Drug use.  Emotional well-being.  Home and relationship well-being.  Sexual activity.  Eating habits.  Work and work Statistician.  Method of birth control.  Menstrual cycle.  Pregnancy history. Screening  You may have the following tests or measurements:  Height, weight, and BMI.  Blood pressure.  Lipid and cholesterol levels. These may be checked every 5 years, or more frequently if you are over 32 years old.  Skin check.  Lung cancer screening. You may have this screening every year starting at age 50 if you have a 30-pack-year history of smoking and currently smoke or have quit within the past 15 years.  Fecal occult blood test (FOBT) of the stool. You may have this test every year starting at age 11.  Flexible sigmoidoscopy or colonoscopy. You may have a sigmoidoscopy every 5 years or a colonoscopy every 10 years starting at age 99.  Hepatitis C blood test.  Hepatitis B blood test.  Sexually transmitted disease (STD) testing.  Diabetes screening. This is done by checking your blood sugar (glucose) after you have not eaten for a while (fasting). You may have this done every 1-3 years.  Mammogram. This may be done every 1-2 years. Talk to your health care provider about when you should start having regular mammograms. This may depend on whether you have a family history of breast cancer.  BRCA-related cancer screening. This may be done if you have a family history of breast, ovarian, tubal, or peritoneal cancers.  Pelvic exam and Pap test. This may be done every 3 years starting at age 52. Starting at age 79, this may be done every 5 years if you  have a Pap test in combination with an HPV test.  Bone density scan. This is done to screen for osteoporosis. You may have this scan if you are at high risk for osteoporosis. Discuss your test results, treatment options, and if necessary, the need for more tests with your  health care provider. Vaccines  Your health care provider may recommend certain vaccines, such as:  Influenza vaccine. This is recommended every year.  Tetanus, diphtheria, and acellular pertussis (Tdap, Td) vaccine. You may need a Td booster every 10 years.  Zoster vaccine. You may need this after age 40.  Pneumococcal 13-valent conjugate (PCV13) vaccine. You may need this if you have certain conditions and were not previously vaccinated.  Pneumococcal polysaccharide (PPSV23) vaccine. You may need one or two doses if you smoke cigarettes or if you have certain conditions. Talk to your health care provider about which screenings and vaccines you need and how often you need them. This information is not intended to replace advice given to you by your health care provider. Make sure you discuss any questions you have with your health care provider. Document Released: 10/20/2015 Document Revised: 06/12/2016 Document Reviewed: 07/25/2015 Elsevier Interactive Patient Education  2017 [employer] Prevention in the Home Falls can cause injuries. They can happen to people of all ages. There are many things you can do to make your home safe and to help prevent falls. What can I do on the outside of my home?  Regularly fix the edges of walkways and driveways and fix any cracks.  Remove anything that might make you trip as you walk through a door, such as a raised step or threshold.  Trim any bushes or trees on the path to your home.  Use bright outdoor lighting.  Clear any walking paths of anything that might make someone trip, such as rocks or tools.  Regularly check to see if handrails are loose or broken. Make sure that both sides of any steps have handrails.  Any raised decks and porches should have guardrails on the edges.  Have any leaves, snow, or ice cleared regularly.  Use sand or salt on walking paths during winter.  Clean up any spills in your garage right away.  This includes oil or grease spills. What can I do in the bathroom?  Use night lights.  Install grab bars by the toilet and in the tub and shower. Do not use towel bars as grab bars.  Use non-skid mats or decals in the tub or shower.  If you need to sit down in the shower, use a plastic, non-slip stool.  Keep the floor dry. Clean up any water that spills on the floor as soon as it happens.  Remove soap buildup in the tub or shower regularly.  Attach bath mats securely with double-sided non-slip rug tape.  Do not have throw rugs and other things on the floor that can make you trip. What can I do in the bedroom?  Use night lights.  Make sure that you have a light by your bed that is easy to reach.  Do not use any sheets or blankets that are too big for your bed. They should not hang down onto the floor.  Have a firm chair that has side arms. You can use this for support while you get dressed.  Do not have throw rugs and other things on the floor that can make you trip. What can I do in the kitchen?  Clean up any spills right away.  Avoid walking on wet floors.  Keep items that you use a lot in easy-to-reach places.  If you need to reach something above you, use a strong step stool that has a grab bar.  Keep electrical cords out of the way.  Do not use floor polish or wax that makes floors slippery. If you must use wax, use non-skid floor wax.  Do not have throw rugs and other things on the floor that can make you trip. What can I do with my stairs?  Do not leave any items on the stairs.  Make sure that there are handrails on both sides of the stairs and use them. Fix handrails that are broken or loose. Make sure that handrails are as long as the stairways.  Check any carpeting to make sure that it is firmly attached to the stairs. Fix any carpet that is loose or worn.  Avoid having throw rugs at the top or bottom of the stairs. If you do have throw rugs, attach them  to the floor with carpet tape.  Make sure that you have a light switch at the top of the stairs and the bottom of the stairs. If you do not have them, ask someone to add them for you. What else can I do to help prevent falls?  Wear shoes that:  Do not have high heels.  Have rubber bottoms.  Are comfortable and fit you well.  Are closed at the toe. Do not wear sandals.  If you use a stepladder:  Make sure that it is fully opened. Do not climb a closed stepladder.  Make sure that both sides of the stepladder are locked into place.  Ask someone to hold it for you, if possible.  Clearly mark and make sure that you can see:  Any grab bars or handrails.  First and last steps.  Where the edge of each step is.  Use tools that help you move around (mobility aids) if they are needed. These include:  Canes.  Walkers.  Scooters.  Crutches.  Turn on the lights when you go into a dark area. Replace any light bulbs as soon as they burn out.  Set up your furniture so you have a clear path. Avoid moving your furniture around.  If any of your floors are uneven, fix them.  If there are any pets around you, be aware of where they are.  Review your medicines with your doctor. Some medicines can make you feel dizzy. This can increase your chance of falling. Ask your doctor what other things that you can do to help prevent falls. This information is not intended to replace advice given to you by your health care provider. Make sure you discuss any questions you have with your health care provider. Document Released: 07/20/2009 Document Revised: 02/29/2016 Document Reviewed: 10/28/2014 Elsevier Interactive Patient Education  2017 [employer].
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
## 2019-09-16 LAB — CMP14+EGFR
ALT: 13 IU/L (ref 0–32)
AST: 20 IU/L (ref 0–40)
Albumin/Globulin Ratio: 2.2 (ref 1.2–2.2)
Albumin: 4.8 g/dL (ref 3.8–4.9)
Alkaline Phosphatase: 79 IU/L (ref 39–117)
BUN/Creatinine Ratio: 14 (ref 12–28)
BUN: 8 mg/dL (ref 8–27)
Bilirubin Total: 0.8 mg/dL (ref 0.0–1.2)
CO2: 27 mmol/L (ref 20–29)
Calcium: 9.4 mg/dL (ref 8.7–10.3)
Chloride: 99 mmol/L (ref 96–106)
Creatinine, Ser: 0.56 mg/dL — ABNORMAL LOW (ref 0.57–1.00)
GFR calc Af Amer: 117 mL/min/{1.73_m2} (ref >59)
GFR calc non Af Amer: 102 mL/min/{1.73_m2} (ref >59)
Globulin, Total: 2.2 g/dL (ref 1.5–4.5)
Glucose: 88 mg/dL (ref 65–99)
Potassium: 3.7 mmol/L (ref 3.5–5.2)
Sodium: 142 mmol/L (ref 134–144)
Total Protein: 7 g/dL (ref 6.0–8.5)

## 2019-09-16 LAB — LIPID PANEL
Chol/HDL Ratio: 3.8 ratio (ref 0.0–4.4)
Cholesterol, Total: 286 mg/dL — ABNORMAL HIGH (ref 100–199)
HDL: 76 mg/dL (ref >39)
LDL Chol Calc (NIH): 204 mg/dL — ABNORMAL HIGH (ref 0–99)
Triglycerides: 49 mg/dL (ref 0–149)
VLDL Cholesterol Cal: 6 mg/dL (ref 5–40)

## 2019-09-16 LAB — VITAMIN D 25 HYDROXY (VIT D DEFICIENCY, FRACTURES): Vit D, 25-Hydroxy: 20.5 ng/mL — ABNORMAL LOW (ref 30.0–100.0)

## 2019-09-16 LAB — HEPATITIS C ANTIBODY: Hep C Virus Ab: <0.1 s/co ratio (ref 0.0–0.9)

## 2019-09-22 MED ORDER — TETANUS-DIPHTH-ACELL PERTUSSIS 5-2.5-18.5 LF-MCG/0.5 IM SUSP: 0.5000 mL | Freq: Once | INTRAMUSCULAR | 0 refills | Status: AC

## 2019-09-22 NOTE — Addendum Note (Signed)
Addended by: Minette Brine F on: 09/22/2019 10:28 PM   Modules accepted: Orders

## 2019-11-18 ENCOUNTER — Encounter: Payer: Self-pay | Admitting: Nurse Practitioner

## 2020-01-17 ENCOUNTER — Other Ambulatory Visit: Payer: Self-pay

## 2020-01-17 ENCOUNTER — Encounter: Payer: Self-pay | Admitting: Nurse Practitioner

## 2020-01-17 ENCOUNTER — Ambulatory Visit (INDEPENDENT_AMBULATORY_CARE_PROVIDER_SITE_OTHER): Payer: Medicare HMO | Admitting: Nurse Practitioner

## 2020-01-17 VITALS — BP 112/78 | HR 120 | Temp 98.1°F | Ht <= 58 in | Wt 90.4 lb

## 2020-01-17 DIAGNOSIS — R945 Abnormal results of liver function studies: Secondary | ICD-10-CM | POA: Diagnosis not present

## 2020-01-17 DIAGNOSIS — E785 Hyperlipidemia, unspecified: Secondary | ICD-10-CM | POA: Diagnosis not present

## 2020-01-17 DIAGNOSIS — M545 Low back pain, unspecified: Secondary | ICD-10-CM

## 2020-01-17 DIAGNOSIS — Z0000 Encounter for general adult medical examination without abnormal findings: Secondary | ICD-10-CM | POA: Diagnosis not present

## 2020-01-17 DIAGNOSIS — J4520 Mild intermittent asthma, uncomplicated: Secondary | ICD-10-CM | POA: Diagnosis not present

## 2020-01-17 DIAGNOSIS — J45909 Unspecified asthma, uncomplicated: Secondary | ICD-10-CM | POA: Diagnosis not present

## 2020-01-17 DIAGNOSIS — Z23 Encounter for immunization: Secondary | ICD-10-CM

## 2020-01-17 DIAGNOSIS — E559 Vitamin D deficiency, unspecified: Secondary | ICD-10-CM

## 2020-01-17 MED ORDER — TRIAMCINOLONE ACETONIDE 40 MG/ML IJ SUSP
60.0000 mg | Freq: Once | INTRAMUSCULAR | Status: AC
  Administered 2020-01-17: 17:00:00 60 mg via INTRAMUSCULAR

## 2020-01-17 MED ORDER — TETANUS-DIPHTH-ACELL PERTUSSIS 5-2.5-18.5 LF-MCG/0.5 IM SUSP: 0.5000 mL | Freq: Once | INTRAMUSCULAR | 0 refills | Status: AC

## 2020-01-17 NOTE — Progress Notes (Signed)
Subjective:     Patient ID: Stacey Brock , female    DOB: 09-05-1961 , 59 y.o.   MRN: [id number]   Chief Complaint  Patient presents with  . Annual Exam  . Asthma    HPI  Here for HM She is walking increased amounts of time.   She is currently sleeping on the floor.   Hesitant about getting the covid vaccine.    Asthma There is no chest tightness. This is a chronic problem. The current episode started more than 1 year ago. The problem occurs intermittently. The problem has been unchanged. Pertinent negatives include no appetite change, chest pain or headaches. Her symptoms are alleviated by ipratropium. She reports no improvement on treatment. Her past medical history is significant for asthma.  Back Pain This is a new problem. The current episode started 1 to 4 weeks ago (2 weeks ago). Pertinent negatives include no chest pain or headaches.    The patient states she is post menopausal status.  Negative for Dysmenorrhea and Negative for Menorrhagia Mammogram last done more than 3 years ago. Negative for: breast discharge, breast lump(s), breast pain and breast self exam.  Pertinent negatives include abnormal bleeding (hematology), anxiety, decreased libido, depression, difficulty falling sleep, dyspareunia, history of infertility, nocturia, sexual dysfunction, sleep disturbances, urinary incontinence, urinary urgency, vaginal discharge and vaginal itching. Diet regular.  The patient states her exercise level is minimal - with walking.    The patient's tobacco use is:  Social History   Tobacco Use  Smoking Status Never Smoker  Smokeless Tobacco Never Used   She has been exposed to passive smoke. The patient's alcohol use is:  Social History   Substance and Sexual Activity  Alcohol Use No   Additional information: Past Medical History:  Diagnosis Date  . Asthma   . Sinus trouble      Family History  Problem Relation Age of Onset  . Asthma Sister   . Cancer Father         colon     Current Outpatient Medications:  .  albuterol (PROAIR HFA) 108 (90 Base) MCG/ACT inhaler, Inhale 2 puffs into the lungs every 4 (four) hours as needed for wheezing., Disp: 18 g, Rfl: 3 .  albuterol (PROVENTIL) (2.5 MG/3ML) 0.083% nebulizer solution, Take 3 mLs (2.5 mg total) by nebulization every 6 (six) hours as needed for wheezing or shortness of breath., Disp: 75 mL, Rfl: 12 .  budesonide-formoterol (SYMBICORT) 160-4.5 MCG/ACT inhaler, Inhale 2 puffs into the lungs 2 (two) times daily., Disp: 10.2 g, Rfl: 6 .  loratadine (CLARITIN) 10 MG tablet, Take 1 tablet (10 mg total) by mouth at bedtime., Disp: 90 tablet, Rfl: 1 .  montelukast (SINGULAIR) 10 MG tablet, Take 1 tablet (10 mg total) by mouth at bedtime., Disp: 90 tablet, Rfl: 1 .  rosuvastatin (CRESTOR) 10 MG tablet, Take 1 tablet (10 mg total) by mouth daily at 6 PM., Disp: 90 tablet, Rfl: 1 .  Vitamin D, Ergocalciferol, (DRISDOL) 1.25 MG (50000 UT) CAPS capsule, Take 1 capsule (50,000 Units total) by mouth 2 (two) times a week., Disp: 8 capsule, Rfl: 3   Allergies  Allergen Reactions  . Aspirin Other (See Comments)    Pt cannot specify reaction type and unsure if true allergy, but she states " I almost died after I had Aspirin, my heart stopped on operating table".   . Ibuprofen Shortness Of Breath    Unsure if this is a true allergy. Patient presents with  sweating, tachy, dizziness, flushing     Review of Systems  Constitutional: Negative.  Negative for appetite change and fatigue.  HENT: Negative.   Eyes: Negative.   Respiratory: Negative.   Cardiovascular: Negative.  Negative for chest pain, palpitations and leg swelling.  Gastrointestinal: Negative.   Endocrine: Negative.   Genitourinary: Negative.   Musculoskeletal: Positive for back pain.  Skin: Negative.   Allergic/Immunologic: Negative.   Neurological: Negative.  Negative for dizziness, light-headedness and headaches.  Hematological: Negative.    Psychiatric/Behavioral: Negative.      Today's Vitals   01/17/20 1423  BP: 112/78  Pulse: (!) 120  Temp: 98.1 F (36.7 C)  TempSrc: Oral  Weight: 90 lb 6.4 oz (41 kg)  Height: 4' 9.6" (1.463 m)  PainSc: 0-No pain   Body mass index is 19.16 kg/m.   Objective:  Physical Exam Vitals reviewed.  Constitutional:      General: She is not in acute distress.    Appearance: Normal appearance.  HENT:     Head: Normocephalic and atraumatic.  Eyes:     Extraocular Movements: Extraocular movements intact.     Conjunctiva/sclera: Conjunctivae normal.     Pupils: Pupils are equal, round, and reactive to light.  Cardiovascular:     Rate and Rhythm: Normal rate and regular rhythm.     Pulses: Normal pulses.     Heart sounds: Normal heart sounds. No murmur.  Pulmonary:     Effort: Pulmonary effort is normal. No respiratory distress.     Breath sounds: Normal breath sounds.     Comments: She is doing better this visit with her breathing Abdominal:     General: Abdomen is flat. Bowel sounds are normal. There is no distension.     Palpations: Abdomen is soft.  Musculoskeletal:        General: No tenderness.     Cervical back: Normal range of motion.     Comments: She has a curvature to her spine  Skin:    General: Skin is warm and dry.     Capillary Refill: Capillary refill takes less than 2 seconds.  Neurological:     General: No focal deficit present.     Mental Status: She is alert and oriented to person, place, and time.  Psychiatric:        Mood and Affect: Mood normal.        Behavior: Behavior normal.        Thought Content: Thought content normal.        Judgment: Judgment normal.         Assessment And Plan:  1. Acute left-sided low back pain without sciatica  She has persistent back pain will treat with kenalog and refer to orthopedic as she has a curvature to her spine and hip - triamcinolone acetonide (KENALOG-40) injection 60 mg - Ambulatory referral to  Orthopedic Surgery  2. Vitamin D deficiency  Will check vitamin D level and supplement as needed.     Also encouraged to spend 15 minutes in the sun daily.  - VITAMIN D 25 Hydroxy (Vit-D Deficiency, Fractures)  3. Hyperlipidemia, unspecified hyperlipidemia type  Chronic, controlled  Continue with current medications - Lipid panel - CMP14+EGFR  4. Mild intermittent asthma without complication  Chronic, she is doing well and stable - CMP14+EGFR  5. Health maintenance examination . Behavior modifications discussed and diet history reviewed.   . Pt will continue to exercise regularly and modify diet with low GI, plant based foods and  decrease intake of processed foods.  . Recommend intake of daily multivitamin, Vitamin D, and calcium.  . Recommend for preventive screenings, as well as recommend immunizations that include influenza, TDAP - CBC  6. Encounter for immunization  TDAP will be administered to adults 43-22 years old every 10 years. - Tdap (BOOSTRIX) 5-2.5-18.5 LF-MCG/0.5 injection; Inject 0.5 mLs into the muscle once for 1 dose.  Dispense: 0.5 mL; Refill: 0     Minette Brine, FNP    THE PATIENT IS ENCOURAGED TO PRACTICE SOCIAL DISTANCING DUE TO THE COVID-19 PANDEMIC.

## 2020-01-17 NOTE — Patient Instructions (Signed)
Health Maintenance, Female Adopting a healthy lifestyle and getting preventive care are important in promoting health and wellness. Ask your health care provider about:  The right schedule for you to have regular tests and exams.  Things you can do on your own to prevent diseases and keep yourself healthy. What should I know about diet, weight, and exercise? Eat a healthy diet   Eat a diet that includes plenty of vegetables, fruits, low-fat dairy products, and lean protein.  Do not eat a lot of foods that are high in solid fats, added sugars, or sodium. Maintain a healthy weight Body mass index (BMI) is used to identify weight problems. It estimates body fat based on height and weight. Your health care provider can help determine your BMI and help you achieve or maintain a healthy weight. Get regular exercise Get regular exercise. This is one of the most important things you can do for your health. Most adults should:  Exercise for at least 150 minutes each week. The exercise should increase your heart rate and make you sweat (moderate-intensity exercise).  Do strengthening exercises at least twice a week. This is in addition to the moderate-intensity exercise.  Spend less time sitting. Even light physical activity can be beneficial. Watch cholesterol and blood lipids Have your blood tested for lipids and cholesterol at 61 years of age, then have this test every 5 years. Have your cholesterol levels checked more often if:  Your lipid or cholesterol levels are high.  You are older than 61 years of age.  You are at high risk for heart disease. What should I know about cancer screening? Depending on your health history and family history, you may need to have cancer screening at various ages. This may include screening for:  Breast cancer.  Cervical cancer.  Colorectal cancer.  Skin cancer.  Lung cancer. What should I know about heart disease, diabetes, and high blood  pressure? Blood pressure and heart disease  High blood pressure causes heart disease and increases the risk of stroke. This is more likely to develop in people who have high blood pressure readings, are of African descent, or are overweight.  Have your blood pressure checked: ? Every 3-5 years if you are 18-39 years of age. ? Every year if you are 40 years old or older. Diabetes Have regular diabetes screenings. This checks your fasting blood sugar level. Have the screening done:  Once every three years after age 40 if you are at a normal weight and have a low risk for diabetes.  More often and at a younger age if you are overweight or have a high risk for diabetes. What should I know about preventing infection? Hepatitis B If you have a higher risk for hepatitis B, you should be screened for this virus. Talk with your health care provider to find out if you are at risk for hepatitis B infection. Hepatitis C Testing is recommended for:  Everyone born from 1945 through 1965.  Anyone with known risk factors for hepatitis C. Sexually transmitted infections (STIs)  Get screened for STIs, including gonorrhea and chlamydia, if: ? You are sexually active and are younger than 61 years of age. ? You are older than 61 years of age and your health care provider tells you that you are at risk for this type of infection. ? Your sexual activity has changed since you were last screened, and you are at increased risk for chlamydia or gonorrhea. Ask your health care provider if   you are at risk.  Ask your health care provider about whether you are at high risk for HIV. Your health care provider may recommend a prescription medicine to help prevent HIV infection. If you choose to take medicine to prevent HIV, you should first get tested for HIV. You should then be tested every 3 months for as long as you are taking the medicine. Pregnancy  If you are about to stop having your period (premenopausal) and  you may become pregnant, seek counseling before you get pregnant.  Take 400 to 800 micrograms (mcg) of folic acid every day if you become pregnant.  Ask for birth control (contraception) if you want to prevent pregnancy. Osteoporosis and menopause Osteoporosis is a disease in which the bones lose minerals and strength with aging. This can result in bone fractures. If you are 29 years old or older, or if you are at risk for osteoporosis and fractures, ask your health care provider if you should:  Be screened for bone loss.  Take a calcium or vitamin D supplement to lower your risk of fractures.  Be given hormone replacement therapy (HRT) to treat symptoms of menopause. Follow these instructions at home: Lifestyle  Do not use any products that contain nicotine or tobacco, such as cigarettes, e-cigarettes, and chewing tobacco. If you need help quitting, ask your health care provider.  Do not use street drugs.  Do not share needles.  Ask your health care provider for help if you need support or information about quitting drugs. Alcohol use  Do not drink alcohol if: ? Your health care provider tells you not to drink. ? You are pregnant, may be pregnant, or are planning to become pregnant.  If you drink alcohol: ? Limit how much you use to 0-1 drink a day. ? Limit intake if you are breastfeeding.  Be aware of how much alcohol is in your drink. In the [place] one drink equals one 12 oz bottle of beer (355 mL), one 5 oz glass of wine (148 mL), or one 1 oz glass of hard liquor (44 mL). General instructions  Schedule regular health, dental, and eye exams.  Stay current with your vaccines.  Tell your health care provider if: ? You often feel depressed. ? You have ever been abused or do not feel safe at home. Summary  Adopting a healthy lifestyle and getting preventive care are important in promoting health and wellness.  Follow your health care provider's instructions about healthy  diet, exercising, and getting tested or screened for diseases.  Follow your health care provider's instructions on monitoring your cholesterol and blood pressure. This information is not intended to replace advice given to you by your health care provider. Make sure you discuss any questions you have with your health care provider. Document Revised: 09/16/2018 Document Reviewed: 09/16/2018 Elsevier Patient Education  2020 [place].   Back Exercises These exercises help to make your trunk and back strong. They also help to keep the lower back flexible. Doing these exercises can help to prevent back pain or lessen existing pain.  If you have back pain, try to do these exercises 2-3 times each day or as told by your doctor.  As you get better, do the exercises once each day. Repeat the exercises more often as told by your doctor.  To stop back pain from coming back, do the exercises once each day, or as told by your doctor. Exercises Single knee to chest Do these steps 3-5 times in  a row for each leg: 1. Lie on your back on a firm bed or the floor with your legs stretched out. 2. Bring one knee to your chest. 3. Grab your knee or thigh with both hands and hold them it in place. 4. Pull on your knee until you feel a gentle stretch in your lower back or buttocks. 5. Keep doing the stretch for 10-30 seconds. 6. Slowly let go of your leg and straighten it. Pelvic tilt Do these steps 5-10 times in a row: 1. Lie on your back on a firm bed or the floor with your legs stretched out. 2. Bend your knees so they point up to the ceiling. Your feet should be flat on the floor. 3. Tighten your lower belly (abdomen) muscles to press your lower back against the floor. This will make your tailbone point up to the ceiling instead of pointing down to your feet or the floor. 4. Stay in this position for 5-10 seconds while you gently tighten your muscles and breathe evenly. Cat-cow Do these steps until  your lower back bends more easily: 1. Get on your hands and knees on a firm surface. Keep your hands under your shoulders, and keep your knees under your hips. You may put padding under your knees. 2. Let your head hang down toward your chest. Tighten (contract) the muscles in your belly. Point your tailbone toward the floor so your lower back becomes rounded like the back of a cat. 3. Stay in this position for 5 seconds. 4. Slowly lift your head. Let the muscles of your belly relax. Point your tailbone up toward the ceiling so your back forms a sagging arch like the back of a cow. 5. Stay in this position for 5 seconds.  Press-ups Do these steps 5-10 times in a row: 1. Lie on your belly (face-down) on the floor. 2. Place your hands near your head, about shoulder-width apart. 3. While you keep your back relaxed and keep your hips on the floor, slowly straighten your arms to raise the top half of your body and lift your shoulders. Do not use your back muscles. You may change where you place your hands in order to make yourself more comfortable. 4. Stay in this position for 5 seconds. 5. Slowly return to lying flat on the floor.  Bridges Do these steps 10 times in a row: 1. Lie on your back on a firm surface. 2. Bend your knees so they point up to the ceiling. Your feet should be flat on the floor. Your arms should be flat at your sides, next to your body. 3. Tighten your butt muscles and lift your butt off the floor until your waist is almost as high as your knees. If you do not feel the muscles working in your butt and the back of your thighs, slide your feet 1-2 inches farther away from your butt. 4. Stay in this position for 3-5 seconds. 5. Slowly lower your butt to the floor, and let your butt muscles relax. If this exercise is too easy, try doing it with your arms crossed over your chest. Belly crunches Do these steps 5-10 times in a row: 1. Lie on your back on a firm bed or the floor  with your legs stretched out. 2. Bend your knees so they point up to the ceiling. Your feet should be flat on the floor. 3. Cross your arms over your chest. 4. Tip your chin a little bit toward your chest  but do not bend your neck. 5. Tighten your belly muscles and slowly raise your chest just enough to lift your shoulder blades a tiny bit off of the floor. Avoid raising your body higher than that, because it can put too much stress on your low back. 6. Slowly lower your chest and your head to the floor. Back lifts Do these steps 5-10 times in a row: 1. Lie on your belly (face-down) with your arms at your sides, and rest your forehead on the floor. 2. Tighten the muscles in your legs and your butt. 3. Slowly lift your chest off of the floor while you keep your hips on the floor. Keep the back of your head in line with the curve in your back. Look at the floor while you do this. 4. Stay in this position for 3-5 seconds. 5. Slowly lower your chest and your face to the floor. Contact a doctor if:  Your back pain gets a lot worse when you do an exercise.  Your back pain does not get better 2 hours after you exercise. If you have any of these problems, stop doing the exercises. Do not do them again unless your doctor says it is okay. Get help right away if:  You have sudden, very bad back pain. If this happens, stop doing the exercises. Do not do them again unless your doctor says it is okay. This information is not intended to replace advice given to you by your health care provider. Make sure you discuss any questions you have with your health care provider. Document Revised: 06/18/2018 Document Reviewed: 06/18/2018 Elsevier Patient Education  [place].  Acute Back Pain, Adult Acute back pain is sudden and usually short-lived. It is often caused by an injury to the muscles and tissues in the back. The injury may result from:  A muscle or ligament getting overstretched or torn  (strained). Ligaments are tissues that connect bones to each other. Lifting something improperly can cause a back strain.  Wear and tear (degeneration) of the spinal disks. Spinal disks are circular tissue that provides cushioning between the bones of the spine (vertebrae).  Twisting motions, such as while playing sports or doing yard work.  A hit to the back.  Arthritis. You may have a physical exam, lab tests, and imaging tests to find the cause of your pain. Acute back pain usually goes away with rest and home care. Follow these instructions at home: Managing pain, stiffness, and swelling  Take over-the-counter and prescription medicines only as told by your health care provider.  Your health care provider may recommend applying ice during the first 24-48 hours after your pain starts. To do this: ? Put ice in a plastic bag. ? Place a towel between your skin and the bag. ? Leave the ice on for 20 minutes, 2-3 times a day.  If directed, apply heat to the affected area as often as told by your health care provider. Use the heat source that your health care provider recommends, such as a moist heat pack or a heating pad. ? Place a towel between your skin and the heat source. ? Leave the heat on for 20-30 minutes. ? Remove the heat if your skin turns bright red. This is especially important if you are unable to feel pain, heat, or cold. You have a greater risk of getting burned. Activity   Do not stay in bed. Staying in bed for more than 1-2 days can delay your recovery.  Sit up and stand up straight. Avoid leaning forward when you sit, or hunching over when you stand. ? If you work at a desk, sit close to it so you do not need to lean over. Keep your chin tucked in. Keep your neck drawn back, and keep your elbows bent at a right angle. Your arms should look like the letter "L." ? Sit high and close to the steering wheel when you drive. Add lower back (lumbar) support to your car seat,  if needed.  Take short walks on even surfaces as soon as you are able. Try to increase the length of time you walk each day.  Do not sit, drive, or stand in one place for more than 30 minutes at a time. Sitting or standing for long periods of time can put stress on your back.  Do not drive or use heavy machinery while taking prescription pain medicine.  Use proper lifting techniques. When you bend and lift, use positions that put less stress on your back: ? [place] your knees. ? Keep the load close to your body. ? Avoid twisting.  Exercise regularly as told by your health care provider. Exercising helps your back heal faster and helps prevent back injuries by keeping muscles strong and flexible.  Work with a physical therapist to make a safe exercise program, as recommended by your health care provider. Do any exercises as told by your physical therapist. Lifestyle  Maintain a healthy weight. Extra weight puts stress on your back and makes it difficult to have good posture.  Avoid activities or situations that make you feel anxious or stressed. Stress and anxiety increase muscle tension and can make back pain worse. Learn ways to manage anxiety and stress, such as through exercise. General instructions  Sleep on a firm mattress in a comfortable position. Try lying on your side with your knees slightly bent. If you lie on your back, put a pillow under your knees.  Follow your treatment plan as told by your health care provider. This may include: ? Cognitive or behavioral therapy. ? Acupuncture or massage therapy. ? Meditation or yoga. Contact a health care provider if:  You have pain that is not relieved with rest or medicine.  You have increasing pain going down into your legs or buttocks.  Your pain does not improve after 2 weeks.  You have pain at night.  You lose weight without trying.  You have a fever or chills. Get help right away if:  You develop new bowel or bladder  control problems.  You have unusual weakness or numbness in your arms or legs.  You develop nausea or vomiting.  You develop abdominal pain.  You feel faint. Summary  Acute back pain is sudden and usually short-lived.  Use proper lifting techniques. When you bend and lift, use positions that put less stress on your back.  Take over-the-counter and prescription medicines and apply heat or ice as directed by your health care provider. This information is not intended to replace advice given to you by your health care provider. Make sure you discuss any questions you have with your health care provider. Document Revised: 01/12/2019 Document Reviewed: 05/07/2017 Elsevier Patient Education  [place].

## 2020-01-18 LAB — CBC
Hematocrit: 36.5 % (ref 34.0–46.6)
Hemoglobin: 11.6 g/dL (ref 11.1–15.9)
MCH: 26.9 pg (ref 26.6–33.0)
MCHC: 31.8 g/dL (ref 31.5–35.7)
MCV: 85 fL (ref 79–97)
Platelets: 407 10*3/uL (ref 150–450)
RBC: 4.32 x10E6/uL (ref 3.77–5.28)
RDW: 14.6 % (ref 11.7–15.4)
WBC: 5.6 10*3/uL (ref 3.4–10.8)

## 2020-01-18 LAB — LIPID PANEL
Chol/HDL Ratio: 3.4 ratio (ref 0.0–4.4)
Cholesterol, Total: 218 mg/dL — ABNORMAL HIGH (ref 100–199)
HDL: 65 mg/dL (ref >39)
LDL Chol Calc (NIH): 142 mg/dL — ABNORMAL HIGH (ref 0–99)
Triglycerides: 64 mg/dL (ref 0–149)
VLDL Cholesterol Cal: 11 mg/dL (ref 5–40)

## 2020-01-18 LAB — CMP14+EGFR
ALT: 94 IU/L — ABNORMAL HIGH (ref 0–32)
AST: 71 IU/L — ABNORMAL HIGH (ref 0–40)
Albumin/Globulin Ratio: 2 (ref 1.2–2.2)
Albumin: 5.1 g/dL — ABNORMAL HIGH (ref 3.8–4.9)
Alkaline Phosphatase: 102 IU/L (ref 39–117)
BUN/Creatinine Ratio: 16 (ref 12–28)
BUN: 11 mg/dL (ref 8–27)
Bilirubin Total: 0.7 mg/dL (ref 0.0–1.2)
CO2: 24 mmol/L (ref 20–29)
Calcium: 9.8 mg/dL (ref 8.7–10.3)
Chloride: 100 mmol/L (ref 96–106)
Creatinine, Ser: 0.67 mg/dL (ref 0.57–1.00)
GFR calc Af Amer: 110 mL/min/{1.73_m2} (ref >59)
GFR calc non Af Amer: 96 mL/min/{1.73_m2} (ref >59)
Globulin, Total: 2.6 g/dL (ref 1.5–4.5)
Glucose: 92 mg/dL (ref 65–99)
Potassium: 4.1 mmol/L (ref 3.5–5.2)
Sodium: 140 mmol/L (ref 134–144)
Total Protein: 7.7 g/dL (ref 6.0–8.5)

## 2020-01-18 LAB — VITAMIN D 25 HYDROXY (VIT D DEFICIENCY, FRACTURES): Vit D, 25-Hydroxy: 32.3 ng/mL (ref 30.0–100.0)

## 2020-01-20 LAB — HEPATITIS PANEL, ACUTE
Hep A IgM: NEGATIVE
Hep B C IgM: NEGATIVE
Hep C Virus Ab: <0.1 s/co ratio (ref 0.0–0.9)
Hepatitis B Surface Ag: NEGATIVE

## 2020-01-20 LAB — SPECIMEN STATUS REPORT

## 2020-01-20 LAB — GAMMA GT: GGT: 44 IU/L (ref 0–60)

## 2020-02-05 DIAGNOSIS — J45909 Unspecified asthma, uncomplicated: Secondary | ICD-10-CM | POA: Diagnosis not present

## 2020-02-16 DIAGNOSIS — J45909 Unspecified asthma, uncomplicated: Secondary | ICD-10-CM | POA: Diagnosis not present

## 2020-03-18 DIAGNOSIS — J45909 Unspecified asthma, uncomplicated: Secondary | ICD-10-CM | POA: Diagnosis not present

## 2020-03-21 ENCOUNTER — Other Ambulatory Visit: Payer: Self-pay

## 2020-03-21 DIAGNOSIS — J4550 Severe persistent asthma, uncomplicated: Secondary | ICD-10-CM

## 2020-03-21 MED ORDER — MONTELUKAST SODIUM 10 MG PO TABS: 10.0000 mg | ORAL_TABLET | Freq: Every day | ORAL | 1 refills | Status: DC

## 2020-03-22 ENCOUNTER — Other Ambulatory Visit: Payer: Self-pay

## 2020-03-22 MED ORDER — ROSUVASTATIN CALCIUM 10 MG PO TABS: 10.0000 mg | ORAL_TABLET | Freq: Every day | ORAL | 1 refills | Status: DC

## 2020-04-17 DIAGNOSIS — J45909 Unspecified asthma, uncomplicated: Secondary | ICD-10-CM | POA: Diagnosis not present

## 2020-05-18 DIAGNOSIS — J45909 Unspecified asthma, uncomplicated: Secondary | ICD-10-CM | POA: Diagnosis not present

## 2020-06-18 DIAGNOSIS — J45909 Unspecified asthma, uncomplicated: Secondary | ICD-10-CM | POA: Diagnosis not present

## 2020-07-18 ENCOUNTER — Encounter: Payer: Self-pay | Admitting: Nurse Practitioner

## 2020-07-18 ENCOUNTER — Other Ambulatory Visit: Payer: Self-pay

## 2020-07-18 ENCOUNTER — Ambulatory Visit (INDEPENDENT_AMBULATORY_CARE_PROVIDER_SITE_OTHER): Payer: Medicare HMO | Admitting: Nurse Practitioner

## 2020-07-18 VITALS — BP 118/62 | HR 101 | Temp 97.8°F | Ht <= 58 in | Wt 84.2 lb

## 2020-07-18 DIAGNOSIS — Z23 Encounter for immunization: Secondary | ICD-10-CM

## 2020-07-18 DIAGNOSIS — M21612 Bunion of left foot: Secondary | ICD-10-CM | POA: Diagnosis not present

## 2020-07-18 DIAGNOSIS — G8929 Other chronic pain: Secondary | ICD-10-CM

## 2020-07-18 DIAGNOSIS — J4520 Mild intermittent asthma, uncomplicated: Secondary | ICD-10-CM

## 2020-07-18 DIAGNOSIS — J45909 Unspecified asthma, uncomplicated: Secondary | ICD-10-CM | POA: Diagnosis not present

## 2020-07-18 DIAGNOSIS — M549 Dorsalgia, unspecified: Secondary | ICD-10-CM

## 2020-07-18 DIAGNOSIS — E785 Hyperlipidemia, unspecified: Secondary | ICD-10-CM | POA: Diagnosis not present

## 2020-07-18 MED ORDER — ROSUVASTATIN CALCIUM 10 MG PO TABS: 10.0000 mg | ORAL_TABLET | Freq: Every day | ORAL | 1 refills | Status: DC

## 2020-07-18 MED ORDER — MONTELUKAST SODIUM 10 MG PO TABS: 10.0000 mg | ORAL_TABLET | Freq: Every day | ORAL | 1 refills | Status: DC

## 2020-07-18 MED ORDER — ALBUTEROL SULFATE HFA 108 (90 BASE) MCG/ACT IN AERS: 2.0000 | INHALATION_SPRAY | RESPIRATORY_TRACT | 3 refills | Status: DC | PRN

## 2020-07-18 MED ORDER — LORATADINE 10 MG PO TABS: 10.0000 mg | ORAL_TABLET | Freq: Every day | ORAL | 1 refills | Status: DC

## 2020-07-18 MED ORDER — BUDESONIDE-FORMOTEROL FUMARATE 160-4.5 MCG/ACT IN AERO: 2.0000 | INHALATION_SPRAY | Freq: Two times a day (BID) | RESPIRATORY_TRACT | 6 refills | Status: DC

## 2020-07-18 NOTE — Progress Notes (Signed)
I,Yamilka Roman [employer] as a Education administrator for [employer], FNP.,have documented all relevant documentation on the behalf of Minette Brine, FNP,as directed by  Minette Brine, FNP while in the presence of Minette Brine, [place]. This visit occurred during the SARS-CoV-2 public health emergency.  Safety protocols were in place, including screening questions prior to the visit, additional usage of staff PPE, and extensive cleaning of exam room while observing appropriate contact time as indicated for disinfecting solutions.  Subjective:     Patient ID: Stacey Brock , female    DOB: 10/28/1960 , 59 y.o.   MRN: [id number]   Chief Complaint  Patient presents with  . Asthma    HPI  Patient here for a 6 month f/u on her asthma.  She is using her albuterol inhaler 1-2 days a week.  Asthma There is no difficulty breathing or wheezing. This is a chronic problem. The current episode started more than 1 year ago. The problem occurs intermittently. The problem has been unchanged. Her symptoms are alleviated by ipratropium. Her past medical history is significant for asthma.     Past Medical History:  Diagnosis Date  . Asthma   . Sinus trouble      Family History  Problem Relation Age of Onset  . Asthma Sister   . Cancer Father        colon     Current Outpatient Medications:  .  albuterol (PROAIR HFA) 108 (90 Base) MCG/ACT inhaler, Inhale 2 puffs into the lungs every 4 (four) hours as needed for wheezing., Disp: 18 g, Rfl: 3 .  albuterol (PROVENTIL) (2.5 MG/3ML) 0.083% nebulizer solution, Take 3 mLs (2.5 mg total) by nebulization every 6 (six) hours as needed for wheezing or shortness of breath., Disp: 75 mL, Rfl: 12 .  budesonide-formoterol (SYMBICORT) 160-4.5 MCG/ACT inhaler, Inhale 2 puffs into the lungs 2 (two) times daily., Disp: 10.2 g, Rfl: 6 .  loratadine (CLARITIN) 10 MG tablet, Take 1 tablet (10 mg total) by mouth at bedtime., Disp: 90 tablet, Rfl: 1 .  montelukast (SINGULAIR) 10 MG  tablet, Take 1 tablet (10 mg total) by mouth at bedtime., Disp: 90 tablet, Rfl: 1 .  rosuvastatin (CRESTOR) 10 MG tablet, Take 1 tablet (10 mg total) by mouth daily at 6 PM., Disp: 90 tablet, Rfl: 1 .  Vitamin D, Ergocalciferol, (DRISDOL) 1.25 MG (50000 UT) CAPS capsule, Take 1 capsule (50,000 Units total) by mouth 2 (two) times a week. (Patient not taking: Reported on 07/18/2020), Disp: 8 capsule, Rfl: 3   Allergies  Allergen Reactions  . Aspirin Other (See Comments)    Pt cannot specify reaction type and unsure if true allergy, but she states " I almost died after I had Aspirin, my heart stopped on operating table".   . Ibuprofen Shortness Of Breath    Unsure if this is a true allergy. Patient presents with sweating, tachy, dizziness, flushing     Review of Systems  Constitutional: Negative.   Respiratory: Negative.  Negative for wheezing.   Cardiovascular: Negative.   Gastrointestinal: Negative.   Genitourinary: Negative.   Musculoskeletal: Negative.   Skin: Negative.   Psychiatric/Behavioral: Negative.      Today's Vitals   07/18/20 1409  BP: 118/62  Pulse: (!) 101  Temp: 97.8 F (36.6 C)  TempSrc: Oral  Weight: 84 lb 3.2 oz (38.2 kg)  Height: 4' 8.6" (1.438 m)  PainSc: 0-No pain   Body mass index is 18.48 kg/m.   Objective:  Physical Exam Vitals  reviewed.  Constitutional:      General: She is not in acute distress.    Appearance: Normal appearance.  Cardiovascular:     Rate and Rhythm: Normal rate and regular rhythm.     Pulses: Normal pulses.     Heart sounds: Normal heart sounds. No murmur heard.   Pulmonary:     Effort: Pulmonary effort is normal. No respiratory distress.     Breath sounds: Normal breath sounds.  Musculoskeletal:     Comments: She has curvature to her spine and bunion to her left great toe with erythema  Neurological:     General: No focal deficit present.     Mental Status: She is alert and oriented to person, place, and time.      Cranial Nerves: No cranial nerve deficit.  Psychiatric:        Mood and Affect: Mood normal.        Behavior: Behavior normal.        Thought Content: Thought content normal.        Judgment: Judgment normal.         Assessment And Plan:     1. Mild intermittent asthma without complication  Chronic, stable  Continue with current medications - budesonide-formoterol (SYMBICORT) 160-4.5 MCG/ACT inhaler; Inhale 2 puffs into the lungs 2 (two) times daily.  Dispense: 10.2 g; Refill: 6 - loratadine (CLARITIN) 10 MG tablet; Take 1 tablet (10 mg total) by mouth at bedtime.  Dispense: 90 tablet; Refill: 1 - albuterol (PROAIR HFA) 108 (90 Base) MCG/ACT inhaler; Inhale 2 puffs into the lungs every 4 (four) hours as needed for wheezing.  Dispense: 18 g; Refill: 3 - montelukast (SINGULAIR) 10 MG tablet; Take 1 tablet (10 mg total) by mouth at bedtime.  Dispense: 90 tablet; Refill: 1  2. Need for influenza vaccination  Influenza vaccine administered  Encouraged to take Tylenol as needed for fever or muscle aches. - Flu Vaccine QUAD 6+ mos PF IM (Fluarix Quad PF)  3. Bunion of left foot  Left great toe with reddened bunion  She is to call back to office with a phone number so we can refer to orthopedics  4. Hyperlipidemia, unspecified hyperlipidemia type  Chronic, stable  Continue with medication, tolerating statin well - rosuvastatin (CRESTOR) 10 MG tablet; Take 1 tablet (10 mg total) by mouth daily at 6 PM.  Dispense: 90 tablet; Refill: 1 - CMP14+EGFR - Lipid panel    Patient was given opportunity to ask questions. Patient verbalized understanding of the plan and was able to repeat key elements of the plan. All questions were answered to their satisfaction.   Teola Bradley, FNP, have reviewed all documentation for this visit. The documentation on 07/18/20 for the exam, diagnosis, procedures, and orders are all accurate and complete.   THE PATIENT IS ENCOURAGED TO PRACTICE SOCIAL  DISTANCING DUE TO THE COVID-19 PANDEMIC.

## 2020-07-18 NOTE — Patient Instructions (Addendum)
Asthma, Adult  Asthma is a long-term (chronic) condition in which the airways get tight and narrow. The airways are the breathing passages that lead from the nose and mouth down into the lungs. A person with asthma will have times when symptoms get worse. These are called asthma attacks. They can cause coughing, whistling sounds when you breathe (wheezing), shortness of breath, and chest pain. They can make it hard to breathe. There is no cure for asthma, but medicines and lifestyle changes can help control it. There are many things that can bring on an asthma attack or make asthma symptoms worse (triggers). Common triggers include:  Mold.  Dust.  Cigarette smoke.  Cockroaches.  Things that can cause allergy symptoms (allergens). These include animal skin flakes (dander) and pollen from trees or grass.  Things that pollute the air. These may include household cleaners, wood smoke, smog, or chemical odors.  Cold air, weather changes, and wind.  Crying or laughing hard.  Stress.  Certain medicines or drugs.  Certain foods such as dried fruit, potato chips, and grape juice.  Infections, such as a cold or the flu.  Certain medical conditions or diseases.  Exercise or tiring activities. Asthma may be treated with medicines and by staying away from the things that cause asthma attacks. Types of medicines may include:  Controller medicines. These help prevent asthma symptoms. They are usually taken every day.  Fast-acting reliever or rescue medicines. These quickly relieve asthma symptoms. They are used as needed and provide short-term relief.  Allergy medicines if your attacks are brought on by allergens.  Medicines to help control the body's defense (immune) system. Follow these instructions at home: Avoiding triggers in your home  Change your heating and air conditioning filter often.  Limit your use of fireplaces and wood stoves.  Get rid of pests (such as roaches and  mice) and their droppings.  Throw away plants if you see mold on them.  Clean your floors. Dust regularly. Use cleaning products that do not smell.  Have someone vacuum when you are not home. Use a vacuum cleaner with a HEPA filter if possible.  Replace carpet with wood, tile, or vinyl flooring. Carpet can trap animal skin flakes and dust.  Use allergy-proof pillows, mattress covers, and box spring covers.  Wash bed sheets and blankets every week in hot water. Dry them in a dryer.  Keep your bedroom free of any triggers.  Avoid pets and keep windows closed when things that cause allergy symptoms are in the air.  Use blankets that are made of polyester or cotton.  Clean bathrooms and kitchens with bleach. If possible, have someone repaint the walls in these rooms with mold-resistant paint. Keep out of the rooms that are being cleaned and painted.  Wash your hands often with soap and water. If soap and water are not available, use hand sanitizer.  Do not allow anyone to smoke in your home. General instructions  Take over-the-counter and prescription medicines only as told by your doctor. ? Talk with your doctor if you have questions about how or when to take your medicines. ? Make note if you need to use your medicines more often than usual.  Do not use any products that contain nicotine or tobacco, such as cigarettes and e-cigarettes. If you need help quitting, ask your doctor.  Stay away from secondhand smoke.  Avoid doing things outdoors when allergen counts are high and when air quality is low.  Wear a ski mask   when doing outdoor activities in the winter. The mask should cover your nose and mouth. Exercise indoors on cold days if you can.  Warm up before you exercise. Take time to cool down after exercise.  Use a peak flow meter as told by your doctor. A peak flow meter is a tool that measures how well the lungs are working.  Keep track of the peak flow meter's readings.  Write them down.  Follow your asthma action plan. This is a written plan for taking care of your asthma and treating your attacks.  Make sure you get all the shots (vaccines) that your doctor recommends. Ask your doctor about a flu shot and a pneumonia shot.  Keep all follow-up visits as told by your doctor. This is important. Contact a doctor if:  You have wheezing, shortness of breath, or a cough even while taking medicine to prevent attacks.  The mucus you cough up (sputum) is thicker than usual.  The mucus you cough up changes from clear or white to yellow, green, gray, or bloody.  You have problems from the medicine you are taking, such as: ? A rash. ? Itching. ? Swelling. ? Trouble breathing.  You need reliever medicines more than 2-3 times a week.  Your peak flow reading is still at 50-79% of your personal best after following the action plan for 1 hour.  You have a fever. Get help right away if:  You seem to be worse and are not responding to medicine during an asthma attack.  You are short of breath even at rest.  You get short of breath when doing very little activity.  You have trouble eating, drinking, or talking.  You have chest pain or tightness.  You have a fast heartbeat.  Your lips or fingernails start to turn blue.  You are light-headed or dizzy, or you faint.  Your peak flow is less than 50% of your personal best.  You feel too tired to breathe normally. Summary  Asthma is a long-term (chronic) condition in which the airways get tight and narrow. An asthma attack can make it hard to breathe.  Asthma cannot be cured, but medicines and lifestyle changes can help control it.  Make sure you understand how to avoid triggers and how and when to use your medicines. This information is not intended to replace advice given to you by your health care provider. Make sure you discuss any questions you have with your health care provider. Document Revised:  11/26/2018 Document Reviewed: 10/28/2016 Elsevier Patient Education  [place].  Influenza Virus Vaccine (Flucelvax) What is this medicine? INFLUENZA VIRUS VACCINE (in floo EN zuh VAHY ruhs vak SEEN) helps to reduce the risk of getting influenza also known as the flu. The vaccine only helps protect you against some strains of the flu. This medicine may be used for other purposes; ask your health care provider or pharmacist if you have questions. COMMON BRAND NAME(S): FLUCELVAX What should I tell my health care provider before I take this medicine? They need to know if you have any of these conditions:  bleeding disorder like hemophilia  fever or infection  Guillain-Barre syndrome or other neurological problems  immune system problems  infection with the human immunodeficiency virus (HIV) or AIDS  low blood platelet counts  multiple sclerosis  an unusual or allergic reaction to influenza virus vaccine, other medicines, foods, dyes or preservatives  pregnant or trying to get pregnant  breast-feeding How should I use this  medicine? This vaccine is for injection into a muscle. It is given by a health care professional. A copy of Vaccine Information Statements will be given before each vaccination. Read this sheet carefully each time. The sheet may change frequently. Talk to your pediatrician regarding the use of this medicine in children. Special care may be needed. Overdosage: If you think you've taken too much of this medicine contact a poison control center or emergency room at once. Overdosage: If you think you have taken too much of this medicine contact a poison control center or emergency room at once. NOTE: This medicine is only for you. Do not share this medicine with others. What if I miss a dose? This does not apply. What may interact with this medicine?  chemotherapy or radiation therapy  medicines that lower your immune system like etanercept, anakinra,  infliximab, and adalimumab  medicines that treat or prevent blood clots like warfarin  phenytoin  steroid medicines like prednisone or cortisone  theophylline  vaccines This list may not describe all possible interactions. Give your health care provider a list of all the medicines, herbs, non-prescription drugs, or dietary supplements you use. Also tell them if you smoke, drink alcohol, or use illegal drugs. Some items may interact with your medicine. What should I watch for while using this medicine? Report any side effects that do not go away within 3 days to your doctor or health care professional. Call your health care provider if any unusual symptoms occur within 6 weeks of receiving this vaccine. You may still catch the flu, but the illness is not usually as bad. You cannot get the flu from the vaccine. The vaccine will not protect against colds or other illnesses that may cause fever. The vaccine is needed every year. What side effects may I notice from receiving this medicine? Side effects that you should report to your doctor or health care professional as soon as possible:  allergic reactions like skin rash, itching or hives, swelling of the face, lips, or tongue Side effects that usually do not require medical attention (Report these to your doctor or health care professional if they continue or are bothersome.):  fever  headache  muscle aches and pains  pain, tenderness, redness, or swelling at the injection site  tiredness This list may not describe all possible side effects. Call your doctor for medical advice about side effects. You may report side effects to FDA at 1-800-FDA-1088. Where should I keep my medicine? The vaccine will be given by a health care professional in a clinic, pharmacy, doctor's office, or other health care setting. You will not be given vaccine doses to store at home. NOTE: This sheet is a summary. It may not cover all possible information. If  you have questions about this medicine, talk to your doctor, pharmacist, or health care provider.  2020 Elsevier/Gold Standard (2011-09-04 14:06:47)  Influenza Virus Vaccine (Flucelvax) What is this medicine? INFLUENZA VIRUS VACCINE (in floo EN zuh VAHY ruhs vak SEEN) helps to reduce the risk of getting influenza also known as the flu. The vaccine only helps protect you against some strains of the flu. This medicine may be used for other purposes; ask your health care provider or pharmacist if you have questions. COMMON BRAND NAME(S): FLUCELVAX What should I tell my health care provider before I take this medicine? They need to know if you have any of these conditions:  bleeding disorder like hemophilia  fever or infection  Guillain-Barre syndrome or other  neurological problems  immune system problems  infection with the human immunodeficiency virus (HIV) or AIDS  low blood platelet counts  multiple sclerosis  an unusual or allergic reaction to influenza virus vaccine, other medicines, foods, dyes or preservatives  pregnant or trying to get pregnant  breast-feeding How should I use this medicine? This vaccine is for injection into a muscle. It is given by a health care professional. A copy of Vaccine Information Statements will be given before each vaccination. Read this sheet carefully each time. The sheet may change frequently. Talk to your pediatrician regarding the use of this medicine in children. Special care may be needed. Overdosage: If you think you've taken too much of this medicine contact a poison control center or emergency room at once. Overdosage: If you think you have taken too much of this medicine contact a poison control center or emergency room at once. NOTE: This medicine is only for you. Do not share this medicine with others. What if I miss a dose? This does not apply. What may interact with this medicine?  chemotherapy or radiation  therapy  medicines that lower your immune system like etanercept, anakinra, infliximab, and adalimumab  medicines that treat or prevent blood clots like warfarin  phenytoin  steroid medicines like prednisone or cortisone  theophylline  vaccines This list may not describe all possible interactions. Give your health care provider a list of all the medicines, herbs, non-prescription drugs, or dietary supplements you use. Also tell them if you smoke, drink alcohol, or use illegal drugs. Some items may interact with your medicine. What should I watch for while using this medicine? Report any side effects that do not go away within 3 days to your doctor or health care professional. Call your health care provider if any unusual symptoms occur within 6 weeks of receiving this vaccine. You may still catch the flu, but the illness is not usually as bad. You cannot get the flu from the vaccine. The vaccine will not protect against colds or other illnesses that may cause fever. The vaccine is needed every year. What side effects may I notice from receiving this medicine? Side effects that you should report to your doctor or health care professional as soon as possible:  allergic reactions like skin rash, itching or hives, swelling of the face, lips, or tongue Side effects that usually do not require medical attention (Report these to your doctor or health care professional if they continue or are bothersome.):  fever  headache  muscle aches and pains  pain, tenderness, redness, or swelling at the injection site  tiredness This list may not describe all possible side effects. Call your doctor for medical advice about side effects. You may report side effects to FDA at 1-800-FDA-1088. Where should I keep my medicine? The vaccine will be given by a health care professional in a clinic, pharmacy, doctor's office, or other health care setting. You will not be given vaccine doses to store at  home. NOTE: This sheet is a summary. It may not cover all possible information. If you have questions about this medicine, talk to your doctor, pharmacist, or health care provider.  2020 Elsevier/Gold Standard (2011-09-04 14:06:47)  Be sure to call us with the phone number for [place] to be able to contact you for your referral.

## 2020-07-19 DIAGNOSIS — G8929 Other chronic pain: Secondary | ICD-10-CM | POA: Diagnosis not present

## 2020-07-19 DIAGNOSIS — Z23 Encounter for immunization: Secondary | ICD-10-CM | POA: Diagnosis not present

## 2020-07-19 DIAGNOSIS — M21612 Bunion of left foot: Secondary | ICD-10-CM | POA: Diagnosis not present

## 2020-07-19 DIAGNOSIS — J4520 Mild intermittent asthma, uncomplicated: Secondary | ICD-10-CM | POA: Diagnosis not present

## 2020-07-19 DIAGNOSIS — E785 Hyperlipidemia, unspecified: Secondary | ICD-10-CM | POA: Diagnosis not present

## 2020-07-19 DIAGNOSIS — M549 Dorsalgia, unspecified: Secondary | ICD-10-CM | POA: Diagnosis not present

## 2020-07-19 LAB — CMP14+EGFR
ALT: 11 IU/L (ref 0–32)
AST: 17 IU/L (ref 0–40)
Albumin/Globulin Ratio: 2.2 (ref 1.2–2.2)
Albumin: 4.8 g/dL (ref 3.8–4.8)
Alkaline Phosphatase: 67 IU/L (ref 44–121)
BUN/Creatinine Ratio: 14 (ref 12–28)
BUN: 10 mg/dL (ref 8–27)
Bilirubin Total: 1 mg/dL (ref 0.0–1.2)
CO2: 24 mmol/L (ref 20–29)
Calcium: 9.2 mg/dL (ref 8.7–10.3)
Chloride: 102 mmol/L (ref 96–106)
Creatinine, Ser: 0.7 mg/dL (ref 0.57–1.00)
GFR calc Af Amer: 108 mL/min/{1.73_m2} (ref >59)
GFR calc non Af Amer: 94 mL/min/{1.73_m2} (ref >59)
Globulin, Total: 2.2 g/dL (ref 1.5–4.5)
Glucose: 89 mg/dL (ref 65–99)
Potassium: 3.4 mmol/L — ABNORMAL LOW (ref 3.5–5.2)
Sodium: 142 mmol/L (ref 134–144)
Total Protein: 7 g/dL (ref 6.0–8.5)

## 2020-07-19 LAB — LIPID PANEL
Chol/HDL Ratio: 3 ratio (ref 0.0–4.4)
Cholesterol, Total: 193 mg/dL (ref 100–199)
HDL: 64 mg/dL (ref >39)
LDL Chol Calc (NIH): 119 mg/dL — ABNORMAL HIGH (ref 0–99)
Triglycerides: 54 mg/dL (ref 0–149)
VLDL Cholesterol Cal: 10 mg/dL (ref 5–40)

## 2020-07-26 ENCOUNTER — Telehealth: Payer: Self-pay

## 2020-07-26 NOTE — Telephone Encounter (Signed)
I called the patient to reschedule her AWV but received a message stating that the number isn't in service.

## 2020-08-18 DIAGNOSIS — J45909 Unspecified asthma, uncomplicated: Secondary | ICD-10-CM | POA: Diagnosis not present

## 2020-09-17 DIAGNOSIS — J45909 Unspecified asthma, uncomplicated: Secondary | ICD-10-CM | POA: Diagnosis not present

## 2020-09-19 ENCOUNTER — Ambulatory Visit: Payer: Self-pay | Admitting: Nurse Practitioner

## 2020-10-18 DIAGNOSIS — J45909 Unspecified asthma, uncomplicated: Secondary | ICD-10-CM | POA: Diagnosis not present

## 2020-11-07 DIAGNOSIS — J45909 Unspecified asthma, uncomplicated: Secondary | ICD-10-CM | POA: Diagnosis not present

## 2020-11-18 DIAGNOSIS — J45909 Unspecified asthma, uncomplicated: Secondary | ICD-10-CM | POA: Diagnosis not present

## 2020-12-05 DIAGNOSIS — J45909 Unspecified asthma, uncomplicated: Secondary | ICD-10-CM | POA: Diagnosis not present

## 2020-12-16 DIAGNOSIS — J45909 Unspecified asthma, uncomplicated: Secondary | ICD-10-CM | POA: Diagnosis not present

## 2021-01-16 DIAGNOSIS — J45909 Unspecified asthma, uncomplicated: Secondary | ICD-10-CM | POA: Diagnosis not present

## 2021-01-17 ENCOUNTER — Other Ambulatory Visit: Payer: Self-pay

## 2021-01-17 ENCOUNTER — Encounter: Payer: Self-pay | Admitting: Nurse Practitioner

## 2021-01-17 ENCOUNTER — Ambulatory Visit (INDEPENDENT_AMBULATORY_CARE_PROVIDER_SITE_OTHER): Payer: Medicare HMO

## 2021-01-17 ENCOUNTER — Ambulatory Visit (INDEPENDENT_AMBULATORY_CARE_PROVIDER_SITE_OTHER): Payer: Medicare HMO | Admitting: Nurse Practitioner

## 2021-01-17 VITALS — BP 102/64 | HR 119 | Temp 98.3°F | Ht <= 58 in | Wt 87.0 lb

## 2021-01-17 DIAGNOSIS — Z1231 Encounter for screening mammogram for malignant neoplasm of breast: Secondary | ICD-10-CM

## 2021-01-17 DIAGNOSIS — Z0000 Encounter for general adult medical examination without abnormal findings: Secondary | ICD-10-CM | POA: Diagnosis not present

## 2021-01-17 DIAGNOSIS — E785 Hyperlipidemia, unspecified: Secondary | ICD-10-CM | POA: Diagnosis not present

## 2021-01-17 DIAGNOSIS — J4520 Mild intermittent asthma, uncomplicated: Secondary | ICD-10-CM

## 2021-01-17 DIAGNOSIS — G8929 Other chronic pain: Secondary | ICD-10-CM | POA: Diagnosis not present

## 2021-01-17 DIAGNOSIS — R059 Cough, unspecified: Secondary | ICD-10-CM | POA: Diagnosis not present

## 2021-01-17 DIAGNOSIS — M549 Dorsalgia, unspecified: Secondary | ICD-10-CM

## 2021-01-17 DIAGNOSIS — I700 Atherosclerosis of aorta: Secondary | ICD-10-CM | POA: Diagnosis not present

## 2021-01-17 DIAGNOSIS — Z1211 Encounter for screening for malignant neoplasm of colon: Secondary | ICD-10-CM

## 2021-01-17 MED ORDER — BUDESONIDE-FORMOTEROL FUMARATE 160-4.5 MCG/ACT IN AERO: 2.0000 | INHALATION_SPRAY | Freq: Two times a day (BID) | RESPIRATORY_TRACT | 6 refills | Status: DC

## 2021-01-17 MED ORDER — MONTELUKAST SODIUM 10 MG PO TABS: 10.0000 mg | ORAL_TABLET | Freq: Every day | ORAL | 1 refills | Status: DC

## 2021-01-17 MED ORDER — ROSUVASTATIN CALCIUM 10 MG PO TABS: 10.0000 mg | ORAL_TABLET | Freq: Every day | ORAL | 1 refills | Status: DC

## 2021-01-17 MED ORDER — PREDNISONE 10 MG (21) PO TBPK: ORAL_TABLET | ORAL | 0 refills | Status: DC

## 2021-01-17 NOTE — Progress Notes (Signed)
I,Yamilka Roman [employer] as a Education administrator for [employer], FNP.,have documented all relevant documentation on the behalf of Minette Brine, FNP,as directed by  Minette Brine, FNP while in the presence of Minette Brine, [place].  This visit occurred during the SARS-CoV-2 public health emergency.  Safety protocols were in place, including screening questions prior to the visit, additional usage of staff PPE, and extensive cleaning of exam room while observing appropriate contact time as indicated for disinfecting solutions.  Subjective:     Patient ID: Stacey Brock , female    DOB: 24-Sep-1961 , 60 y.o.   MRN: [id number]   Chief Complaint  Patient presents with  . Asthma    Patient stated she has been having a cough and wheezing. She has had to use her inhaler more often    HPI  Patient presents today for a f/u on her asthma. She talks about her sister "stressing her out".    Wt Readings from Last 3 Encounters: 01/17/21 : 87 lb (39.5 kg) 07/18/20 : 84 lb 3.2 oz (38.2 kg) 01/17/20 : 90 lb 6.4 oz (41 kg)   Asthma She complains of cough. This is a chronic problem. The current episode started more than 1 year ago (last few weeks cough has worsened). The problem has been unchanged. Her symptoms are alleviated by ipratropium. She reports moderate improvement on treatment. Risk factors: environmental allergies. Her past medical history is significant for asthma.     Past Medical History:  Diagnosis Date  . Asthma   . Sinus trouble      Family History  Problem Relation Age of Onset  . Asthma Sister   . Cancer Father        colon     Current Outpatient Medications:  .  albuterol (PROAIR HFA) 108 (90 Base) MCG/ACT inhaler, Inhale 2 puffs into the lungs every 4 (four) hours as needed for wheezing., Disp: 18 g, Rfl: 3 .  albuterol (PROVENTIL) (2.5 MG/3ML) 0.083% nebulizer solution, Take 3 mLs (2.5 mg total) by nebulization every 6 (six) hours as needed for wheezing or shortness of breath.,  Disp: 75 mL, Rfl: 12 .  Ascorbic Acid (VITAMIN C GUMMIE PO), Take 1 capsule by mouth daily at 6 (six) AM., Disp: , Rfl:  .  loratadine (CLARITIN) 10 MG tablet, Take 1 tablet (10 mg total) by mouth at bedtime., Disp: 90 tablet, Rfl: 1 .  predniSONE (STERAPRED UNI-PAK 21 TAB) 10 MG (21) TBPK tablet, Take as directed, Disp: 21 tablet, Rfl: 0 .  budesonide-formoterol (SYMBICORT) 160-4.5 MCG/ACT inhaler, Inhale 2 puffs into the lungs 2 (two) times daily., Disp: 10.2 g, Rfl: 6 .  montelukast (SINGULAIR) 10 MG tablet, Take 1 tablet (10 mg total) by mouth at bedtime., Disp: 90 tablet, Rfl: 1 .  rosuvastatin (CRESTOR) 10 MG tablet, Take 1 tablet (10 mg total) by mouth daily at 6 PM., Disp: 90 tablet, Rfl: 1   Allergies  Allergen Reactions  . Aspirin Other (See Comments)    Pt cannot specify reaction type and unsure if true allergy, but she states " I almost died after I had Aspirin, my heart stopped on operating table".   . Ibuprofen Shortness Of Breath    Unsure if this is a true allergy. Patient presents with sweating, tachy, dizziness, flushing     Review of Systems  Constitutional: Negative.  Negative for fatigue.  Eyes: Negative.   Respiratory: Positive for cough.   Cardiovascular: Negative.   Musculoskeletal: Positive for back pain.  Skin:  Negative.   Neurological: Negative.   Psychiatric/Behavioral: Negative.      Today's Vitals   01/17/21 1415  BP: 102/64  Pulse: (!) 119  Temp: 98.3 F (36.8 C)  TempSrc: Oral  Weight: 87 lb (39.5 kg)  Height: 4' 8.6" (1.438 m)  PainSc: 0-No pain   Body mass index is 19.09 kg/m.   Objective:  Physical Exam Vitals reviewed.  Constitutional:      General: She is not in acute distress.    Appearance: Normal appearance.  Cardiovascular:     Rate and Rhythm: Normal rate and regular rhythm.     Pulses: Normal pulses.     Heart sounds: Normal heart sounds. No murmur heard.   Pulmonary:     Effort: Pulmonary effort is normal. No  respiratory distress.     Breath sounds: Examination of the right-lower field reveals wheezing. Examination of the left-lower field reveals wheezing. Wheezing present.  Musculoskeletal:     Lumbar back: Deformity present. No spasms or tenderness. Decreased range of motion.  Neurological:     Mental Status: She is alert.         Assessment And Plan:     1. Mild intermittent asthma without complication  Chronic, she is wheezing to bilateral lower lobes  Will treat with steroids  She is to continue with her symbicort and montelukast - montelukast (SINGULAIR) 10 MG tablet; Take 1 tablet (10 mg total) by mouth at bedtime.  Dispense: 90 tablet; Refill: 1 - budesonide-formoterol (SYMBICORT) 160-4.5 MCG/ACT inhaler; Inhale 2 puffs into the lungs 2 (two) times daily.  Dispense: 10.2 g; Refill: 6 - DG Chest 2 View; Future - [facility]B Referral to [facility] - predniSONE (STERAPRED UNI-PAK 21 TAB) 10 MG (21) TBPK tablet; Take as directed  Dispense: 21 tablet; Refill: 0  2. Hyperlipidemia, unspecified hyperlipidemia type  Chronic, controlled  Continue with current medications, tolerating medications well - rosuvastatin (CRESTOR) 10 MG tablet; Take 1 tablet (10 mg total) by mouth daily at 6 PM.  Dispense: 90 tablet; Refill: 1 - Lipid panel - CMP14+EGFR  3. Other chronic back pain  She has deformity to her back with a significant curvature  I will refer her again to orthopedics and have advised her to answer the phone when they call her - Ambulatory referral to Orthopedic Surgery - AMB Referral to [place]  4. Cough  She is to get a chest xray - DG Chest 2 View; Future - AMB Referral to [facility]  5. Atherosclerosis of aorta (HCC)  Continue with rosuvastatin  6. Encounter for screening colonoscopy  According to USPTF Colorectal cancer Screening guidelines. Colonoscopy is recommended every 10 years, starting at age 58 years.  Will  refer to GI for colon cancer screening. - Ambulatory referral to Gastroenterology  7. Screening mammogram, encounter for  Pt instructed on Self Breast Exam.According to ACOG guidelines Women aged 32 and older are recommended to get an annual mammogram. Form completed and given to patient contact the [facility] for appointment scheduing.   Pt encouraged to get annual mammogram - MM Digital Screening; Future     Patient was given opportunity to ask questions. Patient verbalized understanding of the plan and was able to repeat key elements of the plan. All questions were answered to their satisfaction.  Minette Brine, FNP   I, Minette Brine, FNP, have reviewed all documentation for this visit. The documentation on 01/17/21 for the exam, diagnosis, procedures, and orders are all accurate  and complete.   IF YOU HAVE BEEN REFERRED TO A SPECIALIST, IT MAY TAKE 1-2 WEEKS TO SCHEDULE/PROCESS THE REFERRAL. IF YOU HAVE NOT HEARD FROM US/SPECIALIST IN TWO WEEKS, PLEASE GIVE US A CALL AT [phone] X [phone].   THE PATIENT IS ENCOURAGED TO PRACTICE SOCIAL DISTANCING DUE TO THE COVID-19 PANDEMIC.   

## 2021-01-17 NOTE — Patient Instructions (Signed)
Stacey Brock , Thank you for taking time to come for your Medicare Wellness Visit. I appreciate your ongoing commitment to your health goals. Please review the following plan we discussed and let me know if I can assist you in the future.   Screening recommendations/referrals: Colonoscopy: ordered today Mammogram: ordered today Bone Density: n/a Recommended yearly ophthalmology/optometry visit for glaucoma screening and checkup Recommended yearly dental visit for hygiene and checkup  Vaccinations: Influenza vaccine: completed 07/19/2020, due 05/07/2021 Pneumococcal vaccine: n/a Tdap vaccine: due Shingles vaccine: discussed  Covid-19:  11/29/2020, 06/05/2020, 05/13/2020  Advanced directives: Advance directive discussed with you today. Even though you declined this today please call our office should you change your mind and we can give you the proper paperwork for you to fill out.   Conditions/risks identified: none  Next appointment: Follow up in one year for your annual wellness visit.   Preventive Care 40-64 Years, Female Preventive care refers to lifestyle choices and visits with your health care provider that can promote health and wellness. What does preventive care include?  A yearly physical exam. This is also called an annual well check.  Dental exams once or twice a year.  Routine eye exams. Ask your health care provider how often you should have your eyes checked.  Personal lifestyle choices, including:  Daily care of your teeth and gums.  Regular physical activity.  Eating a healthy diet.  Avoiding tobacco and drug use.  Limiting alcohol use.  Practicing safe sex.  Taking low-dose aspirin daily starting at age 45.  Taking vitamin and mineral supplements as recommended by your health care provider. What happens during an annual well check? The services and screenings done by your health care provider during your annual well check will depend on your age, overall  health, lifestyle risk factors, and family history of disease. Counseling  Your health care provider may ask you questions about your:  Alcohol use.  Tobacco use.  Drug use.  Emotional well-being.  Home and relationship well-being.  Sexual activity.  Eating habits.  Work and work Statistician.  Method of birth control.  Menstrual cycle.  Pregnancy history. Screening  You may have the following tests or measurements:  Height, weight, and BMI.  Blood pressure.  Lipid and cholesterol levels. These may be checked every 5 years, or more frequently if you are over 30 years old.  Skin check.  Lung cancer screening. You may have this screening every year starting at age 39 if you have a 30-pack-year history of smoking and currently smoke or have quit within the past 15 years.  Fecal occult blood test (FOBT) of the stool. You may have this test every year starting at age 39.  Flexible sigmoidoscopy or colonoscopy. You may have a sigmoidoscopy every 5 years or a colonoscopy every 10 years starting at age 44.  Hepatitis C blood test.  Hepatitis B blood test.  Sexually transmitted disease (STD) testing.  Diabetes screening. This is done by checking your blood sugar (glucose) after you have not eaten for a while (fasting). You may have this done every 1-3 years.  Mammogram. This may be done every 1-2 years. Talk to your health care provider about when you should start having regular mammograms. This may depend on whether you have a family history of breast cancer.  BRCA-related cancer screening. This may be done if you have a family history of breast, ovarian, tubal, or peritoneal cancers.  Pelvic exam and Pap test. This may be done every  3 years starting at age 40. Starting at age 55, this may be done every 5 years if you have a Pap test in combination with an HPV test.  Bone density scan. This is done to screen for osteoporosis. You may have this scan if you are at high  risk for osteoporosis. Discuss your test results, treatment options, and if necessary, the need for more tests with your health care provider. Vaccines  Your health care provider may recommend certain vaccines, such as:  Influenza vaccine. This is recommended every year.  Tetanus, diphtheria, and acellular pertussis (Tdap, Td) vaccine. You may need a Td booster every 10 years.  Zoster vaccine. You may need this after age 37.  Pneumococcal 13-valent conjugate (PCV13) vaccine. You may need this if you have certain conditions and were not previously vaccinated.  Pneumococcal polysaccharide (PPSV23) vaccine. You may need one or two doses if you smoke cigarettes or if you have certain conditions. Talk to your health care provider about which screenings and vaccines you need and how often you need them. This information is not intended to replace advice given to you by your health care provider. Make sure you discuss any questions you have with your health care provider. Document Released: 10/20/2015 Document Revised: 06/12/2016 Document Reviewed: 07/25/2015 Elsevier Interactive Patient Education  2017 [employer] Prevention in the Home Falls can cause injuries. They can happen to people of all ages. There are many things you can do to make your home safe and to help prevent falls. What can I do on the outside of my home?  Regularly fix the edges of walkways and driveways and fix any cracks.  Remove anything that might make you trip as you walk through a door, such as a raised step or threshold.  Trim any bushes or trees on the path to your home.  Use bright outdoor lighting.  Clear any walking paths of anything that might make someone trip, such as rocks or tools.  Regularly check to see if handrails are loose or broken. Make sure that both sides of any steps have handrails.  Any raised decks and porches should have guardrails on the edges.  Have any leaves, snow, or ice  cleared regularly.  Use sand or salt on walking paths during winter.  Clean up any spills in your garage right away. This includes oil or grease spills. What can I do in the bathroom?  Use night lights.  Install grab bars by the toilet and in the tub and shower. Do not use towel bars as grab bars.  Use non-skid mats or decals in the tub or shower.  If you need to sit down in the shower, use a plastic, non-slip stool.  Keep the floor dry. Clean up any water that spills on the floor as soon as it happens.  Remove soap buildup in the tub or shower regularly.  Attach bath mats securely with double-sided non-slip rug tape.  Do not have throw rugs and other things on the floor that can make you trip. What can I do in the bedroom?  Use night lights.  Make sure that you have a light by your bed that is easy to reach.  Do not use any sheets or blankets that are too big for your bed. They should not hang down onto the floor.  Have a firm chair that has side arms. You can use this for support while you get dressed.  Do not have throw  rugs and other things on the floor that can make you trip. What can I do in the kitchen?  Clean up any spills right away.  Avoid walking on wet floors.  Keep items that you use a lot in easy-to-reach places.  If you need to reach something above you, use a strong step stool that has a grab bar.  Keep electrical cords out of the way.  Do not use floor polish or wax that makes floors slippery. If you must use wax, use non-skid floor wax.  Do not have throw rugs and other things on the floor that can make you trip. What can I do with my stairs?  Do not leave any items on the stairs.  Make sure that there are handrails on both sides of the stairs and use them. Fix handrails that are broken or loose. Make sure that handrails are as long as the stairways.  Check any carpeting to make sure that it is firmly attached to the stairs. Fix any carpet that  is loose or worn.  Avoid having throw rugs at the top or bottom of the stairs. If you do have throw rugs, attach them to the floor with carpet tape.  Make sure that you have a light switch at the top of the stairs and the bottom of the stairs. If you do not have them, ask someone to add them for you. What else can I do to help prevent falls?  Wear shoes that:  Do not have high heels.  Have rubber bottoms.  Are comfortable and fit you well.  Are closed at the toe. Do not wear sandals.  If you use a stepladder:  Make sure that it is fully opened. Do not climb a closed stepladder.  Make sure that both sides of the stepladder are locked into place.  Ask someone to hold it for you, if possible.  Clearly mark and make sure that you can see:  Any grab bars or handrails.  First and last steps.  Where the edge of each step is.  Use tools that help you move around (mobility aids) if they are needed. These include:  Canes.  Walkers.  Scooters.  Crutches.  Turn on the lights when you go into a dark area. Replace any light bulbs as soon as they burn out.  Set up your furniture so you have a clear path. Avoid moving your furniture around.  If any of your floors are uneven, fix them.  If there are any pets around you, be aware of where they are.  Review your medicines with your doctor. Some medicines can make you feel dizzy. This can increase your chance of falling. Ask your doctor what other things that you can do to help prevent falls. This information is not intended to replace advice given to you by your health care provider. Make sure you discuss any questions you have with your health care provider. Document Released: 07/20/2009 Document Revised: 02/29/2016 Document Reviewed: 10/28/2014 Elsevier Interactive Patient Education  2017 [employer].

## 2021-01-17 NOTE — Patient Instructions (Signed)
http://www.aaaai.org/conditions-and-treatments/asthma">  Asthma, Adult  Asthma is a long-term (chronic) condition that causes recurrent episodes in which the airways become tight and narrow. The airways are the passages that lead from the nose and mouth down into the lungs. Asthma episodes, also called asthma attacks, can cause coughing, wheezing, shortness of breath, and chest pain. The airways can also fill with mucus. During an attack, it can be difficult to breathe. Asthma attacks can range from minor to life threatening. Asthma cannot be cured, but medicines and lifestyle changes can help control it and treat acute attacks. What are the causes? This condition is believed to be caused by inherited (genetic) and environmental factors, but its exact cause is not known. There are many things that can bring on an asthma attack or make asthma symptoms worse (triggers). Asthma triggers are different for each person. Common triggers include:  Mold.  Dust.  Cigarette smoke.  Cockroaches.  Things that can cause allergy symptoms (allergens), such as animal dander or pollen from trees or grass.  Air pollutants such as household cleaners, wood smoke, smog, or chemical odors.  Cold air, weather changes, and winds (which increase molds and pollen in the air).  Strong emotional expressions such as crying or laughing hard.  Stress.  Certain medicines (such as aspirin) or types of medicines (such as beta-blockers).  Sulfites in foods and drinks. Foods and drinks that may contain sulfites include dried fruit, potato chips, and sparkling grape juice.  Infections or inflammatory conditions such as the flu, a cold, or inflammation of the nasal membranes (rhinitis).  Gastroesophageal reflux disease (GERD).  Exercise or strenuous activity. What are the signs or symptoms? Symptoms of this condition may occur right after asthma is triggered or many hours later. Symptoms include:  Wheezing. This can  sound like whistling when you breathe.  Excessive nighttime or early morning coughing.  Frequent or severe coughing with a common cold.  Chest tightness.  Shortness of breath.  Tiredness (fatigue) with minimal activity. How is this diagnosed? This condition is diagnosed based on:  Your medical history.  A physical exam.  Tests, which may include: ? Lung function studies and pulmonary studies (spirometry). These tests can evaluate the flow of air in your lungs. ? Allergy tests. ? Imaging tests, such as X-rays. How is this treated? There is no cure for this condition, but treatment can help control your symptoms. Treatment for asthma usually involves:  Identifying and avoiding your asthma triggers.  Using medicines to control your symptoms. Generally, two types of medicines are used to treat asthma: ? Controller medicines. These help prevent asthma symptoms from occurring. They are usually taken every day. ? Fast-acting reliever or rescue medicines. These quickly relieve asthma symptoms by widening the narrow and tight airways. They are used as needed and provide short-term relief.  Using supplemental oxygen. This may be needed during a severe episode.  Using other medicines, such as: ? Allergy medicines, such as antihistamines, if your asthma attacks are triggered by allergens. ? Immune medicines (immunomodulators). These are medicines that help control the immune system.  Creating an asthma action plan. An asthma action plan is a written plan for managing and treating your asthma attacks. This plan includes: ? A list of your asthma triggers and how to avoid them. ? Information about when medicines should be taken and when their dosage should be changed. ? Instructions about using a device called a peak flow meter. A peak flow meter measures how well the lungs are working   and the severity of your asthma. It helps you monitor your condition. Follow these instructions at  home: Controlling your home environment Control your home environment in the following ways to help avoid triggers and prevent asthma attacks:  Change your heating and air conditioning filter regularly.  Limit your use of fireplaces and wood stoves.  Get rid of pests (such as roaches and mice) and their droppings.  Throw away plants if you see mold on them.  Clean floors and dust surfaces regularly. Use unscented cleaning products.  Try to have someone else vacuum for you regularly. Stay out of rooms while they are being vacuumed and for a short while afterward. If you vacuum, use a dust mask from a hardware store, a double-layered or microfilter vacuum cleaner bag, or a vacuum cleaner with a HEPA filter.  Replace carpet with wood, tile, or vinyl flooring. Carpet can trap dander and dust.  Use allergy-proof pillows, mattress covers, and box spring covers.  Keep your bedroom a trigger-free room.  Avoid pets and keep windows closed when allergens are in the air.  Wash beddings every week in hot water and dry them in a dryer.  Use blankets that are made of polyester or cotton.  Clean bathrooms and kitchens with bleach. If possible, have someone repaint the walls in these rooms with mold-resistant paint. Stay out of the rooms that are being cleaned and painted.  Wash your hands often with soap and water. If soap and water are not available, use hand sanitizer.  Do not allow anyone to smoke in your home. General instructions  Take over-the-counter and prescription medicines only as told by your health care provider. ? Speak with your health care provider if you have questions about how or when to take the medicines. ? Make note if you are requiring more frequent dosages.  Do not use any products that contain nicotine or tobacco, such as cigarettes and e-cigarettes. If you need help quitting, ask your health care provider. Also, avoid being exposed to secondhand smoke.  Use a peak  flow meter as told by your health care provider. Record and keep track of the readings.  Understand and use the asthma action plan to help minimize, or stop an asthma attack, without needing to seek medical care.  Make sure you stay up to date on your yearly vaccinations as told by your health care provider. This may include vaccines for the flu and pneumonia.  Avoid outdoor activities when allergen counts are high and when air quality is low.  Wear a ski mask that covers your nose and mouth during outdoor winter activities. Exercise indoors on cold days if you can.  Warm up before exercising, and take time for a cool-down period after exercise.  Keep all follow-up visits as told by your health care provider. This is important. Where to find more information  For information about asthma, turn to the Centers for Disease Control and Prevention at http://www.clark.net/  For air quality information, turn to [employer] at https://www.miller-reyes.info/ Contact a health care provider if:  You have wheezing, shortness of breath, or a cough even while you are taking medicine to prevent attacks.  The mucus you cough up (sputum) is thicker than usual.  Your sputum changes from clear or white to yellow, green, gray, or bloody.  Your medicines are causing side effects, such as a rash, itching, swelling, or trouble breathing.  You need to use a reliever medicine more than 2-3 times a week.  Your peak  flow reading is still at 50-79% of your personal best after following your action plan for 1 hour.  You have a fever. Get help right away if:  You are getting worse and do not respond to treatment during an asthma attack.  You are short of breath when at rest or when doing very little physical activity.  You have difficulty eating, drinking, or talking.  You have chest pain or tightness.  You develop a fast heartbeat or palpitations.  You have a bluish color to your lips or fingernails.  You are  light-headed or dizzy, or you faint.  Your peak flow reading is less than 50% of your personal best.  You feel too tired to breathe normally. Summary  Asthma is a long-term (chronic) condition that causes recurrent episodes in which the airways become tight and narrow. These episodes can cause coughing, wheezing, shortness of breath, and chest pain.  Asthma cannot be cured, but medicines and lifestyle changes can help control it and treat acute attacks.  Make sure you understand how to avoid triggers and how and when to use your medicines.  Asthma attacks can range from minor to life threatening. Get help right away if you have an asthma attack and do not respond to treatment with your usual rescue medicines. This information is not intended to replace advice given to you by your health care provider. Make sure you discuss any questions you have with your health care provider. Document Revised: 06/23/2020 Document Reviewed: 01/26/2020 Elsevier Patient Education  2021 [employer].
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
## 2021-01-18 LAB — LIPID PANEL
Chol/HDL Ratio: 2.6 ratio (ref 0.0–4.4)
Cholesterol, Total: 204 mg/dL — ABNORMAL HIGH (ref 100–199)
HDL: 79 mg/dL (ref >39)
LDL Chol Calc (NIH): 108 mg/dL — ABNORMAL HIGH (ref 0–99)
Triglycerides: 97 mg/dL (ref 0–149)
VLDL Cholesterol Cal: 17 mg/dL (ref 5–40)

## 2021-01-18 LAB — CMP14+EGFR
ALT: 14 IU/L (ref 0–32)
AST: 23 IU/L (ref 0–40)
Albumin/Globulin Ratio: 2.1 (ref 1.2–2.2)
Albumin: 4.9 g/dL — ABNORMAL HIGH (ref 3.8–4.8)
Alkaline Phosphatase: 66 IU/L (ref 44–121)
BUN/Creatinine Ratio: 19 (ref 12–28)
BUN: 13 mg/dL (ref 8–27)
Bilirubin Total: 0.9 mg/dL (ref 0.0–1.2)
CO2: 24 mmol/L (ref 20–29)
Calcium: 9.5 mg/dL (ref 8.7–10.3)
Chloride: 101 mmol/L (ref 96–106)
Creatinine, Ser: 0.68 mg/dL (ref 0.57–1.00)
Globulin, Total: 2.3 g/dL (ref 1.5–4.5)
Glucose: 97 mg/dL (ref 65–99)
Potassium: 4.3 mmol/L (ref 3.5–5.2)
Sodium: 143 mmol/L (ref 134–144)
Total Protein: 7.2 g/dL (ref 6.0–8.5)
eGFR: 99 mL/min/{1.73_m2} (ref >59)

## 2021-01-23 ENCOUNTER — Telehealth: Payer: Self-pay | Admitting: *Deleted

## 2021-01-23 NOTE — Chronic Care Management (AMB) (Signed)
  Chronic Care Management   Outreach Note  01/23/2021 Name: LATIFFANY HARWICK MRN: [id number] DOB: [birth year]  Georgia Dom Munns is a 60 y.o. year old female who is a primary care patient of Minette Brine, [place]. I reached out to Ellison Hughs by phone today in response to a referral sent by Ms. Adrean L Olsen's PCP, Minette Brine, FNP.     An unsuccessful telephone outreach was attempted today. The patient was referred to the case management team for assistance with care management and care coordination.   Follow Up Plan: A HIPAA compliant phone message was left for the patient providing contact information and requesting a return call. The care management team will reach out to the patient again over the next 7 days. If patient returns call to provider office, please advise to call [place] at [phone].  [place] Management

## 2021-01-25 ENCOUNTER — Telehealth: Payer: Self-pay

## 2021-01-25 NOTE — Telephone Encounter (Signed)
-----   Message from Minette Brine, [place] sent at 01/25/2021  1:36 PM EDT ----- Cholesterol levels are back up a little bit, limit your intake of fried and fatty foods. Kidney and liver functions are normal.

## 2021-01-25 NOTE — Telephone Encounter (Signed)
Left the patient a message to call back for lab results. 

## 2021-01-29 NOTE — Chronic Care Management (AMB) (Signed)
  Chronic Care Management   Outreach Note  01/29/2021 Name: ANNELISE MCCOY MRN: [id number] DOB: 1961/06/24  Georgia Dom Merriwether is a 60 y.o. year old female who is a primary care patient of Minette Brine, [place]. I reached out to Ellison Hughs by phone today in response to a referral sent by Ms. Karenann L Fickle's PCP, Minette Brine, FNP.     A second unsuccessful telephone outreach was attempted today. The patient was referred to the case management team for assistance with care management and care coordination.   Follow Up Plan: A HIPAA compliant phone message was left for the patient providing contact information and requesting a return call. The care management team will reach out to the patient again over the next 7 days. If patient returns call to provider office, please advise to call [place] at [phone].  [place] Management

## 2021-02-05 ENCOUNTER — Telehealth: Payer: Self-pay | Admitting: *Deleted

## 2021-02-05 NOTE — Chronic Care Management (AMB) (Signed)
  Chronic Care Management   Outreach Note  02/05/2021 Name: Stacey Brock MRN: [id number] DOB: 11-27-1960  Stacey Brock is a 60 y.o. year old female who is a primary care patient of Minette Brine, [place]. I reached out to Ellison Hughs by phone today in response to a referral sent by Ms. Nathania L Quiett's PCP, Minette Brine, FNP.   Third unsuccessful telephone outreach was attempted today. The patient was referred to the case management team for assistance with care management and care coordination. The patient's primary care provider has been notified of our unsuccessful attempts to make or maintain contact with the patient. The care management team is pleased to engage with this patient at any time in the future should he/she be interested in assistance from the care management team.   Follow Up Plan: We have been unable to make contact with the patient. The care management team is available to follow up with the patient after provider conversation with the patient regarding recommendation for care management engagement and subsequent re-referral to the care management team. A HIPAA compliant phone message was left for the patient providing contact information and requesting a return call.   [place] Management  Direct Dial: [phone]

## 2021-02-06 ENCOUNTER — Ambulatory Visit: Payer: Medicare HMO | Admitting: Orthopaedic Surgery

## 2021-02-15 DIAGNOSIS — J45909 Unspecified asthma, uncomplicated: Secondary | ICD-10-CM | POA: Diagnosis not present

## 2021-03-18 DIAGNOSIS — J45909 Unspecified asthma, uncomplicated: Secondary | ICD-10-CM | POA: Diagnosis not present

## 2021-04-17 DIAGNOSIS — J45909 Unspecified asthma, uncomplicated: Secondary | ICD-10-CM | POA: Diagnosis not present

## 2021-05-18 DIAGNOSIS — J45909 Unspecified asthma, uncomplicated: Secondary | ICD-10-CM | POA: Diagnosis not present

## 2021-06-18 DIAGNOSIS — J45909 Unspecified asthma, uncomplicated: Secondary | ICD-10-CM | POA: Diagnosis not present

## 2021-07-18 DIAGNOSIS — J45909 Unspecified asthma, uncomplicated: Secondary | ICD-10-CM | POA: Diagnosis not present

## 2021-07-19 ENCOUNTER — Other Ambulatory Visit: Payer: Self-pay | Admitting: Nurse Practitioner

## 2021-07-19 DIAGNOSIS — J4520 Mild intermittent asthma, uncomplicated: Secondary | ICD-10-CM

## 2021-07-23 ENCOUNTER — Ambulatory Visit (INDEPENDENT_AMBULATORY_CARE_PROVIDER_SITE_OTHER): Payer: Medicare HMO | Admitting: Nurse Practitioner

## 2021-07-23 ENCOUNTER — Other Ambulatory Visit: Payer: Self-pay

## 2021-07-23 ENCOUNTER — Other Ambulatory Visit (HOSPITAL_COMMUNITY)
Admission: RE | Admit: 2021-07-23 | Discharge: 2021-07-23 | Disposition: A | Discharge status: Home / Self Care | Payer: Medicare HMO | Source: Ambulatory Visit | Attending: Nurse Practitioner | Admitting: Nurse Practitioner

## 2021-07-23 ENCOUNTER — Encounter: Payer: Self-pay | Admitting: Nurse Practitioner

## 2021-07-23 VITALS — BP 118/60 | HR 110 | Temp 98.1°F | Ht <= 58 in | Wt 86.4 lb

## 2021-07-23 DIAGNOSIS — D229 Melanocytic nevi, unspecified: Secondary | ICD-10-CM | POA: Diagnosis not present

## 2021-07-23 DIAGNOSIS — I700 Atherosclerosis of aorta: Secondary | ICD-10-CM | POA: Diagnosis not present

## 2021-07-23 DIAGNOSIS — J4520 Mild intermittent asthma, uncomplicated: Secondary | ICD-10-CM

## 2021-07-23 DIAGNOSIS — K921 Melena: Secondary | ICD-10-CM | POA: Diagnosis not present

## 2021-07-23 DIAGNOSIS — Z0000 Encounter for general adult medical examination without abnormal findings: Secondary | ICD-10-CM | POA: Diagnosis not present

## 2021-07-23 DIAGNOSIS — Z23 Encounter for immunization: Secondary | ICD-10-CM

## 2021-07-23 DIAGNOSIS — Z01419 Encounter for gynecological examination (general) (routine) without abnormal findings: Secondary | ICD-10-CM

## 2021-07-23 DIAGNOSIS — R21 Rash and other nonspecific skin eruption: Secondary | ICD-10-CM | POA: Diagnosis not present

## 2021-07-23 DIAGNOSIS — E785 Hyperlipidemia, unspecified: Secondary | ICD-10-CM | POA: Diagnosis not present

## 2021-07-23 DIAGNOSIS — Z7722 Contact with and (suspected) exposure to environmental tobacco smoke (acute) (chronic): Secondary | ICD-10-CM

## 2021-07-23 DIAGNOSIS — Z79899 Other long term (current) drug therapy: Secondary | ICD-10-CM | POA: Diagnosis not present

## 2021-07-23 MED ORDER — LORATADINE 10 MG PO TABS: 10.0000 mg | ORAL_TABLET | Freq: Every day | ORAL | 1 refills | Status: DC

## 2021-07-23 MED ORDER — ZOSTER VAC RECOMB ADJUVANTED 50 MCG/0.5ML IM SUSR: 0.5000 mL | Freq: Once | INTRAMUSCULAR | 1 refills | Status: AC

## 2021-07-23 MED ORDER — ROSUVASTATIN CALCIUM 10 MG PO TABS: 10.0000 mg | ORAL_TABLET | Freq: Every day | ORAL | 1 refills | Status: DC

## 2021-07-23 MED ORDER — MONTELUKAST SODIUM 10 MG PO TABS: 10.0000 mg | ORAL_TABLET | Freq: Every day | ORAL | 1 refills | Status: DC

## 2021-07-23 MED ORDER — HYDROCORTISONE 1 % EX CREA: TOPICAL_CREAM | CUTANEOUS | 1 refills | Status: DC

## 2021-07-23 MED ORDER — SYMBICORT 160-4.5 MCG/ACT IN AERO: 2.0000 | INHALATION_SPRAY | Freq: Two times a day (BID) | RESPIRATORY_TRACT | 6 refills | Status: DC

## 2021-07-23 NOTE — Progress Notes (Signed)
I,Tianna Badgett,acting as a Education administrator for [employer], FNP.,have documented all relevant documentation on the behalf of Minette Brine, FNP,as directed by  Minette Brine, FNP while in the presence of Minette Brine, [place].  This visit occurred during the SARS-CoV-2 public health emergency.  Safety protocols were in place, including screening questions prior to the visit, additional usage of staff PPE, and extensive cleaning of exam room while observing appropriate contact time as indicated for disinfecting solutions.  Subjective:     Patient ID: Stacey Brock , female    DOB: [birth year] , 60 y.o.   MRN: [id number]   Chief Complaint  Patient presents with   Annual Exam    HPI  Here for HM.     Past Medical History:  Diagnosis Date   Asthma    Sinus trouble      Family History  Problem Relation Age of Onset   Asthma Sister    Cancer Father        colon     Current Outpatient Medications:    albuterol (PROVENTIL) (2.5 MG/3ML) 0.083% nebulizer solution, Take 3 mLs (2.5 mg total) by nebulization every 6 (six) hours as needed for wheezing or shortness of breath., Disp: 75 mL, Rfl: 12   albuterol (VENTOLIN HFA) 108 (90 Base) MCG/ACT inhaler, INHALE 2 PUFFS INTO THE LUNGS EVERY 4 HOURS AS NEEDED FOR WHEEZING, Disp: 18 g, Rfl: 3   Ascorbic Acid (VITAMIN C GUMMIE PO), Take 1 capsule by mouth daily at 6 (six) AM., Disp: , Rfl:    hydrocortisone cream 1 %, Apply to affected area 2 times daily, Disp: 30 g, Rfl: 1   loratadine (CLARITIN) 10 MG tablet, Take 1 tablet (10 mg total) by mouth at bedtime., Disp: 90 tablet, Rfl: 1   montelukast (SINGULAIR) 10 MG tablet, Take 1 tablet (10 mg total) by mouth at bedtime., Disp: 90 tablet, Rfl: 1   rosuvastatin (CRESTOR) 10 MG tablet, Take 1 tablet (10 mg total) by mouth daily at 6 PM., Disp: 90 tablet, Rfl: 1   SYMBICORT 160-4.5 MCG/ACT inhaler, Inhale 2 puffs into the lungs 2 (two) times daily., Disp: 10.2 g, Rfl: 6   Allergies  Allergen Reactions    Aspirin Other (See Comments)    Pt cannot specify reaction type and unsure if true allergy, but she states " I almost died after I had Aspirin, my heart stopped on operating table".    Ibuprofen Shortness Of Breath    Unsure if this is a true allergy. Patient presents with sweating, tachy, dizziness, flushing      The patient states she is post menopausal status.   No LMP recorded. Patient is postmenopausal.. Negative for Dysmenorrhea and Negative for Menorrhagia. Negative for: breast discharge, breast lump(s), breast pain and breast self exam. Associated symptoms include abnormal vaginal bleeding. Pertinent negatives include abnormal bleeding (hematology), anxiety, decreased libido, depression, difficulty falling sleep, dyspareunia, history of infertility, nocturia, sexual dysfunction, sleep disturbances, urinary incontinence, urinary urgency, vaginal discharge and vaginal itching. Diet regular.The patient states her exercise level is moderate with walking 20-30 minutes every day.   The patient's tobacco use is:  Social History   Tobacco Use  Smoking Status Never  Smokeless Tobacco Never   She has been exposed to passive smoke. The patient's alcohol use is:  Social History   Substance and Sexual Activity  Alcohol Use No   Additional information: Last pap unknown, next one scheduled for today.    Review of Systems  Constitutional: Negative.  HENT: Negative.    Eyes: Negative.   Respiratory: Negative.    Cardiovascular: Negative.   Gastrointestinal: Negative.   Endocrine: Negative.   Genitourinary: Negative.   Musculoskeletal: Negative.  Negative for back pain.  Skin:  Negative for rash.  Allergic/Immunologic: Negative.   Neurological: Negative.   Hematological: Negative.   Psychiatric/Behavioral: Negative.      Today's Vitals   07/23/21 1523  BP: 118/60  Pulse: (!) 110  Temp: 98.1 F (36.7 C)  TempSrc: Oral  Weight: 86 lb 6.4 oz (39.2 kg)  Height: 4' 9.2" (1.453 m)    Body mass index is 18.57 kg/m.  Wt Readings from Last 3 Encounters:  07/23/21 86 lb 6.4 oz (39.2 kg)  01/17/21 87 lb (39.5 kg)  01/17/21 87 lb (39.5 kg)    Objective:  Physical Exam Vitals reviewed.  Constitutional:      General: She is not in acute distress.    Appearance: Normal appearance. She is well-developed. She is obese.  HENT:     Head: Normocephalic and atraumatic.     Right Ear: Hearing, tympanic membrane, ear canal and external ear normal. There is no impacted cerumen.     Left Ear: Hearing, tympanic membrane, ear canal and external ear normal. There is no impacted cerumen.     Nose:     Comments: Deferred - masked    Mouth/Throat:     Comments: Deferred - masked Eyes:     General: Lids are normal.     Extraocular Movements: Extraocular movements intact.     Conjunctiva/sclera: Conjunctivae normal.     Pupils: Pupils are equal, round, and reactive to light.     Funduscopic exam:    Right eye: No papilledema.        Left eye: No papilledema.  Neck:     Thyroid: No thyroid mass.     Vascular: No carotid bruit.  Cardiovascular:     Rate and Rhythm: Normal rate and regular rhythm.     Pulses: Normal pulses.     Heart sounds: Normal heart sounds. No murmur heard. Pulmonary:     Effort: Pulmonary effort is normal. No respiratory distress.     Breath sounds: Normal breath sounds. No wheezing.     Comments: She is doing better this visit with her breathing Chest:     Chest wall: No mass.  Breasts:    Tanner Score is 5.     Right: Normal. No mass or tenderness.     Left: Normal. No mass or tenderness.  Abdominal:     General: Abdomen is flat. Bowel sounds are normal. There is no distension.     Palpations: Abdomen is soft.     Tenderness: There is no abdominal tenderness.  Genitourinary:    Rectum: Guaiac result positive (positive).     Comments: Unable to do full pap with specimen from cervix obtained from vaginal wall due to patient extremely  uncomfortable.  Musculoskeletal:        General: No swelling or tenderness. Normal range of motion.     Cervical back: Full passive range of motion without pain, normal range of motion and neck supple.     Right lower leg: No edema.     Left lower leg: No edema.     Comments: She has a curvature to her spine  Lymphadenopathy:     Upper Body:     Right upper body: No supraclavicular, axillary or pectoral adenopathy.     Left upper body:  No supraclavicular, axillary or pectoral adenopathy.  Skin:    General: Skin is warm and dry.     Capillary Refill: Capillary refill takes less than 2 seconds.  Neurological:     General: No focal deficit present.     Mental Status: She is alert and oriented to person, place, and time.     Cranial Nerves: No cranial nerve deficit.     Sensory: No sensory deficit.     Motor: No weakness.  Psychiatric:        Mood and Affect: Mood normal.        Behavior: Behavior normal.        Thought Content: Thought content normal.        Judgment: Judgment normal.        Assessment And Plan:     1. Health maintenance examination Behavior modifications discussed and diet history reviewed.   Pt will continue to exercise regularly and modify diet with low GI, plant based foods and decrease intake of processed foods.  Recommend intake of daily multivitamin, Vitamin D, and calcium.  Recommend mammogram and colonoscopy for preventive screenings, as well as recommend immunizations that include influenza, TDAP, and Shingles  2. Encounter for gynecological examination Comments: PAP done but was difficult due to patient unable to relax effectively, sample obtained from vaginal walls - Cytology - PAP  3. Hyperlipidemia, unspecified hyperlipidemia type Comments: Stable, tolerating medications well.  - Lipid panel - rosuvastatin (CRESTOR) 10 MG tablet; Take 1 tablet (10 mg total) by mouth daily at 6 PM.  Dispense: 90 tablet; Refill: 1  4. Atherosclerosis of aorta  ([place]) Comments: Tolerating statin well - CMP14+EGFR  5. Need for influenza vaccination - Flu Vaccine QUAD 6+ mos PF IM (Fluarix Quad PF) - Zoster Vaccine Adjuvanted [facility]) injection; Inject 0.5 mLs into the muscle once for 1 dose. Administer 2nd dose in 2-6 months  Please fax when each dose administered [phone]  Dispense: 0.5 mL; Refill: 1  6. Other long term (current) drug therapy - CBC  7. Mild intermittent asthma without complication Comments: Stable, no current issues or exacerbation - loratadine (CLARITIN) 10 MG tablet; Take 1 tablet (10 mg total) by mouth at bedtime.  Dispense: 90 tablet; Refill: 1 - SYMBICORT 160-4.5 MCG/ACT inhaler; Inhale 2 puffs into the lungs 2 (two) times daily.  Dispense: 10.2 g; Refill: 6 - montelukast (SINGULAIR) 10 MG tablet; Take 1 tablet (10 mg total) by mouth at bedtime.  Dispense: 90 tablet; Refill: 1  8. Rash and nonspecific skin eruption Comments: she has papules present to arms and chest area, she is to use steroid cream Dermatitis vs insect bites - hydrocortisone cream 1 %; Apply to affected area 2 times daily  Dispense: 30 g; Refill: 1  9. Atypical mole Comments: itching mole to upper left chest she is to find out which dermatologist she would like to use. I will go ahead and place a referral to Dermatology as well as she has challenges with communication with a phone. She may need to have a letter sent.  10. Blood in stool She is positive for hemocult positive blood, I have advised her to go by Dr. Lorie Apley office to schedule an appt since she is in the area.     Patient was given opportunity to ask questions. Patient verbalized understanding of the plan and was able to repeat key elements of the plan. All questions were answered to their satisfaction.   Minette Brine, FNP   I, Minette Brine,  FNP, have reviewed all documentation for this visit. The documentation on 08/06/21 for the exam, diagnosis, procedures, and orders are all  accurate and complete.  THE PATIENT IS ENCOURAGED TO PRACTICE SOCIAL DISTANCING DUE TO THE COVID-19 PANDEMIC.

## 2021-07-23 NOTE — Patient Instructions (Addendum)
Health Maintenance, Female Adopting a healthy lifestyle and getting preventive care are important in promoting health and wellness. Ask your health care provider about: The right schedule for you to have regular tests and exams. Things you can do on your own to prevent diseases and keep yourself healthy. What should I know about diet, weight, and exercise? Eat a healthy diet  Eat a diet that includes plenty of vegetables, fruits, low-fat dairy products, and lean protein. Do not eat a lot of foods that are high in solid fats, added sugars, or sodium. Maintain a healthy weight Body mass index (BMI) is used to identify weight problems. It estimates body fat based on height and weight. Your health care provider can help determine your BMI and help you achieve or maintain a healthy weight. Get regular exercise Get regular exercise. This is one of the most important things you can do for your health. Most adults should: Exercise for at least 150 minutes each week. The exercise should increase your heart rate and make you sweat (moderate-intensity exercise). Do strengthening exercises at least twice a week. This is in addition to the moderate-intensity exercise. Spend less time sitting. Even light physical activity can be beneficial. Watch cholesterol and blood lipids Have your blood tested for lipids and cholesterol at 62 years of age, then have this test every 5 years. Have your cholesterol levels checked more often if: Your lipid or cholesterol levels are high. You are older than 62 years of age. You are at high risk for heart disease. What should I know about cancer screening? Depending on your health history and family history, you may need to have cancer screening at various ages. This may include screening for: Breast cancer. Cervical cancer. Colorectal cancer. Skin cancer. Lung cancer. What should I know about heart disease, diabetes, and high blood pressure? Blood pressure and heart  disease High blood pressure causes heart disease and increases the risk of stroke. This is more likely to develop in people who have high blood pressure readings, are of African descent, or are overweight. Have your blood pressure checked: Every 3-5 years if you are 18-39 years of age. Every year if you are 40 years old or older. Diabetes Have regular diabetes screenings. This checks your fasting blood sugar level. Have the screening done: Once every three years after age 40 if you are at a normal weight and have a low risk for diabetes. More often and at a younger age if you are overweight or have a high risk for diabetes. What should I know about preventing infection? Hepatitis B If you have a higher risk for hepatitis B, you should be screened for this virus. Talk with your health care provider to find out if you are at risk for hepatitis B infection. Hepatitis C Testing is recommended for: Everyone born from 1945 through 1965. Anyone with known risk factors for hepatitis C. Sexually transmitted infections (STIs) Get screened for STIs, including gonorrhea and chlamydia, if: You are sexually active and are younger than 62 years of age. You are older than 62 years of age and your health care provider tells you that you are at risk for this type of infection. Your sexual activity has changed since you were last screened, and you are at increased risk for chlamydia or gonorrhea. Ask your health care provider if you are at risk. Ask your health care provider about whether you are at high risk for HIV. Your health care provider may recommend a prescription medicine   to help prevent HIV infection. If you choose to take medicine to prevent HIV, you should first get tested for HIV. You should then be tested every 3 months for as long as you are taking the medicine. Pregnancy If you are about to stop having your period (premenopausal) and you may become pregnant, seek counseling before you get  pregnant. Take 400 to 800 micrograms (mcg) of folic acid every day if you become pregnant. Ask for birth control (contraception) if you want to prevent pregnancy. Osteoporosis and menopause Osteoporosis is a disease in which the bones lose minerals and strength with aging. This can result in bone fractures. If you are 28 years old or older, or if you are at risk for osteoporosis and fractures, ask your health care provider if you should: Be screened for bone loss. Take a calcium or vitamin D supplement to lower your risk of fractures. Be given hormone replacement therapy (HRT) to treat symptoms of menopause. Follow these instructions at home: Lifestyle Do not use any products that contain nicotine or tobacco, such as cigarettes, e-cigarettes, and chewing tobacco. If you need help quitting, ask your health care provider. Do not use street drugs. Do not share needles. Ask your health care provider for help if you need support or information about quitting drugs. Alcohol use Do not drink alcohol if: Your health care provider tells you not to drink. You are pregnant, may be pregnant, or are planning to become pregnant. If you drink alcohol: Limit how much you use to 0-1 drink a day. Limit intake if you are breastfeeding. Be aware of how much alcohol is in your drink. In the [place] one drink equals one 12 oz bottle of beer (355 mL), one 5 oz glass of wine (148 mL), or one 1 oz glass of hard liquor (44 mL). General instructions Schedule regular health, dental, and eye exams. Stay current with your vaccines. Tell your health care provider if: You often feel depressed. You have ever been abused or do not feel safe at home. Summary Adopting a healthy lifestyle and getting preventive care are important in promoting health and wellness. Follow your health care provider's instructions about healthy diet, exercising, and getting tested or screened for diseases. Follow your health care provider's  instructions on monitoring your cholesterol and blood pressure. This information is not intended to replace advice given to you by your health care provider. Make sure you discuss any questions you have with your health care provider. Document Revised: 12/01/2020 Document Reviewed: 09/16/2018 Elsevier Patient Education  [place].  Please call Dr. Lorie Apley office for a colonoscopy.    DR Collene Mares  [phone]

## 2021-07-24 LAB — CMP14+EGFR
ALT: 11 IU/L (ref 0–32)
AST: 20 IU/L (ref 0–40)
Albumin/Globulin Ratio: 2 (ref 1.2–2.2)
Albumin: 5.1 g/dL — ABNORMAL HIGH (ref 3.8–4.8)
Alkaline Phosphatase: 78 IU/L (ref 44–121)
BUN/Creatinine Ratio: 17 (ref 12–28)
BUN: 12 mg/dL (ref 8–27)
Bilirubin Total: 1.2 mg/dL (ref 0.0–1.2)
CO2: 25 mmol/L (ref 20–29)
Calcium: 9.9 mg/dL (ref 8.7–10.3)
Chloride: 101 mmol/L (ref 96–106)
Creatinine, Ser: 0.7 mg/dL (ref 0.57–1.00)
Globulin, Total: 2.5 g/dL (ref 1.5–4.5)
Glucose: 95 mg/dL (ref 70–99)
Potassium: 3.8 mmol/L (ref 3.5–5.2)
Sodium: 142 mmol/L (ref 134–144)
Total Protein: 7.6 g/dL (ref 6.0–8.5)
eGFR: 98 mL/min/{1.73_m2} (ref >59)

## 2021-07-24 LAB — CBC
Hematocrit: 36.4 % (ref 34.0–46.6)
Hemoglobin: 12.2 g/dL (ref 11.1–15.9)
MCH: 28.4 pg (ref 26.6–33.0)
MCHC: 33.5 g/dL (ref 31.5–35.7)
MCV: 85 fL (ref 79–97)
Platelets: 336 10*3/uL (ref 150–450)
RBC: 4.29 x10E6/uL (ref 3.77–5.28)
RDW: 14.5 % (ref 11.7–15.4)
WBC: 4.7 10*3/uL (ref 3.4–10.8)

## 2021-07-24 LAB — LIPID PANEL
Chol/HDL Ratio: 4.4 ratio (ref 0.0–4.4)
Cholesterol, Total: 292 mg/dL — ABNORMAL HIGH (ref 100–199)
HDL: 66 mg/dL (ref >39)
LDL Chol Calc (NIH): 210 mg/dL — ABNORMAL HIGH (ref 0–99)
Triglycerides: 95 mg/dL (ref 0–149)
VLDL Cholesterol Cal: 16 mg/dL (ref 5–40)

## 2021-07-25 LAB — CYTOLOGY - PAP: Diagnosis: NEGATIVE

## 2021-07-27 ENCOUNTER — Encounter (HOSPITAL_COMMUNITY): Payer: Self-pay

## 2021-07-27 ENCOUNTER — Other Ambulatory Visit: Payer: Self-pay

## 2021-07-27 ENCOUNTER — Ambulatory Visit (HOSPITAL_COMMUNITY)
Admission: EM | Admit: 2021-07-27 | Discharge: 2021-07-27 | Disposition: A | Discharge status: Home / Self Care | Payer: Medicare HMO | Attending: Physician Assistant | Admitting: Physician Assistant

## 2021-07-27 DIAGNOSIS — L309 Dermatitis, unspecified: Secondary | ICD-10-CM

## 2021-07-27 MED ORDER — PREDNISONE 20 MG PO TABS: 40.0000 mg | ORAL_TABLET | Freq: Every day | ORAL | 0 refills | Status: AC

## 2021-07-27 NOTE — ED Triage Notes (Signed)
Pt presents with concerns of shingles. Pt c/o itching.
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
## 2021-08-02 ENCOUNTER — Encounter: Payer: Self-pay | Admitting: Gastroenterology

## 2021-08-10 IMAGING — CT CT ANGIOGRAPHY CHEST
1 of 6 series · 4 of 16 positions shown · IV contrast (omnipaque)
Comparison: Chest radiograph May 30, 2019

CLINICAL DATA: PE suspected, positive D-dimer, chest pain last week
gas or focal wall with dog 8

EXAM:
CT ANGIOGRAPHY CHEST WITH CONTRAST
TECHNIQUE: Multidetector CT imaging of the chest was performed using the
standard protocol during bolus administration of intravenous
contrast. Multiplanar CT image reconstructions and MIPs were
obtained to evaluate the vascular anatomy.
CONTRAST:  50mL OMNIPAQUE IOHEXOL 350 MG/ML SOLN

[Series 8: pe thins · axial · 0.52mm/px · z∈[+1105,+1279]mm · 4 of 416 slices shown]
[im 84/416  lung]
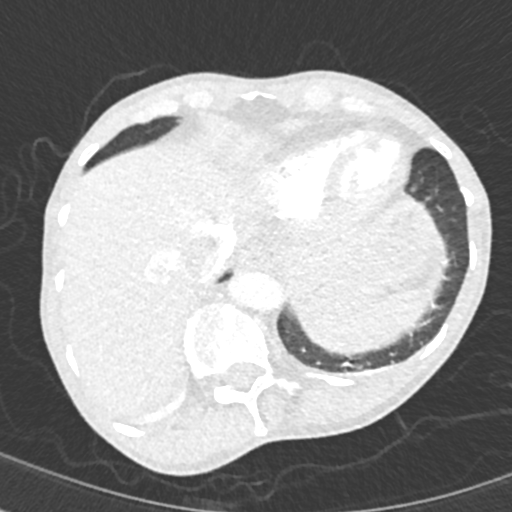
[im 167/416  soft-tissue]
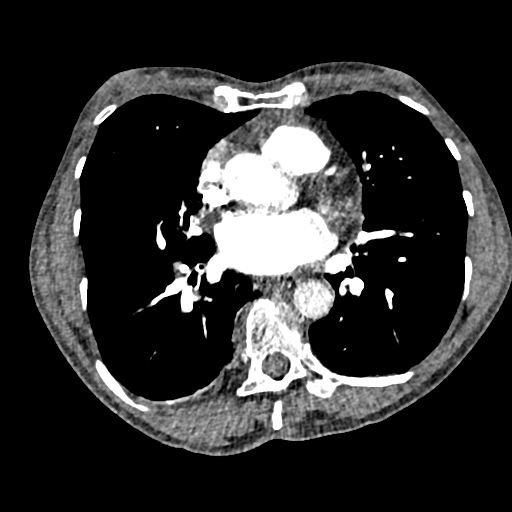
[im 250/416  lung]
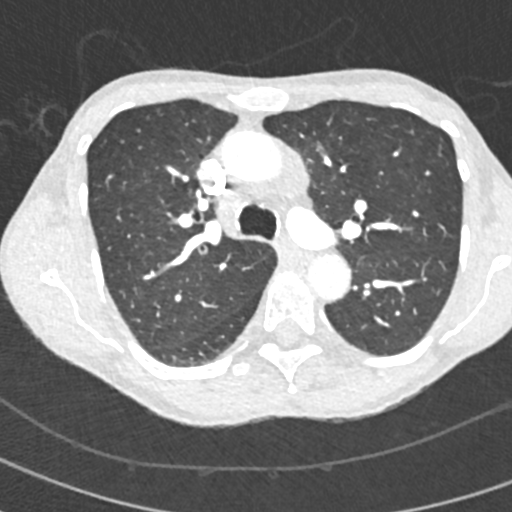
[im 333/416  soft-tissue]
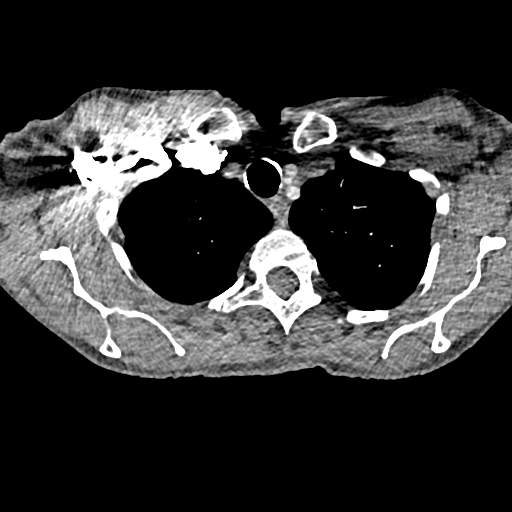

[4 of 16 positions shown; findings below may reference images not displayed]

FINDINGS: Cardiovascular: Satisfactory opacification of the pulmonary arteries
to the segmental level. No evidence of pulmonary embolism or
pulmonary artery enlargement. Atherosclerotic plaque within the
normal caliber aorta. Left vertebral artery arises directly from the
aortic arch. Trace pericardial effusion. Heart is otherwise
unremarkable.

Mediastinum/Nodes: No enlarged hilar, mediastinal or axillary lymph
nodes. Thyroid gland, trachea, and esophagus demonstrate no
significant findings.

Lungs/Pleura: Bandlike areas of scarring/architectural distortion
with associated bronchiectasis in lingula and anterior segment right
upper lobe. No consolidation, features of edema, pneumothorax, or
effusion. No suspicious pulmonary nodules or masses.

Upper Abdomen: No acute abnormalities present in the visualized
portions of the upper abdomen.

Musculoskeletal: Marked levocurvature of the thoracic spine and
dextrocurvature of the included lumbar spine. Resultant chest wall
deformity. Multilevel degenerative changes are present in the imaged
portions of the spine. No other acute or significant osseous
abnormalities.

Review of the MIP images confirms the above findings.
IMPRESSION: No evidence of pulmonary embolism.

No acute intrathoracic process.

Scarring in the lingula and right upper lobe, likely post
infectious/postinflammatory

Aortic Atherosclerosis ([id number]).

Scoliotic curvature of the spine with associated chest wall
deformity.

## 2021-08-18 DIAGNOSIS — J45909 Unspecified asthma, uncomplicated: Secondary | ICD-10-CM | POA: Diagnosis not present

## 2021-09-18 ENCOUNTER — Ambulatory Visit (AMBULATORY_SURGERY_CENTER): Payer: Self-pay | Admitting: *Deleted

## 2021-09-18 ENCOUNTER — Other Ambulatory Visit: Payer: Self-pay

## 2021-09-18 VITALS — Ht <= 58 in | Wt 89.0 lb

## 2021-09-18 DIAGNOSIS — Z1211 Encounter for screening for malignant neoplasm of colon: Secondary | ICD-10-CM

## 2021-09-18 MED ORDER — NA SULFATE-K SULFATE-MG SULF 17.5-3.13-1.6 GM/177ML PO SOLN: 1.0000 | Freq: Once | ORAL | 0 refills | Status: AC

## 2021-09-18 NOTE — Progress Notes (Signed)
No egg or soy allergy known to patient  No issues known to pt with past sedation with any surgeries or procedures Patient denies ever being told they had issues or difficulty with intubation  No FH of Malignant Hyperthermia Pt is not on diet pills Pt is not on  home 02  Pt is not on blood thinners  Pt states issues with constipation daily- she has a hard BM- EOD Bm, some soft, some hard - varies per pt  No A fib or A flutter  Sister Stacey Brock in [place] with pt today - pt states she has used 02 at home in the past  but no use at all in the last year or longer -  we discussed this several times to make sure pt understands and she verbalized along with sister no 02 use at all in over 1 year - pt instructed to call if she leaves [place] today and has to use 02 prior to 12-27 Colon - they verbalized understanding   Pt is fully vaccinated  for Covid  Due to the COVID-19 pandemic we are asking patients to follow certain guidelines in [place] and the [facility]   Pt aware of COVID protocols and [facility] guidelines

## 2021-10-02 ENCOUNTER — Telehealth: Payer: Self-pay

## 2021-10-02 ENCOUNTER — Ambulatory Visit (AMBULATORY_SURGERY_CENTER): Payer: Medicare HMO | Admitting: Gastroenterology

## 2021-10-02 ENCOUNTER — Encounter: Payer: Self-pay | Admitting: Gastroenterology

## 2021-10-02 ENCOUNTER — Other Ambulatory Visit: Payer: Self-pay

## 2021-10-02 VITALS — BP 107/66 | HR 84 | Temp 97.3°F | Resp 22 | Ht <= 58 in | Wt 89.0 lb

## 2021-10-02 DIAGNOSIS — I252 Old myocardial infarction: Secondary | ICD-10-CM | POA: Diagnosis not present

## 2021-10-02 DIAGNOSIS — J45909 Unspecified asthma, uncomplicated: Secondary | ICD-10-CM | POA: Diagnosis not present

## 2021-10-02 DIAGNOSIS — I2510 Atherosclerotic heart disease of native coronary artery without angina pectoris: Secondary | ICD-10-CM | POA: Diagnosis not present

## 2021-10-02 DIAGNOSIS — Z1211 Encounter for screening for malignant neoplasm of colon: Secondary | ICD-10-CM

## 2021-10-02 MED ORDER — SODIUM CHLORIDE 0.9 % IV SOLN: 500.0000 mL | Freq: Once | INTRAVENOUS | Status: DC

## 2021-10-02 NOTE — Telephone Encounter (Signed)
Pt. Left bag with her socks in it in the wheelchair upon departure from [place].  Called her friend who was driving her home to relay information, and see if the pt. Wants the socks.

## 2021-10-02 NOTE — Progress Notes (Signed)
Report given to PACU, vss 

## 2021-10-02 NOTE — Op Note (Signed)
[facility] °Patient Name: Stacey Brock °Procedure Date: 10/02/2021 11:15 AM °MRN: [id number] °Endoscopist:  Mansouraty , MD °Age: 60 °Referring MD:  °Date of Birth: 10/21/1960 °Gender: Female °Account #: [id number] °Procedure:                Colonoscopy °Indications:              Screening for colorectal malignant neoplasm, This  °                          is the patient's first colonoscopy °Medicines:                Monitored Anesthesia Care °Procedure:                Pre-Anesthesia Assessment: °                          - Prior to the procedure, a History and Physical  °                          was performed, and patient medications and  °                          allergies were reviewed. The patient's tolerance of  °                          previous anesthesia was also reviewed. The risks  °                          and benefits of the procedure and the sedation  °                          options and risks were discussed with the patient.  °                          All questions were answered, and informed consent  °                          was obtained. Prior Anticoagulants: The patient has  °                          taken no previous anticoagulant or antiplatelet  °                          agents. ASA Grade Assessment: III - A patient with  °                          severe systemic disease. After reviewing the risks  °                          and benefits, the patient was deemed in  °                          satisfactory condition to undergo the procedure. °                            After obtaining informed consent, the colonoscope                            was passed under direct vision. Throughout the                            procedure, the patient's blood pressure, pulse, and                            oxygen saturations were monitored continuously. The                            0441 [id number]-[id number] Slim SB Colonoscope was introduced                            through the  anus and advanced to the the cecum,                            identified by appendiceal orifice and ileocecal                            valve. The colonoscopy was extremely difficult due                            to significant looping and a tortuous colon.                            Successful completion of the procedure was aided by                            changing the patient's position, using manual                            pressure, withdrawing and reinserting the scope,                            straightening and shortening the scope to obtain                            bowel loop reduction and using scope torsion. The                            patient tolerated the procedure. The quality of the                            bowel preparation was good. The ileocecal valve,                            appendiceal orifice, and rectum were photographed. Scope In: 11:20:41 AM Scope Out: 11:34:57 AM Scope Withdrawal Time: 0 hours 6 minutes 52 seconds  Total Procedure Duration: 0 hours 14 minutes 16 seconds  Findings:                 The digital rectal exam  findings include                            hemorrhoids. Pertinent negatives include no                            palpable rectal lesions.                           The colon (entire examined portion) revealed                            grossly excessive looping.                           Normal mucosa was found in the entire colon.                           Non-bleeding non-thrombosed internal hemorrhoids                            were found during retroflexion, during perianal                            exam and during digital exam. The hemorrhoids were                            Grade II (internal hemorrhoids that prolapse but                            reduce spontaneously). Complications:            No immediate complications. Estimated Blood Loss:     Estimated blood loss was minimal. Estimated blood                             loss: none. Impression:               - Hemorrhoids found on digital rectal exam.                           - There was significant looping of the colon.                           - Normal mucosa in the entire examined colon.                           - Non-bleeding non-thrombosed internal hemorrhoids. Recommendation:           - The patient will be observed post-procedure,                            until all discharge criteria are met.                           - Discharge patient to home.                           -  Patient has a contact number available for                            emergencies. The signs and symptoms of potential                            delayed complications were discussed with the                            patient. Return to normal activities tomorrow.                            Written discharge instructions were provided to the                            patient.                           - High fiber diet.                           - Use FiberCon 1-2 tablets PO daily.                           - Continue present medications.                           - Repeat colonoscopy in 10 years for screening                            purposes.                           - The findings and recommendations were discussed                            with the patient.                           - The findings and recommendations were discussed                            with the designated responsible adult. Justice Britain, MD 10/02/2021 11:40:03 AM

## 2021-10-02 NOTE — Progress Notes (Signed)
VS-Stacey Brock  Pt's states no medical or surgical changes since previsit or office visit.

## 2021-10-02 NOTE — Progress Notes (Signed)
GASTROENTEROLOGY PROCEDURE H&P NOTE   Primary Care Physician: Minette Brine, FNP  HPI: Stacey Brock is a 60 y.o. female who presents for Colonoscopy for screening.  Past Medical History:  Diagnosis Date   Allergy    Anxiety    OCC   Asthma    pt states she has usewd 02 at home in thepast  but NO use in the last year or more as she uses her MDI's daily   Myocardial infarction [facility])    5701,7793 per pt   Sinus trouble    Past Surgical History:  Procedure Laterality Date   FRACTURE SURGERY Left 05/2019   left arm broken   LEFT HEART CATH AND CORONARY ANGIOGRAPHY N/A 06/01/2019   Procedure: LEFT HEART CATH AND CORONARY ANGIOGRAPHY;  Surgeon: Belva Crome, MD;  Location: [facility];  Service: Cardiovascular;  Laterality: N/A;   Current Outpatient Medications  Medication Sig Dispense Refill   albuterol (PROVENTIL) (2.5 MG/3ML) 0.083% nebulizer solution Take 3 mLs (2.5 mg total) by nebulization every 6 (six) hours as needed for wheezing or shortness of breath. 75 mL 12   albuterol (VENTOLIN HFA) 108 (90 Base) MCG/ACT inhaler INHALE 2 PUFFS INTO THE LUNGS EVERY 4 HOURS AS NEEDED FOR WHEEZING 18 g 3   Ascorbic Acid (VITAMIN C GUMMIE PO) Take 1 capsule by mouth daily at 6 (six) AM.     hydrocortisone cream 1 % Apply to affected area 2 times daily (Patient not taking: Reported on 09/18/2021) 30 g 1   loratadine (CLARITIN) 10 MG tablet Take 1 tablet (10 mg total) by mouth at bedtime. 90 tablet 1   montelukast (SINGULAIR) 10 MG tablet Take 1 tablet (10 mg total) by mouth at bedtime. (Patient not taking: Reported on 09/18/2021) 90 tablet 1   rosuvastatin (CRESTOR) 10 MG tablet Take 1 tablet (10 mg total) by mouth daily at 6 PM. (Patient not taking: Reported on 09/18/2021) 90 tablet 1   SYMBICORT 160-4.5 MCG/ACT inhaler Inhale 2 puffs into the lungs 2 (two) times daily. 10.2 g 6   No current facility-administered medications for this visit.    Current Outpatient Medications:     albuterol (PROVENTIL) (2.5 MG/3ML) 0.083% nebulizer solution, Take 3 mLs (2.5 mg total) by nebulization every 6 (six) hours as needed for wheezing or shortness of breath., Disp: 75 mL, Rfl: 12   albuterol (VENTOLIN HFA) 108 (90 Base) MCG/ACT inhaler, INHALE 2 PUFFS INTO THE LUNGS EVERY 4 HOURS AS NEEDED FOR WHEEZING, Disp: 18 g, Rfl: 3   Ascorbic Acid (VITAMIN C GUMMIE PO), Take 1 capsule by mouth daily at 6 (six) AM., Disp: , Rfl:    hydrocortisone cream 1 %, Apply to affected area 2 times daily (Patient not taking: Reported on 09/18/2021), Disp: 30 g, Rfl: 1   loratadine (CLARITIN) 10 MG tablet, Take 1 tablet (10 mg total) by mouth at bedtime., Disp: 90 tablet, Rfl: 1   montelukast (SINGULAIR) 10 MG tablet, Take 1 tablet (10 mg total) by mouth at bedtime. (Patient not taking: Reported on 09/18/2021), Disp: 90 tablet, Rfl: 1   rosuvastatin (CRESTOR) 10 MG tablet, Take 1 tablet (10 mg total) by mouth daily at 6 PM. (Patient not taking: Reported on 09/18/2021), Disp: 90 tablet, Rfl: 1   SYMBICORT 160-4.5 MCG/ACT inhaler, Inhale 2 puffs into the lungs 2 (two) times daily., Disp: 10.2 g, Rfl: 6 Allergies  Allergen Reactions   Aspirin Other (See Comments)    Pt cannot specify reaction type and  unsure if true allergy, but she states " I almost died after I had Aspirin, my heart stopped on operating table".    Ibuprofen Shortness Of Breath    Unsure if this is a true allergy. Patient presents with sweating, tachy, dizziness, flushing   Family History  Problem Relation Age of Onset   Colon cancer Father    Cancer Father        colon   Asthma Sister    Colon polyps Neg Hx    Esophageal cancer Neg Hx    Stomach cancer Neg Hx    Rectal cancer Neg Hx    Social History   Socioeconomic History   Marital status: Single    Spouse name: Not on file   Number of children: 0   Years of education: Not on file   Highest education level: Not on file  Occupational History   Occupation: disabled   Tobacco Use   Smoking status: Never   Smokeless tobacco: Never  Vaping Use   Vaping Use: Never used  Substance and Sexual Activity   Alcohol use: No   Drug use: No   Sexual activity: Not Currently  Other Topics Concern   Not on file  Social History Narrative   Not on file   Social Determinants of Health   Financial Resource Strain: Low Risk    Difficulty of Paying Living Expenses: Not hard at all  Food Insecurity: No Food Insecurity   Worried About Charity fundraiser in the Last Year: Never true   Ran Out of Food in the Last Year: Never true  Transportation Needs: No Transportation Needs   Lack of Transportation (Medical): No   Lack of Transportation (Non-Medical): No  Physical Activity: Sufficiently Active   Days of Exercise per Week: 7 days   Minutes of Exercise per Session: 30 min  Stress: No Stress Concern Present   Feeling of Stress : Not at all  Social Connections: Not on file  Intimate Partner Violence: Not on file    Physical Exam: There were no vitals filed for this visit. There is no height or weight on file to calculate BMI. GEN: NAD EYE: Sclerae anicteric ENT: MMM CV: Non-tachycardic GI: Soft, NT/ND NEURO:  Alert & Oriented x 3  Lab Results: No results for input(s): WBC, HGB, HCT, PLT in the last 72 hours. BMET No results for input(s): NA, K, CL, CO2, GLUCOSE, BUN, CREATININE, CALCIUM in the last 72 hours. LFT No results for input(s): PROT, ALBUMIN, AST, ALT, ALKPHOS, BILITOT, BILIDIR, IBILI in the last 72 hours. PT/INR No results for input(s): LABPROT, INR in the last 72 hours.   Impression / Plan: This is a 60 y.o.female who presents for Colonoscopy for screening.  The risks and benefits of endoscopic evaluation/treatment were discussed with the patient and/or family; these include but are not limited to the risk of perforation, infection, bleeding, missed lesions, lack of diagnosis, severe illness requiring hospitalization, as well as  anesthesia and sedation related illnesses.  The patient's history has been reviewed, patient examined, no change in status, and deemed stable for procedure.  The patient and/or family is agreeable to proceed.    Justice Britain, MD [facility] Office # [phone]

## 2021-10-02 NOTE — Patient Instructions (Signed)
Handouts Provided:  High Fiber Diet  USE FiberCon 1-2 tablets by mouth daily  YOU HAD AN ENDOSCOPIC PROCEDURE TODAY AT [place]:   Refer to the procedure report that was given to you for any specific questions about what was found during the examination.  If the procedure report does not answer your questions, please call your gastroenterologist to clarify.  If you requested that your care partner not be given the details of your procedure findings, then the procedure report has been included in a sealed envelope for you to review at your convenience later.  YOU SHOULD EXPECT: Some feelings of bloating in the abdomen. Passage of more gas than usual.  Walking can help get rid of the air that was put into your GI tract during the procedure and reduce the bloating. If you had a lower endoscopy (such as a colonoscopy or flexible sigmoidoscopy) you may notice spotting of blood in your stool or on the toilet paper. If you underwent a bowel prep for your procedure, you may not have a normal bowel movement for a few days.  Please Note:  You might notice some irritation and congestion in your nose or some drainage.  This is from the oxygen used during your procedure.  There is no need for concern and it should clear up in a day or so.  SYMPTOMS TO REPORT IMMEDIATELY:  Following lower endoscopy (colonoscopy or flexible sigmoidoscopy):  Excessive amounts of blood in the stool  Significant tenderness or worsening of abdominal pains  Swelling of the abdomen that is new, acute  Fever of 100F or higher  For urgent or emergent issues, a gastroenterologist can be reached at any hour by calling [phone]. Do not use MyChart messaging for urgent concerns.    DIET:  We do recommend a small meal at first, but then you may proceed to your regular diet.  Drink plenty of fluids but you should avoid alcoholic beverages for 24 hours.  ACTIVITY:  You should plan to take it easy for the rest of  today and you should NOT DRIVE or use heavy machinery until tomorrow (because of the sedation medicines used during the test).    FOLLOW UP: Our staff will call the number listed on your records 48-72 hours following your procedure to check on you and address any questions or concerns that you may have regarding the information given to you following your procedure. If we do not reach you, we will leave a message.  We will attempt to reach you two times.  During this call, we will ask if you have developed any symptoms of COVID 19. If you develop any symptoms (ie: fever, flu-like symptoms, shortness of breath, cough etc.) before then, please call [phone].  If you test positive for Covid 19 in the 2 weeks post procedure, please call and report this information to [place].    If any biopsies were taken you will be contacted by phone or by letter within the next 1-3 weeks.  Please call us at [phone] if you have not heard about the biopsies in 3 weeks.    SIGNATURES/CONFIDENTIALITY: You and/or your care partner have signed paperwork which will be entered into your electronic medical record.  These signatures attest to the fact that that the information above on your After Visit Summary has been reviewed and is understood.  Full responsibility of the confidentiality of this discharge information lies with you and/or your care-partner.

## 2021-10-04 ENCOUNTER — Telehealth: Payer: Self-pay

## 2021-10-04 NOTE — Telephone Encounter (Signed)
°  Follow up Call-  Call back number 10/02/2021  Post procedure Call Back phone  # [phone] Maudie Mercury, pt does not have a phone  Permission to leave phone message Yes  Some recent data might be hidden     Patient questions:  Do you have a fever, pain , or abdominal swelling? No. Pain Score  0 *  Have you tolerated food without any problems? Yes.    Have you been able to return to your normal activities? Yes.    Do you have any questions about your discharge instructions: Diet   No. Medications  No. Follow up visit  No.  Do you have questions or concerns about your Care? No.  Actions: * If pain score is 4 or above: No action needed, pain <4.

## 2021-10-09 ENCOUNTER — Telehealth: Payer: Self-pay | Admitting: Nurse Practitioner

## 2021-10-09 NOTE — Telephone Encounter (Signed)
Tried calling patient to schedule Medicare Annual Wellness Visit (AWV) either virtually or in office.   No answer    Last AWV ;01/17/21  calendar year humana  please schedule at anytime with [facility]    This should be a 40 minute visit.

## 2021-10-17 ENCOUNTER — Telehealth: Payer: Self-pay | Admitting: Nurse Practitioner

## 2021-10-17 NOTE — Telephone Encounter (Signed)
Tried calling patient to schedule Medicare Annual Wellness Visit (AWV) either virtually or in office.    No answer    Last AWV ;01/17/21 please schedule at anytime with [facility]    If patient has humana  can schedule calendar year  This should be a 45 minute visit.

## 2021-10-31 ENCOUNTER — Telehealth: Payer: Self-pay | Admitting: Nurse Practitioner

## 2021-10-31 NOTE — Telephone Encounter (Signed)
Tried calling patient to schedule Medicare Annual Wellness Visit (AWV) either virtually or in office.   No answer   Last AWV 01/17/21 please schedule at anytime with [place] can be scheduled calendar year uhc insurance  This should be a 45 minute visit.

## 2021-11-01 DIAGNOSIS — J45909 Unspecified asthma, uncomplicated: Secondary | ICD-10-CM | POA: Diagnosis not present

## 2021-11-12 ENCOUNTER — Telehealth: Payer: Self-pay | Admitting: Nurse Practitioner

## 2021-11-12 NOTE — Telephone Encounter (Signed)
Tried calling patient to schedule Medicare Annual Wellness Visit (AWV) either virtually or in office.   No answer  Last AWV 01/17/21  please schedule at anytime with [place] can do awv's calender year   This should be a 45 minute visit.

## 2021-11-18 DIAGNOSIS — J45909 Unspecified asthma, uncomplicated: Secondary | ICD-10-CM | POA: Diagnosis not present

## 2021-11-29 ENCOUNTER — Telehealth: Payer: Self-pay | Admitting: Nurse Practitioner

## 2021-11-29 NOTE — Telephone Encounter (Signed)
Tried calling patient to schedule Medicare Annual Wellness Visit (AWV) either virtually or in office.    No answer  Last AWV 01/17/21;  please schedule at anytime with [facility]    This should be a 45 minute visit.

## 2021-12-04 ENCOUNTER — Telehealth: Payer: Self-pay | Admitting: Nurse Practitioner

## 2021-12-04 NOTE — Telephone Encounter (Signed)
Left message for patient to call back and schedule Medicare Annual Wellness Visit (AWV) either virtually or in office.  Left both my jabber number [phone] and office number    Last AWV 01/17/21  please schedule at anytime with [facility]    This should be a 45 minute visit.   Awv can be done calendar year [place]

## 2021-12-16 DIAGNOSIS — J45909 Unspecified asthma, uncomplicated: Secondary | ICD-10-CM | POA: Diagnosis not present

## 2021-12-18 ENCOUNTER — Telehealth: Payer: Self-pay | Admitting: Nurse Practitioner

## 2021-12-18 NOTE — Telephone Encounter (Signed)
Tried calling patient to  schedule Medicare Annual Wellness Visit (AWV) either virtually or in office.  ? ?No answer  ? ?Last AWV 01/17/21 ? please schedule at anytime with [facility]  ? ?Can scheduled awv calendar year uhc ? ? ?This should be a 45 minute visit.  ?

## 2022-01-16 ENCOUNTER — Telehealth: Payer: Self-pay | Admitting: Nurse Practitioner

## 2022-01-16 DIAGNOSIS — J45909 Unspecified asthma, uncomplicated: Secondary | ICD-10-CM | POA: Diagnosis not present

## 2022-01-16 NOTE — Telephone Encounter (Signed)
Tried calling patient to schedule Medicare Annual Wellness Visit (AWV) either virtually or in office. ? ?No answer ? ?Last AWV 01/17/21 ? please schedule at anytime with [facility] Nurse Health Advisor 1 or 2 ? ? ? ?

## 2022-01-23 ENCOUNTER — Ambulatory Visit (INDEPENDENT_AMBULATORY_CARE_PROVIDER_SITE_OTHER): Payer: Medicare Other | Admitting: Nurse Practitioner

## 2022-01-23 ENCOUNTER — Encounter: Payer: Self-pay | Admitting: Nurse Practitioner

## 2022-01-23 VITALS — BP 128/66 | HR 104 | Temp 98.7°F | Ht <= 58 in | Wt 85.8 lb

## 2022-01-23 DIAGNOSIS — J4520 Mild intermittent asthma, uncomplicated: Secondary | ICD-10-CM

## 2022-01-23 DIAGNOSIS — I700 Atherosclerosis of aorta: Secondary | ICD-10-CM

## 2022-01-23 DIAGNOSIS — E782 Mixed hyperlipidemia: Secondary | ICD-10-CM

## 2022-01-23 DIAGNOSIS — E785 Hyperlipidemia, unspecified: Secondary | ICD-10-CM | POA: Diagnosis not present

## 2022-01-23 DIAGNOSIS — Z1231 Encounter for screening mammogram for malignant neoplasm of breast: Secondary | ICD-10-CM

## 2022-01-23 MED ORDER — ROSUVASTATIN CALCIUM 10 MG PO TABS: 10.0000 mg | ORAL_TABLET | Freq: Every day | ORAL | 1 refills | Status: DC

## 2022-01-23 MED ORDER — LORATADINE 10 MG PO TABS: 10.0000 mg | ORAL_TABLET | Freq: Every day | ORAL | 1 refills | Status: DC

## 2022-01-23 MED ORDER — MONTELUKAST SODIUM 10 MG PO TABS: 10.0000 mg | ORAL_TABLET | Freq: Every day | ORAL | 1 refills | Status: DC

## 2022-01-23 NOTE — Progress Notes (Signed)
?Industrial/product designer as a Education administrator for [employer], FNP.,have documented all relevant documentation on the behalf of Minette Brine, FNP,as directed by  Minette Brine, FNP while in the presence of Minette Brine, [place]. ? ?This visit occurred during the SARS-CoV-2 public health emergency.  Safety protocols were in place, including screening questions prior to the visit, additional usage of staff PPE, and extensive cleaning of exam room while observing appropriate contact time as indicated for disinfecting solutions. ? ?Subjective:  ?  ? Patient ID: Stacey Brock , female    DOB: April 21, 1961 , 61 y.o.   MRN: [id number] ? ? ?Chief Complaint  ?Patient presents with  ? Hyperlipidemia  ? ? ?HPI ? ?Patient presents for cholesterol follow up.  She reports her sister has been cussing her out and causing a lot of problems at home. She reports her sister Shirlean Mylar causes her stress concerned about others. She is awaiting the social worker to call her back. She reports her sister will take her montelukast and if she has prednisone she will try to take that, the patient reports she will tell her to call her doctor. She reports during school she was "mentally challenged" and was in "special ed". She did graduate from high school  ? ?Hyperlipidemia ?This is a chronic problem. The current episode started more than 1 year ago. The problem is controlled. She has no history of chronic renal disease. Factors aggravating her hyperlipidemia include fatty foods. Current antihyperlipidemic treatment includes statins. The current treatment provides mild improvement of lipids. Risk factors for coronary artery disease include a sedentary lifestyle and stress.   ? ?Past Medical History:  ?Diagnosis Date  ? Allergy   ? Anxiety   ? OCC  ? Asthma   ? pt states she has usewd 02 at home in thepast  but NO use in the last year or more as she uses her MDI's daily  ? Myocardial infarction [facility])   ? 5830,9407 per pt  ? Non-ST elevation (NSTEMI) myocardial  infarction ([place]) 06/01/2019  ? Sinus trouble   ?  ? ?Family History  ?Problem Relation Age of Onset  ? Colon cancer Father   ? Cancer Father   ?     colon  ? Asthma Sister   ? Colon polyps Neg Hx   ? Esophageal cancer Neg Hx   ? Stomach cancer Neg Hx   ? Rectal cancer Neg Hx   ? ? ? ?Current Outpatient Medications:  ?  albuterol (PROVENTIL) (2.5 MG/3ML) 0.083% nebulizer solution, Take 3 mLs (2.5 mg total) by nebulization every 6 (six) hours as needed for wheezing or shortness of breath., Disp: 75 mL, Rfl: 12 ?  albuterol (VENTOLIN HFA) 108 (90 Base) MCG/ACT inhaler, INHALE 2 PUFFS INTO THE LUNGS EVERY 4 HOURS AS NEEDED FOR WHEEZING, Disp: 18 g, Rfl: 3 ?  loratadine (CLARITIN) 10 MG tablet, Take 1 tablet (10 mg total) by mouth at bedtime., Disp: 90 tablet, Rfl: 1 ?  montelukast (SINGULAIR) 10 MG tablet, Take 1 tablet (10 mg total) by mouth at bedtime., Disp: 90 tablet, Rfl: 1 ?  rosuvastatin (CRESTOR) 10 MG tablet, Take 1 tablet (10 mg total) by mouth daily at 6 PM., Disp: 90 tablet, Rfl: 1 ?  SYMBICORT 160-4.5 MCG/ACT inhaler, Inhale 2 puffs into the lungs 2 (two) times daily., Disp: 10.2 g, Rfl: 6  ? ?Allergies  ?Allergen Reactions  ? Aspirin Other (See Comments)  ?  Pt cannot specify reaction type and unsure if true allergy, but  she states " I almost died after I had Aspirin, my heart stopped on operating table".   ? Ibuprofen Shortness Of Breath  ?  Unsure if this is a true allergy. Patient presents with sweating, tachy, dizziness, flushing  ?  ? ?Review of Systems  ?Constitutional: Negative.   ?Respiratory: Negative.    ?Cardiovascular: Negative.   ?Gastrointestinal: Negative.   ?Neurological: Negative.    ? ?Today's Vitals  ? 01/23/22 1507  ?BP: 128/66  ?Pulse: (!) 104  ?Temp: 98.7 ?F (37.1 ?C)  ?TempSrc: Oral  ?Weight: 85 lb 12.8 oz (38.9 kg)  ?Height: _0  (1.448 m)  ? ?Body mass index is 18.57 kg/m?.  ?Wt Readings from Last 3 Encounters:  ?01/23/22 85 lb 12.8 oz (38.9 kg)  ?10/02/21 89 lb (40.4 kg)   ?09/18/21 89 lb (40.4 kg)  ? ? ?Objective:  ?Physical Exam ?Vitals reviewed.  ?Constitutional:   ?   General: She is not in acute distress. ?   Appearance: Normal appearance.  ?Cardiovascular:  ?   Rate and Rhythm: Normal rate and regular rhythm.  ?   Pulses: Normal pulses.  ?   Heart sounds: Normal heart sounds. No murmur heard. ?Pulmonary:  ?   Effort: Pulmonary effort is normal. No respiratory distress.  ?   Breath sounds: No wheezing.  ?Musculoskeletal:  ?   Thoracic back: Scoliosis present.  ?Neurological:  ?   Mental Status: She is alert.  ?  ? ?   ?Assessment And Plan:  ?   ?1. Mixed hyperlipidemia ?Comments: Continue taking statin, tolerating well  ?- Lipid panel ?- BMP8+EGFR ?- rosuvastatin (CRESTOR) 10 MG tablet; Take 1 tablet (10 mg total) by mouth daily at 6 PM.  Dispense: 90 tablet; Refill: 1 ? ?2. Atherosclerosis of aorta ([place]) ?Comments: Continue statin, tolerating well ?- BMP8+EGFR ? ?3. Mild intermittent asthma without complication ?Comments: Stable, no current issues or exacerbation ?- montelukast (SINGULAIR) 10 MG tablet; Take 1 tablet (10 mg total) by mouth at bedtime.  Dispense: 90 tablet; Refill: 1 ?- loratadine (CLARITIN) 10 MG tablet; Take 1 tablet (10 mg total) by mouth at bedtime.  Dispense: 90 tablet; Refill: 1 ? ?4. Encounter for screening mammogram for breast cancer ?Pt instructed on Self Breast Exam.According to ACOG guidelines Women aged 59 and older are recommended to get an annual mammogram. Form completed and given to patient contact the [facility] for appointment scheduing.  ?Pt encouraged to get annual mammogram ?- MM DIGITAL SCREENING BILATERAL; Future ?  ? ?She will ramble during the visit with extensive stories about other people she is associated with. 6CIT is 4. I do not think she is capable to live alone ? ?Patient was given opportunity to ask questions. Patient verbalized understanding of the plan and was able to repeat key elements of the plan. All questions were  answered to their satisfaction.  ?Minette Brine, FNP  ? ?I, Minette Brine, FNP, have reviewed all documentation for this visit. The documentation on 01/23/22 for the exam, diagnosis, procedures, and orders are all accurate and complete.  ? ?IF YOU HAVE BEEN REFERRED TO A SPECIALIST, IT MAY TAKE 1-2 WEEKS TO SCHEDULE/PROCESS THE REFERRAL. IF YOU HAVE NOT HEARD FROM US/SPECIALIST IN TWO WEEKS, PLEASE GIVE [place] A CALL AT [phone] X [phone].  ? ?THE PATIENT IS ENCOURAGED TO PRACTICE SOCIAL DISTANCING DUE TO THE COVID-19 PANDEMIC.   ?

## 2022-01-23 NOTE — Patient Instructions (Signed)

## 2022-01-24 LAB — BMP8+EGFR
BUN/Creatinine Ratio: 23 (ref 12–28)
BUN: 15 mg/dL (ref 8–27)
CO2: 24 mmol/L (ref 20–29)
Calcium: 9.5 mg/dL (ref 8.7–10.3)
Chloride: 104 mmol/L (ref 96–106)
Creatinine, Ser: 0.66 mg/dL (ref 0.57–1.00)
Glucose: 106 mg/dL — ABNORMAL HIGH (ref 70–99)
Potassium: 4 mmol/L (ref 3.5–5.2)
Sodium: 145 mmol/L — ABNORMAL HIGH (ref 134–144)
eGFR: 99 mL/min/{1.73_m2} (ref >59)

## 2022-01-24 LAB — LIPID PANEL
Chol/HDL Ratio: 3.6 ratio (ref 0.0–4.4)
Cholesterol, Total: 279 mg/dL — ABNORMAL HIGH (ref 100–199)
HDL: 78 mg/dL (ref >39)
LDL Chol Calc (NIH): 186 mg/dL — ABNORMAL HIGH (ref 0–99)
Triglycerides: 88 mg/dL (ref 0–149)
VLDL Cholesterol Cal: 15 mg/dL (ref 5–40)

## 2022-02-12 ENCOUNTER — Telehealth: Payer: Self-pay | Admitting: Nurse Practitioner

## 2022-02-12 NOTE — Telephone Encounter (Signed)
Tried calling patient schedule Medicare Annual Wellness Visit (AWV) either virtually or in office.   ? ?No answer  ? ?Last AWV 01/17/21 ? please schedule at anytime with [facility]  ? ? ? ?

## 2022-02-15 DIAGNOSIS — J45909 Unspecified asthma, uncomplicated: Secondary | ICD-10-CM | POA: Diagnosis not present

## 2022-02-25 ENCOUNTER — Telehealth: Payer: Self-pay | Admitting: Nurse Practitioner

## 2022-02-25 NOTE — Telephone Encounter (Signed)
Tried calling patient to schedule Medicare Annual Wellness Visit (AWV) either virtually or in office.  No answer  Last AWV ;01/17/21 please schedule at anytime with [facility]

## 2022-03-18 DIAGNOSIS — J45909 Unspecified asthma, uncomplicated: Secondary | ICD-10-CM | POA: Diagnosis not present

## 2022-04-17 DIAGNOSIS — J45909 Unspecified asthma, uncomplicated: Secondary | ICD-10-CM | POA: Diagnosis not present

## 2022-04-30 ENCOUNTER — Telehealth: Payer: Self-pay | Admitting: Nurse Practitioner

## 2022-04-30 NOTE — Telephone Encounter (Signed)
Tried calling patient to schedule Medicare Annual Wellness Visit (AWV) either virtually or in office.  Left both my jabber number [phone] and office number   No answer   Last AWV 01/17/21 ; please schedule at anytime with [facility]

## 2022-05-08 ENCOUNTER — Telehealth: Payer: Self-pay | Admitting: Nurse Practitioner

## 2022-05-08 NOTE — Telephone Encounter (Signed)
Left message for patient to call back and schedule Medicare Annual Wellness Visit (AWV) either virtually or in office.  Left both my jabber number [phone] and office number    Last AWV ;01/17/21  please schedule at anytime with [facility]

## 2022-05-15 ENCOUNTER — Telehealth: Payer: Self-pay | Admitting: Nurse Practitioner

## 2022-05-15 NOTE — Telephone Encounter (Signed)
Left message for patient to call back and schedule Medicare Annual Wellness Visit (AWV) either virtually or in office.  Left both my jabber number [phone] and office number    Last AWV ;01/17/21 please schedule at anytime with [facility]

## 2022-05-18 DIAGNOSIS — J45909 Unspecified asthma, uncomplicated: Secondary | ICD-10-CM | POA: Diagnosis not present

## 2022-05-27 ENCOUNTER — Telehealth: Payer: Self-pay | Admitting: Nurse Practitioner

## 2022-05-27 NOTE — Telephone Encounter (Signed)
Left message for patient to call back and schedule Medicare Annual Wellness Visit (AWV) either virtually or in office.  Left both my jabber number [phone] and office number    Last AWV ;01/17/21  please schedule at anytime with [facility]

## 2022-06-06 ENCOUNTER — Telehealth: Payer: Self-pay

## 2022-06-06 NOTE — Telephone Encounter (Signed)
Attempted to call pt to perform AWV visit unable to leave vm YL,RMA

## 2022-06-18 DIAGNOSIS — J45909 Unspecified asthma, uncomplicated: Secondary | ICD-10-CM | POA: Diagnosis not present

## 2022-07-18 DIAGNOSIS — J45909 Unspecified asthma, uncomplicated: Secondary | ICD-10-CM | POA: Diagnosis not present

## 2022-07-24 ENCOUNTER — Encounter: Payer: Self-pay | Admitting: Nurse Practitioner

## 2022-07-24 ENCOUNTER — Ambulatory Visit (INDEPENDENT_AMBULATORY_CARE_PROVIDER_SITE_OTHER): Payer: Medicare Other | Admitting: Nurse Practitioner

## 2022-07-24 VITALS — BP 120/70 | HR 101 | Temp 98.8°F | Ht <= 58 in | Wt 80.0 lb

## 2022-07-24 DIAGNOSIS — R7309 Other abnormal glucose: Secondary | ICD-10-CM | POA: Diagnosis not present

## 2022-07-24 DIAGNOSIS — G8929 Other chronic pain: Secondary | ICD-10-CM

## 2022-07-24 DIAGNOSIS — I700 Atherosclerosis of aorta: Secondary | ICD-10-CM

## 2022-07-24 DIAGNOSIS — Z0000 Encounter for general adult medical examination without abnormal findings: Secondary | ICD-10-CM

## 2022-07-24 DIAGNOSIS — M545 Low back pain, unspecified: Secondary | ICD-10-CM

## 2022-07-24 DIAGNOSIS — Z23 Encounter for immunization: Secondary | ICD-10-CM

## 2022-07-24 DIAGNOSIS — J4520 Mild intermittent asthma, uncomplicated: Secondary | ICD-10-CM

## 2022-07-24 DIAGNOSIS — M546 Pain in thoracic spine: Secondary | ICD-10-CM

## 2022-07-24 DIAGNOSIS — Z79899 Other long term (current) drug therapy: Secondary | ICD-10-CM

## 2022-07-24 DIAGNOSIS — E782 Mixed hyperlipidemia: Secondary | ICD-10-CM | POA: Diagnosis not present

## 2022-07-24 MED ORDER — MONTELUKAST SODIUM 10 MG PO TABS: 10.0000 mg | ORAL_TABLET | Freq: Every day | ORAL | 1 refills | Status: DC

## 2022-07-24 MED ORDER — ALBUTEROL SULFATE HFA 108 (90 BASE) MCG/ACT IN AERS: INHALATION_SPRAY | RESPIRATORY_TRACT | 3 refills | Status: DC

## 2022-07-24 MED ORDER — ROSUVASTATIN CALCIUM 10 MG PO TABS: 10.0000 mg | ORAL_TABLET | Freq: Every day | ORAL | 1 refills | Status: DC

## 2022-07-24 MED ORDER — LORATADINE 10 MG PO TABS: 10.0000 mg | ORAL_TABLET | Freq: Every day | ORAL | 1 refills | Status: DC

## 2022-07-24 MED ORDER — SYMBICORT 160-4.5 MCG/ACT IN AERO: 2.0000 | INHALATION_SPRAY | Freq: Two times a day (BID) | RESPIRATORY_TRACT | 6 refills | Status: DC

## 2022-07-24 NOTE — Progress Notes (Signed)
I,Tianna Badgett,acting as a Education administrator for [employer], FNP.,have documented all relevant documentation on the behalf of Minette Brine, FNP,as directed by  Minette Brine, FNP while in the presence of Minette Brine, [place].  Subjective:     Patient ID: Stacey Brock , female    DOB: 1961/05/07 , 61 y.o.   MRN: [id number]   Chief Complaint  Patient presents with   Annual Exam    HPI  Here for HM. She has a new phone number.   Wt Readings from Last 3 Encounters: 07/24/22 : 80 lb (36.3 kg) 01/23/22 : 85 lb 12.8 oz (38.9 kg) 10/02/21 : 89 lb (40.4 kg)     Past Medical History:  Diagnosis Date   Allergy    Anxiety    OCC   Asthma    pt states she has usewd 02 at home in thepast  but NO use in the last year or more as she uses her MDI's daily   Myocardial infarction [facility])    [phone] per pt   Non-ST elevation (NSTEMI) myocardial infarction ([place]) 06/01/2019   Sinus trouble      Family History  Problem Relation Age of Onset   Colon cancer Father    Cancer Father        colon   Asthma Sister    Colon polyps Neg Hx    Esophageal cancer Neg Hx    Stomach cancer Neg Hx    Rectal cancer Neg Hx      Current Outpatient Medications:    albuterol (PROVENTIL) (2.5 MG/3ML) 0.083% nebulizer solution, Take 3 mLs (2.5 mg total) by nebulization every 6 (six) hours as needed for wheezing or shortness of breath., Disp: 75 mL, Rfl: 12   albuterol (VENTOLIN HFA) 108 (90 Base) MCG/ACT inhaler, INHALE 2 PUFFS INTO THE LUNGS EVERY 4 HOURS AS NEEDED FOR WHEEZING, Disp: 18 g, Rfl: 3   loratadine (CLARITIN) 10 MG tablet, Take 1 tablet (10 mg total) by mouth at bedtime., Disp: 90 tablet, Rfl: 1   montelukast (SINGULAIR) 10 MG tablet, Take 1 tablet (10 mg total) by mouth at bedtime., Disp: 90 tablet, Rfl: 1   rosuvastatin (CRESTOR) 10 MG tablet, Take 1 tablet (10 mg total) by mouth daily at 6 PM., Disp: 90 tablet, Rfl: 1   SYMBICORT 160-4.5 MCG/ACT inhaler, Inhale 2 puffs into the lungs 2 (two) times  daily., Disp: 10.2 g, Rfl: 6   Allergies  Allergen Reactions   Aspirin Other (See Comments)    Pt cannot specify reaction type and unsure if true allergy, but she states " I almost died after I had Aspirin, my heart stopped on operating table".    Ibuprofen Shortness Of Breath    Unsure if this is a true allergy. Patient presents with sweating, tachy, dizziness, flushing      The patient states she is post menopausal status.  No LMP recorded. Patient is postmenopausal.. Negative for Dysmenorrhea and Negative for Menorrhagia. Negative for: breast discharge, breast lump(s), breast pain and breast self exam. Associated symptoms include abnormal vaginal bleeding. Pertinent negatives include abnormal bleeding (hematology), anxiety, decreased libido, depression, difficulty falling sleep, dyspareunia, history of infertility, nocturia, sexual dysfunction, sleep disturbances, urinary incontinence, urinary urgency, vaginal discharge and vaginal itching. Diet regular; she is trying to limit her sweet intake. The patient states her exercise level is minimal.   The patient's tobacco use is:  Social History   Tobacco Use  Smoking Status Never  Smokeless Tobacco Never  She has been  exposed to passive smoke. The patient's alcohol use is:  Social History   Substance and Sexual Activity  Alcohol Use No  . Additional information: Last pap 07/23/2021, next one scheduled for 10//17/2025.    Review of Systems  Constitutional: Negative.   HENT: Negative.    Eyes: Negative.   Respiratory: Negative.    Cardiovascular: Negative.   Gastrointestinal: Negative.   Endocrine: Negative.   Genitourinary: Negative.   Musculoskeletal: Negative.   Skin: Negative.   Allergic/Immunologic: Negative.   Neurological: Negative.   Hematological: Negative.   Psychiatric/Behavioral: Negative.       Today's Vitals   07/24/22 1422  BP: 120/70  Pulse: (!) 101  Temp: 98.8 F (37.1 C)  TempSrc: Oral  Weight: 80 lb  (36.3 kg)  Height: $Remove'4\' 9"'[id number]$  (1.448 m)   Body mass index is 17.31 kg/m.  Wt Readings from Last 3 Encounters:  07/24/22 80 lb (36.3 kg)  01/23/22 85 lb 12.8 oz (38.9 kg)  10/02/21 89 lb (40.4 kg)    Objective:  Physical Exam Vitals reviewed. Exam conducted with a chaperone present.  Constitutional:      General: She is not in acute distress.    Appearance: Normal appearance. She is well-developed.  HENT:     Head: Normocephalic and atraumatic.     Right Ear: Hearing, tympanic membrane, ear canal and external ear normal. There is no impacted cerumen.     Left Ear: Hearing, tympanic membrane, ear canal and external ear normal. There is no impacted cerumen.     Nose:     Comments: Deferred - masked    Mouth/Throat:     Comments: Deferred - masked Eyes:     General: Lids are normal.     Extraocular Movements: Extraocular movements intact.     Conjunctiva/sclera: Conjunctivae normal.     Pupils: Pupils are equal, round, and reactive to light.     Funduscopic exam:    Right eye: No papilledema.        Left eye: No papilledema.  Neck:     Thyroid: No thyroid mass.     Vascular: No carotid bruit.  Cardiovascular:     Rate and Rhythm: Normal rate and regular rhythm.     Pulses: Normal pulses.     Heart sounds: Normal heart sounds. No murmur heard. Pulmonary:     Effort: Pulmonary effort is normal. No respiratory distress.     Breath sounds: Normal breath sounds. No wheezing.  Chest:     Chest wall: No mass.  Breasts:    Tanner Score is 5.     Right: Normal. No mass or tenderness.     Left: Normal. No mass or tenderness.  Abdominal:     General: Abdomen is flat. Bowel sounds are normal. There is no distension.     Palpations: Abdomen is soft.     Tenderness: There is no abdominal tenderness.  Musculoskeletal:        General: No swelling or tenderness. Normal range of motion.     Cervical back: Full passive range of motion without pain, normal range of motion and neck supple.      Right lower leg: No edema.     Left lower leg: No edema.     Comments: She has a curvature to her spine  Lymphadenopathy:     Upper Body:     Right upper body: No supraclavicular, axillary or pectoral adenopathy.     Left upper body: No supraclavicular, axillary or pectoral  adenopathy.  Skin:    General: Skin is warm and dry.     Capillary Refill: Capillary refill takes less than 2 seconds.  Neurological:     General: No focal deficit present.     Mental Status: She is alert and oriented to person, place, and time.     Cranial Nerves: No cranial nerve deficit.     Sensory: No sensory deficit.     Motor: No weakness.  Psychiatric:        Mood and Affect: Mood normal.        Behavior: Behavior normal.        Thought Content: Thought content normal.        Judgment: Judgment normal.         Assessment And Plan:     1. Health maintenance examination Behavior modifications discussed and diet history reviewed.   Pt will continue to exercise regularly and modify diet with low GI, plant based foods and decrease intake of processed foods.  Recommend intake of daily multivitamin, Vitamin D, and calcium.  Recommend mammogram and colonoscopy for preventive screenings, as well as recommend immunizations that include influenza, TDAP, and Shingles   2. Need for influenza vaccination Influenza vaccine administered Encouraged to take Tylenol as needed for fever or muscle aches. - Flu Vaccine QUAD 57mo+IM (Fluarix, Fluzone & Alfiuria Quad PF)  3. Need for Tdap vaccination Will give tetanus vaccine today while in office. Refer to order management. TDAP will be administered to adults 14-22 years old every 10 years. - Tdap vaccine greater than or equal to 7yo IM  4. Atherosclerosis of aorta (HCC) Comments: Continue statin, tolerating well.  5. Mild intermittent asthma without complication Comments: Stable, no current issues or exacerbation. Refilled her albuterol and montelukast -  loratadine (CLARITIN) 10 MG tablet; Take 1 tablet (10 mg total) by mouth at bedtime.  Dispense: 90 tablet; Refill: 1 - montelukast (SINGULAIR) 10 MG tablet; Take 1 tablet (10 mg total) by mouth at bedtime.  Dispense: 90 tablet; Refill: 1 - SYMBICORT 160-4.5 MCG/ACT inhaler; Inhale 2 puffs into the lungs 2 (two) times daily.  Dispense: 10.2 g; Refill: 6  6. Mixed hyperlipidemia Comments: Continue taking statin, tolerating well  - CMP14+EGFR - Lipid panel - rosuvastatin (CRESTOR) 10 MG tablet; Take 1 tablet (10 mg total) by mouth daily at 6 PM.  Dispense: 90 tablet; Refill: 1  7. Chronic bilateral low back pain without sciatica Comments: she has a curvature to her back and reported she sleeps on the floor for several years.  - DG Lumbar Spine Complete; Future  8. Chronic bilateral thoracic back pain Comments: Will check xray I have given her the phone number to [facility]; Future  9. Other long term (current) drug therapy - CBC   Patient was given opportunity to ask questions. Patient verbalized understanding of the plan and was able to repeat key elements of the plan. All questions were answered to their satisfaction.   Minette Brine, FNP   I, Minette Brine, FNP, have reviewed all documentation for this visit. The documentation on 07/24/22 for the exam, diagnosis, procedures, and orders are all accurate and complete.   THE PATIENT IS ENCOURAGED TO PRACTICE SOCIAL DISTANCING DUE TO THE COVID-19 PANDEMIC.

## 2022-07-24 NOTE — Patient Instructions (Addendum)
Health Maintenance, Female Adopting a healthy lifestyle and getting preventive care are important in promoting health and wellness. Ask your health care provider about: The right schedule for you to have regular tests and exams. Things you can do on your own to prevent diseases and keep yourself healthy. What should I know about diet, weight, and exercise? Eat a healthy diet  Eat a diet that includes plenty of vegetables, fruits, low-fat dairy products, and lean protein. Do not eat a lot of foods that are high in solid fats, added sugars, or sodium. Maintain a healthy weight Body mass index (BMI) is used to identify weight problems. It estimates body fat based on height and weight. Your health care provider can help determine your BMI and help you achieve or maintain a healthy weight. Get regular exercise Get regular exercise. This is one of the most important things you can do for your health. Most adults should: Exercise for at least 150 minutes each week. The exercise should increase your heart rate and make you sweat (moderate-intensity exercise). Do strengthening exercises at least twice a week. This is in addition to the moderate-intensity exercise. Spend less time sitting. Even light physical activity can be beneficial. Watch cholesterol and blood lipids Have your blood tested for lipids and cholesterol at 63 years of age, then have this test every 5 years. Have your cholesterol levels checked more often if: Your lipid or cholesterol levels are high. You are older than 63 years of age. You are at high risk for heart disease. What should I know about cancer screening? Depending on your health history and family history, you may need to have cancer screening at various ages. This may include screening for: Breast cancer. Cervical cancer. Colorectal cancer. Skin cancer. Lung cancer. What should I know about heart disease, diabetes, and high blood pressure? Blood pressure and heart  disease High blood pressure causes heart disease and increases the risk of stroke. This is more likely to develop in people who have high blood pressure readings or are overweight. Have your blood pressure checked: Every 3-5 years if you are 18-39 years of age. Every year if you are 40 years old or older. Diabetes Have regular diabetes screenings. This checks your fasting blood sugar level. Have the screening done: Once every three years after age 40 if you are at a normal weight and have a low risk for diabetes. More often and at a younger age if you are overweight or have a high risk for diabetes. What should I know about preventing infection? Hepatitis B If you have a higher risk for hepatitis B, you should be screened for this virus. Talk with your health care provider to find out if you are at risk for hepatitis B infection. Hepatitis C Testing is recommended for: Everyone born from 1945 through 1965. Anyone with known risk factors for hepatitis C. Sexually transmitted infections (STIs) Get screened for STIs, including gonorrhea and chlamydia, if: You are sexually active and are younger than 63 years of age. You are older than 63 years of age and your health care provider tells you that you are at risk for this type of infection. Your sexual activity has changed since you were last screened, and you are at increased risk for chlamydia or gonorrhea. Ask your health care provider if you are at risk. Ask your health care provider about whether you are at high risk for HIV. Your health care provider may recommend a prescription medicine to help prevent HIV   infection. If you choose to take medicine to prevent HIV, you should first get tested for HIV. You should then be tested every 3 months for as long as you are taking the medicine. Pregnancy If you are about to stop having your period (premenopausal) and you may become pregnant, seek counseling before you get pregnant. Take 400 to 800  micrograms (mcg) of folic acid every day if you become pregnant. Ask for birth control (contraception) if you want to prevent pregnancy. Osteoporosis and menopause Osteoporosis is a disease in which the bones lose minerals and strength with aging. This can result in bone fractures. If you are 60 years old or older, or if you are at risk for osteoporosis and fractures, ask your health care provider if you should: Be screened for bone loss. Take a calcium or vitamin D supplement to lower your risk of fractures. Be given hormone replacement therapy (HRT) to treat symptoms of menopause. Follow these instructions at home: Alcohol use Do not drink alcohol if: Your health care provider tells you not to drink. You are pregnant, may be pregnant, or are planning to become pregnant. If you drink alcohol: Limit how much you have to: 0-1 drink a day. Know how much alcohol is in your drink. In the [place] one drink equals one 12 oz bottle of beer (355 mL), one 5 oz glass of wine (148 mL), or one 1 oz glass of hard liquor (44 mL). Lifestyle Do not use any products that contain nicotine or tobacco. These products include cigarettes, chewing tobacco, and vaping devices, such as e-cigarettes. If you need help quitting, ask your health care provider. Do not use street drugs. Do not share needles. Ask your health care provider for help if you need support or information about quitting drugs. General instructions Schedule regular health, dental, and eye exams. Stay current with your vaccines. Tell your health care provider if: You often feel depressed. You have ever been abused or do not feel safe at home. Summary Adopting a healthy lifestyle and getting preventive care are important in promoting health and wellness. Follow your health care provider's instructions about healthy diet, exercising, and getting tested or screened for diseases. Follow your health care provider's instructions on monitoring your  cholesterol and blood pressure. This information is not intended to replace advice given to you by your health care provider. Make sure you discuss any questions you have with your health care provider. Document Revised: 02/12/2021 Document Reviewed: 02/12/2021 Elsevier Patient Education  [place].  Call for an appt with the breast center the number and address is below [facility] [address]  [phone]  You can go to [place] or [facility] at [address] for your xray of your back you do not need an appt.   Address: [address], [place], [place] [address] Areas served: [place] and nearby areas Hours: 6:30?AM-7?PM Phone: [phone]

## 2022-07-25 ENCOUNTER — Other Ambulatory Visit: Payer: Self-pay | Admitting: Nurse Practitioner

## 2022-07-25 ENCOUNTER — Ambulatory Visit (INDEPENDENT_AMBULATORY_CARE_PROVIDER_SITE_OTHER): Payer: Medicare Other

## 2022-07-25 VITALS — BP 120/70 | HR 88 | Temp 98.6°F | Ht <= 58 in | Wt 80.0 lb

## 2022-07-25 DIAGNOSIS — Z0000 Encounter for general adult medical examination without abnormal findings: Secondary | ICD-10-CM

## 2022-07-25 DIAGNOSIS — E782 Mixed hyperlipidemia: Secondary | ICD-10-CM

## 2022-07-25 LAB — HGB A1C W/O EAG: Hgb A1c MFr Bld: 5.8 % — ABNORMAL HIGH (ref 4.8–5.6)

## 2022-07-25 LAB — CMP14+EGFR
ALT: 13 IU/L (ref 0–32)
AST: 22 IU/L (ref 0–40)
Albumin/Globulin Ratio: 2.2 (ref 1.2–2.2)
Albumin: 4.7 g/dL (ref 3.9–4.9)
Alkaline Phosphatase: 66 IU/L (ref 44–121)
BUN/Creatinine Ratio: 13 (ref 12–28)
BUN: 9 mg/dL (ref 8–27)
Bilirubin Total: 1 mg/dL (ref 0.0–1.2)
CO2: 27 mmol/L (ref 20–29)
Calcium: 9.5 mg/dL (ref 8.7–10.3)
Chloride: 99 mmol/L (ref 96–106)
Creatinine, Ser: 0.67 mg/dL (ref 0.57–1.00)
Globulin, Total: 2.1 g/dL (ref 1.5–4.5)
Glucose: 124 mg/dL — ABNORMAL HIGH (ref 70–99)
Potassium: 3.8 mmol/L (ref 3.5–5.2)
Sodium: 139 mmol/L (ref 134–144)
Total Protein: 6.8 g/dL (ref 6.0–8.5)
eGFR: 98 mL/min/{1.73_m2} (ref >59)

## 2022-07-25 LAB — LIPID PANEL
Chol/HDL Ratio: 3.6 ratio (ref 0.0–4.4)
Cholesterol, Total: 263 mg/dL — ABNORMAL HIGH (ref 100–199)
HDL: 73 mg/dL (ref >39)
LDL Chol Calc (NIH): 178 mg/dL — ABNORMAL HIGH (ref 0–99)
Triglycerides: 75 mg/dL (ref 0–149)
VLDL Cholesterol Cal: 12 mg/dL (ref 5–40)

## 2022-07-25 LAB — CBC
Hematocrit: 37 % (ref 34.0–46.6)
Hemoglobin: 12.3 g/dL (ref 11.1–15.9)
MCH: 29.3 pg (ref 26.6–33.0)
MCHC: 33.2 g/dL (ref 31.5–35.7)
MCV: 88 fL (ref 79–97)
Platelets: 290 10*3/uL (ref 150–450)
RBC: 4.2 x10E6/uL (ref 3.77–5.28)
RDW: 13.7 % (ref 11.7–15.4)
WBC: 4.9 10*3/uL (ref 3.4–10.8)

## 2022-07-25 LAB — SPECIMEN STATUS REPORT

## 2022-07-25 MED ORDER — ROSUVASTATIN CALCIUM 20 MG PO TABS: 20.0000 mg | ORAL_TABLET | Freq: Every day | ORAL | 1 refills | Status: DC

## 2022-07-25 NOTE — Progress Notes (Signed)
Subjective:   Stacey Brock is a 61 y.o. female who presents for Medicare Annual (Subsequent) preventive examination.  Review of Systems     Cardiac Risk Factors include: dyslipidemia     Objective:    Today's Vitals   07/25/22 1210  BP: 120/70  Pulse: 88  Temp: 98.6 F (37 C)  TempSrc: Oral  SpO2: 92%  Weight: 80 lb (36.3 kg)  Height: '4\' 9"'$  (1.448 m)   Body mass index is 17.31 kg/m.     07/25/2022   12:22 PM 01/17/2021    2:56 PM 09/15/2019    2:21 PM 05/30/2019    6:09 PM 09/09/2018    3:12 PM 07/12/2016    9:03 PM  Advanced Directives  Does Patient Have a Medical Advance Directive? No No No No No   Would patient like information on creating a medical advance directive? No - Patient declined  No - Patient declined No - Patient declined No - Patient declined      Information is confidential and restricted. Go to Review Flowsheets to unlock data.    Current Medications (verified) Outpatient Encounter Medications as of 07/25/2022  Medication Sig   albuterol (PROVENTIL) (2.5 MG/3ML) 0.083% nebulizer solution Take 3 mLs (2.5 mg total) by nebulization every 6 (six) hours as needed for wheezing or shortness of breath.   albuterol (VENTOLIN HFA) 108 (90 Base) MCG/ACT inhaler INHALE 2 PUFFS INTO THE LUNGS EVERY 4 HOURS AS NEEDED FOR WHEEZING   loratadine (CLARITIN) 10 MG tablet Take 1 tablet (10 mg total) by mouth at bedtime.   montelukast (SINGULAIR) 10 MG tablet Take 1 tablet (10 mg total) by mouth at bedtime.   rosuvastatin (CRESTOR) 20 MG tablet Take 1 tablet (20 mg total) by mouth daily at 6 PM.   SYMBICORT 160-4.5 MCG/ACT inhaler Inhale 2 puffs into the lungs 2 (two) times daily.   [DISCONTINUED] rosuvastatin (CRESTOR) 10 MG tablet Take 1 tablet (10 mg total) by mouth daily at 6 PM.   No facility-administered encounter medications on file as of 07/25/2022.    Allergies (verified) Aspirin and Ibuprofen   History: Past Medical History:  Diagnosis Date    Allergy    Anxiety    OCC   Asthma    pt states she has usewd 02 at home in thepast  but NO use in the last year or more as she uses her MDI's daily   Myocardial infarction [facility])    [id number] per pt   Non-ST elevation (NSTEMI) myocardial infarction ([place]) 06/01/2019   Sinus trouble    Past Surgical History:  Procedure Laterality Date   FRACTURE SURGERY Left 05/2019   left arm broken   LEFT HEART CATH AND CORONARY ANGIOGRAPHY N/A 06/01/2019   Procedure: LEFT HEART CATH AND CORONARY ANGIOGRAPHY;  Surgeon: Belva Crome, MD;  Location: [facility];  Service: Cardiovascular;  Laterality: N/A;   Family History  Problem Relation Age of Onset   Colon cancer Father    Cancer Father        colon   Asthma Sister    Colon polyps Neg Hx    Esophageal cancer Neg Hx    Stomach cancer Neg Hx    Rectal cancer Neg Hx    Social History   Socioeconomic History   Marital status: Single    Spouse name: Not on file   Number of children: 0   Years of education: Not on file   Highest education level: Not on file  Occupational History   Occupation: disabled  Tobacco Use   Smoking status: Never   Smokeless tobacco: Never  Vaping Use   Vaping Use: Never used  Substance and Sexual Activity   Alcohol use: No   Drug use: No   Sexual activity: Not Currently  Other Topics Concern   Not on file  Social History Narrative   Not on file   Social Determinants of Health   Financial Resource Strain: Low Risk  (07/25/2022)   Overall Financial Resource Strain (CARDIA)    Difficulty of Paying Living Expenses: Not hard at all  Food Insecurity: No Food Insecurity (07/25/2022)   Hunger Vital Sign    Worried About Running Out of Food in the Last Year: Never true    Ran Out of Food in the Last Year: Never true  Transportation Needs: No Transportation Needs (07/25/2022)   PRAPARE - Hydrologist (Medical): No    Lack of Transportation (Non-Medical): No  Physical  Activity: Inactive (07/25/2022)   Exercise Vital Sign    Days of Exercise per Week: 0 days    Minutes of Exercise per Session: 0 min  Stress: Stress Concern Present (07/25/2022)   Post    Feeling of Stress : To some extent  Social Connections: Not on file    Tobacco Counseling Counseling given: Not Answered   Clinical Intake:  Pre-visit preparation completed: Yes  Pain : No/denies pain     Nutritional Status: BMI <19  Underweight Nutritional Risks: None Diabetes: No  How often do you need to have someone help you when you read instructions, pamphlets, or other written materials from your doctor or pharmacy?: 1 - Never What is the last grade level you completed in school?: 12th grade  Diabetic? no  Interpreter Needed?: No  Information entered by :: NAllen LPN   Activities of Daily Living    07/25/2022   12:24 PM  In your present state of health, do you have any difficulty performing the following activities:  Hearing? 1  Vision? 1  Comment sees yellow spots and some blurriness  Difficulty concentrating or making decisions? 0  Walking or climbing stairs? 0  Dressing or bathing? 0  Doing errands, shopping? 0  Preparing Food and eating ? N  Using the Toilet? N  In the past six months, have you accidently leaked urine? N  Do you have problems with loss of bowel control? N  Managing your Medications? N  Managing your Finances? N  Housekeeping or managing your Housekeeping? N    Patient Care Team: Minette Brine, FNP as PCP - General (General Practice)  Indicate any recent Medical Services you may have received from other than [facility] providers in the past year (date may be approximate).     Assessment:   This is a routine wellness examination for Stacey Brock.  Hearing/Vision screen Vision Screening - Comments:: No regular eye exams  Dietary issues and exercise activities discussed: Current  Exercise Habits: The patient does not participate in regular exercise at present   Goals Addressed             This Visit's Progress    Patient Stated       07/25/2022, wants start eating healthier       Depression Screen    07/25/2022   12:24 PM 01/23/2022    3:40 PM 01/17/2021    2:58 PM 01/17/2021    2:17 PM 01/17/2020  2:22 PM 09/15/2019    2:22 PM 03/11/2019    3:15 PM  PHQ 2/9 Scores  PHQ - 2 Score 0 1 0 0 0 0 0  PHQ- 9 Score  1    3     Fall Risk    07/25/2022   12:24 PM 07/24/2022    2:18 PM 01/17/2021    2:58 PM 01/17/2020    2:22 PM 09/15/2019    2:21 PM  [place] in the past year? 0 1 0 0 0  Number falls in past yr: 0 0     Injury with Fall? 0 0     Risk for fall due to : Medication side effect No Fall Risks Medication side effect    Follow up Falls prevention discussed;Education provided;Falls evaluation completed Falls evaluation completed Falls evaluation completed;Education provided;Falls prevention discussed  Falls evaluation completed;Falls prevention discussed;Education provided    FALL RISK PREVENTION PERTAINING TO THE HOME:  Any stairs in or around the home? No  If so, are there any without handrails? N/a Home free of loose throw rugs in walkways, pet beds, electrical cords, etc? Yes  Adequate lighting in your home to reduce risk of falls? Yes   ASSISTIVE DEVICES UTILIZED TO PREVENT FALLS:  Life alert? No  Use of a cane, walker or w/c? No  Grab bars in the bathroom? No  Shower chair or bench in shower? No  Elevated toilet seat or a handicapped toilet? No   TIMED UP AND GO:  Was the test performed? Yes .  Length of time to ambulate 10 feet: 5 sec.   Gait steady and fast without use of assistive device  Cognitive Function:        07/25/2022   12:26 PM 01/23/2022    3:15 PM 01/17/2021    2:59 PM 09/15/2019    2:25 PM 09/09/2018    3:16 PM  6CIT Screen  What Year? 0 points 0 points 0 points 0 points 0 points  What month? 0  points 0 points 0 points 0 points 0 points  What time? 0 points 0 points 0 points 0 points 0 points  Count back from 20 0 points 0 points 0 points 0 points 0 points  Months in reverse 0 points 0 points 0 points 0 points 0 points  Repeat phrase 10 points 4 points 8 points 4 points 2 points  Total Score 10 points 4 points 8 points 4 points 2 points    Immunizations Immunization History  Administered Date(s) Administered   Influenza,inj,Quad PF,6+ Mos 06/09/2013, 06/16/2018, 09/15/2019, 07/19/2020, 07/23/2021, 07/24/2022   PFIZER(Purple Top)SARS-COV-2 Vaccination 05/13/2020, 06/05/2020, 11/29/2020   Pneumococcal Polysaccharide-23 06/09/2013   Tdap 07/24/2022    TDAP status: Up to date  Flu Vaccine status: Up to date  Pneumococcal vaccine status: Up to date  Covid-19 vaccine status: Completed vaccines  Qualifies for Shingles Vaccine? Yes   Zostavax completed No   Shingrix Completed?: No.    Education has been provided regarding the importance of this vaccine. Patient has been advised to call insurance company to determine out of pocket expense if they have not yet received this vaccine. Advised may also receive vaccine at local pharmacy or Health Dept. Verbalized acceptance and understanding.  Screening Tests Health Maintenance  Topic Date Due   Zoster Vaccines- Shingrix (1 of 2) Never done   MAMMOGRAM  Never done   COVID-19 Vaccine (4 - Pfizer risk series) 01/24/2021   PAP SMEAR-Modifier  07/23/2024  COLONOSCOPY (Pts 45-25yr Insurance coverage will need to be confirmed)  10/03/2031   TETANUS/TDAP  07/24/2032   INFLUENZA VACCINE  Completed   Hepatitis C Screening  Completed   HIV Screening  Completed   HPV VACCINES  Aged Out    Health Maintenance  Health Maintenance Due  Topic Date Due   Zoster Vaccines- Shingrix (1 of 2) Never done   MAMMOGRAM  Never done   COVID-19 Vaccine (4 - Pfizer risk series) 01/24/2021    Colorectal cancer screening: Type of screening:  Colonoscopy. Completed 10/02/2021. Repeat every 10 years  Mammogram status: patient to schedule  Bone Density status: n/a  Lung Cancer Screening: (Low Dose CT Chest recommended if Age 63-80years, 30 pack-year currently smoking OR have quit w/in 15years.) does not qualify.   Lung Cancer Screening Referral: no  Additional Screening:  Hepatitis C Screening: does qualify; Completed 01/17/2020  Vision Screening: Recommended annual ophthalmology exams for early detection of glaucoma and other disorders of the eye. Is the patient up to date with their annual eye exam?  No  Who is the provider or what is the name of the office in which the patient attends annual eye exams? none If pt is not established with a provider, would they like to be referred to a provider to establish care? No .   Dental Screening: Recommended annual dental exams for proper oral hygiene  Community Resource Referral / Chronic Care Management: CRR required this visit?  No   CCM required this visit?  No      Plan:     I have personally reviewed and noted the following in the patient's chart:   Medical and social history Use of alcohol, tobacco or illicit drugs  Current medications and supplements including opioid prescriptions. Patient is not currently taking opioid prescriptions. Functional ability and status Nutritional status Physical activity Advanced directives List of other physicians Hospitalizations, surgeries, and ER visits in previous 12 months Vitals Screenings to include cognitive, depression, and falls Referrals and appointments  In addition, I have reviewed and discussed with patient certain preventive protocols, quality metrics, and best practice recommendations. A written personalized care plan for preventive services as well as general preventive health recommendations were provided to patient.     NKellie Simmering LPN   [id number]  Nurse Notes: none

## 2022-07-25 NOTE — Patient Instructions (Signed)
[name] , Thank you for taking time to come for your Medicare Wellness Visit. I appreciate your ongoing commitment to your health goals. Please review the following plan we discussed and let me know if I can assist you in the future.   Screening recommendations/referrals: Colonoscopy: Completed 10/02/2021 Mammogram: patient to schedule Bone Density: n/a Recommended yearly ophthalmology/optometry visit for glaucoma screening and checkup Recommended yearly dental visit for hygiene and checkup  Vaccinations: Influenza vaccine: 07/24/2022 Pneumococcal vaccine: n/a Tdap vaccine: completed 07/24/2022, due 07/24/2032 Shingles vaccine: 08/08/2022 at 2:00 Covid-19:  11/29/2020, 06/05/2020, 05/13/2020  Advanced directives: Advance directive discussed with you today. Even though you declined this today please call our office should you change your mind and we can give you the proper paperwork for you to fill out.   Conditions/risks identified: none  Next appointment: Follow up in one year for your annual wellness visit.   Preventive Care 40-64 Years, Female Preventive care refers to lifestyle choices and visits with your health care provider that can promote health and wellness. What does preventive care include? A yearly physical exam. This is also called an annual well check. Dental exams once or twice a year. Routine eye exams. Ask your health care provider how often you should have your eyes checked. Personal lifestyle choices, including: Daily care of your teeth and gums. Regular physical activity. Eating a healthy diet. Avoiding tobacco and drug use. Limiting alcohol use. Practicing safe sex. Taking low-dose aspirin daily starting at age 50. Taking vitamin and mineral supplements as recommended by your health care provider. What happens during an annual well check? The services and screenings done by your health care provider during your annual well check will depend on your age,  overall health, lifestyle risk factors, and family history of disease. Counseling  Your health care provider may ask you questions about your: Alcohol use. Tobacco use. Drug use. Emotional well-being. Home and relationship well-being. Sexual activity. Eating habits. Work and work environment. Method of birth control. Menstrual cycle. Pregnancy history. Screening  You may have the following tests or measurements: Height, weight, and BMI. Blood pressure. Lipid and cholesterol levels. These may be checked every 5 years, or more frequently if you are over 50 years old. Skin check. Lung cancer screening. You may have this screening every year starting at age 55 if you have a 30-pack-year history of smoking and currently smoke or have quit within the past 15 years. Fecal occult blood test (FOBT) of the stool. You may have this test every year starting at age 50. Flexible sigmoidoscopy or colonoscopy. You may have a sigmoidoscopy every 5 years or a colonoscopy every 10 years starting at age 50. Hepatitis C blood test. Hepatitis B blood test. Sexually transmitted disease (STD) testing. Diabetes screening. This is done by checking your blood sugar (glucose) after you have not eaten for a while (fasting). You may have this done every 1-3 years. Mammogram. This may be done every 1-2 years. Talk to your health care provider about when you should start having regular mammograms. This may depend on whether you have a family history of breast cancer. BRCA-related cancer screening. This may be done if you have a family history of breast, ovarian, tubal, or peritoneal cancers. Pelvic exam and Pap test. This may be done every 3 years starting at age 21. Starting at age 30, this may be done every 5 years if you have a Pap test in combination with an HPV test. Bone density scan. This is done   to screen for osteoporosis. You may have this scan if you are at high risk for osteoporosis. Discuss your test  results, treatment options, and if necessary, the need for more tests with your health care provider. Vaccines  Your health care provider may recommend certain vaccines, such as: Influenza vaccine. This is recommended every year. Tetanus, diphtheria, and acellular pertussis (Tdap, Td) vaccine. You may need a Td booster every 10 years. Zoster vaccine. You may need this after age 76. Pneumococcal 13-valent conjugate (PCV13) vaccine. You may need this if you have certain conditions and were not previously vaccinated. Pneumococcal polysaccharide (PPSV23) vaccine. You may need one or two doses if you smoke cigarettes or if you have certain conditions. Talk to your health care provider about which screenings and vaccines you need and how often you need them. This information is not intended to replace advice given to you by your health care provider. Make sure you discuss any questions you have with your health care provider. Document Released: 10/20/2015 Document Revised: 06/12/2016 Document Reviewed: 07/25/2015 Elsevier Interactive Patient Education  2017 [place] Prevention in the Home Falls can cause injuries. They can happen to people of all ages. There are many things you can do to make your home safe and to help prevent falls. What can I do on the outside of my home? Regularly fix the edges of walkways and driveways and fix any cracks. Remove anything that might make you trip as you walk through a door, such as a raised step or threshold. Trim any bushes or trees on the path to your home. Use bright outdoor lighting. Clear any walking paths of anything that might make someone trip, such as rocks or tools. Regularly check to see if handrails are loose or broken. Make sure that both sides of any steps have handrails. Any raised decks and porches should have guardrails on the edges. Have any leaves, snow, or ice cleared regularly. Use sand or salt on walking paths during  winter. Clean up any spills in your garage right away. This includes oil or grease spills. What can I do in the bathroom? Use night lights. Install grab bars by the toilet and in the tub and shower. Do not use towel bars as grab bars. Use non-skid mats or decals in the tub or shower. If you need to sit down in the shower, use a plastic, non-slip stool. Keep the floor dry. Clean up any water that spills on the floor as soon as it happens. Remove soap buildup in the tub or shower regularly. Attach bath mats securely with double-sided non-slip rug tape. Do not have throw rugs and other things on the floor that can make you trip. What can I do in the bedroom? Use night lights. Make sure that you have a light by your bed that is easy to reach. Do not use any sheets or blankets that are too big for your bed. They should not hang down onto the floor. Have a firm chair that has side arms. You can use this for support while you get dressed. Do not have throw rugs and other things on the floor that can make you trip. What can I do in the kitchen? Clean up any spills right away. Avoid walking on wet floors. Keep items that you use a lot in easy-to-reach places. If you need to reach something above you, use a strong step stool that has a grab bar. Keep electrical cords out of the  way. Do not use floor polish or wax that makes floors slippery. If you must use wax, use non-skid floor wax. Do not have throw rugs and other things on the floor that can make you trip. What can I do with my stairs? Do not leave any items on the stairs. Make sure that there are handrails on both sides of the stairs and use them. Fix handrails that are broken or loose. Make sure that handrails are as long as the stairways. Check any carpeting to make sure that it is firmly attached to the stairs. Fix any carpet that is loose or worn. Avoid having throw rugs at the top or bottom of the stairs. If you do have throw rugs,  attach them to the floor with carpet tape. Make sure that you have a light switch at the top of the stairs and the bottom of the stairs. If you do not have them, ask someone to add them for you. What else can I do to help prevent falls? Wear shoes that: Do not have high heels. Have rubber bottoms. Are comfortable and fit you well. Are closed at the toe. Do not wear sandals. If you use a stepladder: Make sure that it is fully opened. Do not climb a closed stepladder. Make sure that both sides of the stepladder are locked into place. Ask someone to hold it for you, if possible. Clearly mark and make sure that you can see: Any grab bars or handrails. First and last steps. Where the edge of each step is. Use tools that help you move around (mobility aids) if they are needed. These include: Canes. Walkers. Scooters. Crutches. Turn on the lights when you go into a dark area. Replace any light bulbs as soon as they burn out. Set up your furniture so you have a clear path. Avoid moving your furniture around. If any of your floors are uneven, fix them. If there are any pets around you, be aware of where they are. Review your medicines with your doctor. Some medicines can make you feel dizzy. This can increase your chance of falling. Ask your doctor what other things that you can do to help prevent falls. This information is not intended to replace advice given to you by your health care provider. Make sure you discuss any questions you have with your health care provider. Document Released: 07/20/2009 Document Revised: 02/29/2016 Document Reviewed: 10/28/2014 Elsevier Interactive Patient Education  2017 [employer].

## 2022-08-08 ENCOUNTER — Ambulatory Visit (INDEPENDENT_AMBULATORY_CARE_PROVIDER_SITE_OTHER): Payer: Medicare Other

## 2022-08-08 VITALS — BP 108/80 | HR 88 | Temp 98.1°F | Ht <= 58 in | Wt 80.0 lb

## 2022-08-08 DIAGNOSIS — Z23 Encounter for immunization: Secondary | ICD-10-CM

## 2022-08-08 NOTE — Patient Instructions (Signed)
Zoster Vaccine Injection What is this medication? ZOSTER VACCINE (ZOS ter vak SEEN) reduces the risk of herpes zoster (shingles). It does not treat shingles. It is still possible to get shingles after receiving the vaccine, but the symptoms may be less severe or not last as long. It works by helping your immune system learn how to fight off a future infection. This medicine may be used for other purposes; ask your health care provider or pharmacist if you have questions. COMMON BRAND NAME(S): SHINGRIX What should I tell my care team before I take this medication? They need to know if you have any of these conditions: Cancer Immune system problems An unusual or allergic reaction to Zoster vaccine, other medications, foods, dyes, or preservatives Pregnant or trying to get pregnant Breastfeeding How should I use this medication? This vaccine is injected into a muscle. It is given by your care team. This vaccine requires 2 doses to get the full benefit. Set a reminder for when your next dose is due. A copy of Vaccine Information Statements will be given before each vaccination. Be sure to read this information carefully each time. This sheet may change often. Talk to your care team about the use of this vaccine in children. This vaccine is not approved for use in children. Overdosage: If you think you have taken too much of this medicine contact a poison control center or emergency room at once. NOTE: This medicine is only for you. Do not share this medicine with others. What if I miss a dose? Keep appointments for follow-up (booster) doses. It is important not to miss your dose. Call your care team if you are unable to keep an appointment. What may interact with this medication? Medications that suppress your immune system Medications to treat cancer Steroid medications, such as prednisone or cortisone This list may not describe all possible interactions. Give your health care provider a list  of all the medicines, herbs, non-prescription drugs, or dietary supplements you use. Also tell them if you smoke, drink alcohol, or use illegal drugs. Some items may interact with your medicine. What should I watch for while using this medication? Visit your care team regularly. This vaccine, like all vaccines, may not fully protect everyone. What side effects may I notice from receiving this medication? Side effects that you should report to your care team as soon as possible: Allergic reactions--skin rash, itching, hives, swelling of the face, lips, tongue, or throat Side effects that usually do not require medical attention (report these to your care team if they continue or are bothersome): Chills Fatigue Feeling faint or lightheaded Fever Headache Muscle pain Pain, redness, or irritation at injection site This list may not describe all possible side effects. Call your doctor for medical advice about side effects. You may report side effects to FDA at 1-800-FDA-1088. Where should I keep my medication? This vaccine is only given by your care team. It will not be stored at home. NOTE: This sheet is a summary. It may not cover all possible information. If you have questions about this medicine, talk to your doctor, pharmacist, or health care provider.  2023 Elsevier/Gold Standard (2022-03-06 00:00:00)  

## 2022-08-08 NOTE — Progress Notes (Signed)
Patient here today for first shingrix injection.

## 2022-08-18 DIAGNOSIS — J45909 Unspecified asthma, uncomplicated: Secondary | ICD-10-CM | POA: Diagnosis not present

## 2022-09-17 DIAGNOSIS — J45909 Unspecified asthma, uncomplicated: Secondary | ICD-10-CM | POA: Diagnosis not present

## 2022-10-08 ENCOUNTER — Ambulatory Visit: Payer: Medicare Other

## 2022-10-18 DIAGNOSIS — J45909 Unspecified asthma, uncomplicated: Secondary | ICD-10-CM | POA: Diagnosis not present

## 2022-11-07 DIAGNOSIS — J45909 Unspecified asthma, uncomplicated: Secondary | ICD-10-CM | POA: Diagnosis not present

## 2022-11-14 ENCOUNTER — Ambulatory Visit (INDEPENDENT_AMBULATORY_CARE_PROVIDER_SITE_OTHER): Payer: 59 | Admitting: Nurse Practitioner

## 2022-11-14 ENCOUNTER — Encounter: Payer: Self-pay | Admitting: Nurse Practitioner

## 2022-11-14 VITALS — BP 130/70 | HR 129 | Temp 98.4°F | Ht <= 58 in | Wt 80.0 lb

## 2022-11-14 DIAGNOSIS — R7309 Other abnormal glucose: Secondary | ICD-10-CM

## 2022-11-14 DIAGNOSIS — I700 Atherosclerosis of aorta: Secondary | ICD-10-CM | POA: Diagnosis not present

## 2022-11-14 DIAGNOSIS — Z5982 Transportation insecurity: Secondary | ICD-10-CM

## 2022-11-14 DIAGNOSIS — J4520 Mild intermittent asthma, uncomplicated: Secondary | ICD-10-CM

## 2022-11-14 DIAGNOSIS — M546 Pain in thoracic spine: Secondary | ICD-10-CM

## 2022-11-14 DIAGNOSIS — M545 Low back pain, unspecified: Secondary | ICD-10-CM

## 2022-11-14 DIAGNOSIS — E46 Unspecified protein-calorie malnutrition: Secondary | ICD-10-CM

## 2022-11-14 DIAGNOSIS — G8929 Other chronic pain: Secondary | ICD-10-CM

## 2022-11-14 DIAGNOSIS — Z23 Encounter for immunization: Secondary | ICD-10-CM

## 2022-11-14 DIAGNOSIS — E782 Mixed hyperlipidemia: Secondary | ICD-10-CM

## 2022-11-14 DIAGNOSIS — Z1231 Encounter for screening mammogram for malignant neoplasm of breast: Secondary | ICD-10-CM

## 2022-11-14 MED ORDER — MONTELUKAST SODIUM 10 MG PO TABS: 10.0000 mg | ORAL_TABLET | Freq: Every day | ORAL | 1 refills | Status: DC

## 2022-11-14 NOTE — Progress Notes (Signed)
I,Sheena H Holbrook,acting as a Education administrator for Minette Brine, FNP.,have documented all relevant documentation on the behalf of Minette Brine, FNP,as directed by  Minette Brine, FNP while in the presence of Minette Brine, [place].    Subjective:     Patient ID: Stacey Brock , female    DOB: [birth year] , 62 y.o.   MRN: [id number]   Chief Complaint  Patient presents with   Immunizations    2nd Shingrix   Follow-up    Pre-diabetes and HLD   Pre-Diabetes    Elevated Hgb A1C    HPI  Patient presents today for follow up of pre-diabetes, HLD and Shingrix immunization. Patient also has increased LBP, reports fall about a year ago. Patient also reports increased stress due to home living situation.   Wt Readings from Last 3 Encounters: 11/14/22 : 80 lb (36.3 kg) 08/08/22 : 80 lb (36.3 kg) 07/25/22 : 80 lb (36.3 kg)       Past Medical History:  Diagnosis Date   Allergy    Anxiety    OCC   Asthma    pt states she has usewd 02 at home in thepast  but NO use in the last year or more as she uses her MDI's daily   Myocardial infarction [facility])    [id number] per pt   Non-ST elevation (NSTEMI) myocardial infarction ([place]) 06/01/2019   Sinus trouble      Family History  Problem Relation Age of Onset   Colon cancer Father    Cancer Father        colon   Asthma Sister    Colon polyps Neg Hx    Esophageal cancer Neg Hx    Stomach cancer Neg Hx    Rectal cancer Neg Hx      Current Outpatient Medications:    albuterol (PROVENTIL) (2.5 MG/3ML) 0.083% nebulizer solution, Take 3 mLs (2.5 mg total) by nebulization every 6 (six) hours as needed for wheezing or shortness of breath., Disp: 75 mL, Rfl: 12   albuterol (VENTOLIN HFA) 108 (90 Base) MCG/ACT inhaler, INHALE 2 PUFFS INTO THE LUNGS EVERY 4 HOURS AS NEEDED FOR WHEEZING, Disp: 18 g, Rfl: 3   loratadine (CLARITIN) 10 MG tablet, Take 1 tablet (10 mg total) by mouth at bedtime., Disp: 90 tablet, Rfl: 1   rosuvastatin (CRESTOR) 20 MG tablet, Take  1 tablet (20 mg total) by mouth daily at 6 PM., Disp: 90 tablet, Rfl: 1   SYMBICORT 160-4.5 MCG/ACT inhaler, Inhale 2 puffs into the lungs 2 (two) times daily., Disp: 10.2 g, Rfl: 6   montelukast (SINGULAIR) 10 MG tablet, Take 1 tablet (10 mg total) by mouth at bedtime., Disp: 90 tablet, Rfl: 1   Allergies  Allergen Reactions   Aspirin Other (See Comments)    Pt cannot specify reaction type and unsure if true allergy, but she states " I almost died after I had Aspirin, my heart stopped on operating table".    Ibuprofen Shortness Of Breath    Unsure if this is a true allergy. Patient presents with sweating, tachy, dizziness, flushing     Review of Systems  Musculoskeletal:  Positive for back pain.  All other systems reviewed and are negative.    Today's Vitals   11/14/22 1455  BP: 130/70  Pulse: (!) 129  Temp: 98.4 F (36.9 C)  TempSrc: Oral  SpO2: 97%  Weight: 80 lb (36.3 kg)  Height: 4' 9"$  (1.448 m)   Body mass index is 17.31 kg/m.  Objective:  Physical Exam Vitals reviewed.  Constitutional:      General: She is not in acute distress.    Appearance: Normal appearance.  Cardiovascular:     Rate and Rhythm: Normal rate and regular rhythm.     Pulses: Normal pulses.     Heart sounds: Normal heart sounds. No murmur heard. Pulmonary:     Effort: Pulmonary effort is normal. No respiratory distress.     Breath sounds: No wheezing.  Musculoskeletal:     Thoracic back: Scoliosis present.  Neurological:     Mental Status: She is alert.         Assessment And Plan:     1. Mild intermittent asthma without complication Comments: Stable, no current issues or exacerbation. Refilled her montelukast - montelukast (SINGULAIR) 10 MG tablet; Take 1 tablet (10 mg total) by mouth at bedtime.  Dispense: 90 tablet; Refill: 1 - AMB Referral to [place] ([facility] Patients)  2. Mixed hyperlipidemia Comments: Will check lipid panel, diet controlled. Continue focusing on  low fat diet - AMB Referral to [place] ([facility] Patients)  3. Abnormal glucose Comments: HgbA1c is stable. - Hemoglobin A1c - AMB Referral to [place] ([facility] Patients)  4. Atherosclerosis of aorta (HCC) Comments: Continue statin, tolerating well  5. Chronic bilateral thoracic back pain Comments: Will refer to Orthopedics for further evaluation. She has signifcant curvature - Ambulatory referral to Orthopedic Surgery  6. Chronic bilateral low back pain without sciatica Comments: Will refer to Orthopedics for further evaluation - Ambulatory referral to Orthopedic Surgery  7. Need for shingles vaccine Comments: Shingrix #2 given in office, TransRx done - Zoster Recombinant (Shingrix )  8. Transportation insecurity Comments: Referral made to [facility] for assistance, car needs alot of work.  9. Encounter for screening mammogram for breast cancer - MM Digital Screening; Future  10. Protein malnutrition (HCC) Comments: Albumin is in normal range. Her weight is under weight but stable.     Patient was given opportunity to ask questions. Patient verbalized understanding of the plan and was able to repeat key elements of the plan. All questions were answered to their satisfaction.  Minette Brine, FNP   I, Minette Brine, FNP, have reviewed all documentation for this visit. The documentation on 11/14/22 for the exam, diagnosis, procedures, and orders are all accurate and complete.   IF YOU HAVE BEEN REFERRED TO A SPECIALIST, IT MAY TAKE 1-2 WEEKS TO SCHEDULE/PROCESS THE REFERRAL. IF YOU HAVE NOT HEARD FROM US/SPECIALIST IN TWO WEEKS, PLEASE GIVE [place] A CALL AT [phone].   THE PATIENT IS ENCOURAGED TO PRACTICE SOCIAL DISTANCING DUE TO THE COVID-19 PANDEMIC.

## 2022-11-14 NOTE — Patient Instructions (Signed)
Zoster Vaccine Injection What is this medication? ZOSTER VACCINE (ZOS ter vak SEEN) reduces the risk of herpes zoster (shingles). It does not treat shingles. It is still possible to get shingles after receiving the vaccine, but the symptoms may be less severe or not last as long. It works by helping your immune system learn how to fight off a future infection. This medicine may be used for other purposes; ask your health care provider or pharmacist if you have questions. COMMON BRAND NAME(S): [facility] What should I tell my care team before I take this medication? They need to know if you have any of these conditions: Cancer Immune system problems An unusual or allergic reaction to Zoster vaccine, other medications, foods, dyes, or preservatives Pregnant or trying to get pregnant Breastfeeding How should I use this medication? This vaccine is injected into a muscle. It is given by your care team. This vaccine requires 2 doses to get the full benefit. Set a reminder for when your next dose is due. A copy of Vaccine Information Statements will be given before each vaccination. Be sure to read this information carefully each time. This sheet may change often. Talk to your care team about the use of this vaccine in children. This vaccine is not approved for use in children. Overdosage: If you think you have taken too much of this medicine contact a poison control center or emergency room at once. NOTE: This medicine is only for you. Do not share this medicine with others. What if I miss a dose? Keep appointments for follow-up (booster) doses. It is important not to miss your dose. Call your care team if you are unable to keep an appointment. What may interact with this medication? Medications that suppress your immune system Medications to treat cancer Steroid medications, such as prednisone or cortisone This list may not describe all possible interactions. Give your health care provider a list  of all the medicines, herbs, non-prescription drugs, or dietary supplements you use. Also tell them if you smoke, drink alcohol, or use illegal drugs. Some items may interact with your medicine. What should I watch for while using this medication? Visit your care team regularly. This vaccine, like all vaccines, may not fully protect everyone. What side effects may I notice from receiving this medication? Side effects that you should report to your care team as soon as possible: Allergic reactions--skin rash, itching, hives, swelling of the face, lips, tongue, or throat Side effects that usually do not require medical attention (report these to your care team if they continue or are bothersome): Chills Fatigue Feeling faint or lightheaded Fever Headache Muscle pain Pain, redness, or irritation at injection site This list may not describe all possible side effects. Call your doctor for medical advice about side effects. You may report side effects to FDA at 1-800-FDA-1088. Where should I keep my medication? This vaccine is only given by your care team. It will not be stored at home. NOTE: This sheet is a summary. It may not cover all possible information. If you have questions about this medicine, talk to your doctor, pharmacist, or health care provider.  2023 Elsevier/Gold Standard (2022-03-06 00:00:00)   High Cholesterol  High cholesterol is a condition in which the blood has high levels of a white, waxy substance similar to fat (cholesterol). The liver makes all the cholesterol that the body needs. The human body needs small amounts of cholesterol to help build cells. A person gets extra or excess cholesterol from the  food that he or she eats. The blood carries cholesterol from the liver to the rest of the body. If you have high cholesterol, deposits (plaques) may build up on the walls of your arteries. Arteries are the blood vessels that carry blood away from your heart. These plaques  make the arteries narrow and stiff. Cholesterol plaques increase your risk for heart attack and stroke. Work with your health care provider to keep your cholesterol levels in a healthy range. What increases the risk? The following factors may make you more likely to develop this condition: Eating foods that are high in animal fat (saturated fat) or cholesterol. Being overweight. Not getting enough exercise. A family history of high cholesterol (familial hypercholesterolemia). Use of tobacco products. Having diabetes. What are the signs or symptoms? In most cases, high cholesterol does not usually cause any symptoms. In severe cases, very high cholesterol levels can cause: Fatty bumps under the skin (xanthomas). A white or gray ring around the black center (pupil) of the eye. How is this diagnosed? This condition may be diagnosed based on the results of a blood test. If you are older than 64 years of age, your health care provider may check your cholesterol levels every 4-6 years. You may be checked more often if you have high cholesterol or other risk factors for heart disease. The blood test for cholesterol measures: "Bad" cholesterol, or LDL cholesterol. This is the main type of cholesterol that causes heart disease. The desired level is less than 100 mg/dL (2.59 mmol/L). "Good" cholesterol, or HDL cholesterol. HDL helps protect against heart disease by cleaning the arteries and carrying the LDL to the liver for processing. The desired level for HDL is 60 mg/dL (1.55 mmol/L) or higher. Triglycerides. These are fats that your body can store or burn for energy. The desired level is less than 150 mg/dL (1.69 mmol/L). Total cholesterol. This measures the total amount of cholesterol in your blood and includes LDL, HDL, and triglycerides. The desired level is less than 200 mg/dL (5.17 mmol/L). How is this treated? Treatment for high cholesterol starts with lifestyle changes, such as diet and  exercise. Diet changes. You may be asked to eat foods that have more fiber and less saturated fats or added sugar. Lifestyle changes. These may include regular exercise, maintaining a healthy weight, and quitting use of tobacco products. Medicines. These are given when diet and lifestyle changes have not worked. You may be prescribed a statin medicine to help lower your cholesterol levels. Follow these instructions at home: Eating and drinking  Eat a healthy, balanced diet. This diet includes: Daily servings of a variety of fresh, frozen, or canned fruits and vegetables. Daily servings of whole grain foods that are rich in fiber. Foods that are low in saturated fats and trans fats. These include poultry and fish without skin, lean cuts of meat, and low-fat dairy products. A variety of fish, especially oily fish that contain omega-3 fatty acids. Aim to eat fish at least 2 times a week. Avoid foods and drinks that have added sugar. Use healthy cooking methods, such as roasting, grilling, broiling, baking, poaching, steaming, and stir-frying. Do not fry your food except for stir-frying. If you drink alcohol: Limit how much you have to: 0-1 drink a day for women who are not pregnant. 0-2 drinks a day for men. Know how much alcohol is in a drink. In the [place] one drink equals one 12 oz bottle of beer (355 mL), one 5 oz glass of  wine (148 mL), or one 1 oz glass of hard liquor (44 mL). Lifestyle  Get regular exercise. Aim to exercise for a total of 150 minutes a week. Increase your activity level by doing activities such as gardening, walking, and taking the stairs. Do not use any products that contain nicotine or tobacco. These products include cigarettes, chewing tobacco, and vaping devices, such as e-cigarettes. If you need help quitting, ask your health care provider. General instructions Take over-the-counter and prescription medicines only as told by your health care provider. Keep all  follow-up visits. This is important. Where to find more information American Heart Association: www.heart.org National Heart, Lung, and Blood Institute: https://wilson-eaton.com/ Contact a health care provider if: You have trouble achieving or maintaining a healthy diet or weight. You are starting an exercise program. You are unable to stop smoking. Get help right away if: You have chest pain. You have trouble breathing. You have discomfort or pain in your jaw, neck, back, shoulder, or arm. You have any symptoms of a stroke. "BE FAST" is an easy way to remember the main warning signs of a stroke: B - Balance. Signs are dizziness, sudden trouble walking, or loss of balance. E - Eyes. Signs are trouble seeing or a sudden change in vision. F - Face. Signs are sudden weakness or numbness of the face, or the face or eyelid drooping on one side. A - Arms. Signs are weakness or numbness in an arm. This happens suddenly and usually on one side of the body. S - Speech. Signs are sudden trouble speaking, slurred speech, or trouble understanding what people say. T - Time. Time to call emergency services. Write down what time symptoms started. You have other signs of a stroke, such as: A sudden, severe headache with no known cause. Nausea or vomiting. Seizure. These symptoms may represent a serious problem that is an emergency. Do not wait to see if the symptoms will go away. Get medical help right away. Call your local emergency services (911 in the [place] Do not drive yourself to the hospital. Summary Cholesterol plaques increase your risk for heart attack and stroke. Work with your health care provider to keep your cholesterol levels in a healthy range. Eat a healthy, balanced diet, get regular exercise, and maintain a healthy weight. Do not use any products that contain nicotine or tobacco. These products include cigarettes, chewing tobacco, and vaping devices, such as e-cigarettes. Get help right away  if you have any symptoms of a stroke. This information is not intended to replace advice given to you by your health care provider. Make sure you discuss any questions you have with your health care provider. Document Revised: 04/26/2022 Document Reviewed: 11/27/2020 Elsevier Patient Education  [place] Malnutrition Protein-energy malnutrition is when a person does not eat enough protein, fat, and calories. When this happens over time, it can lead to severe loss of muscle tissue (muscle wasting). This condition also affects the body's defense system (immune system) and can lead to other health problems. What are the causes? This condition may be caused by: Not eating enough protein, fat, or calories. Having certain chronic medical conditions. Eating too little. What increases the risk? The following factors may make you more likely to develop this condition: Living in poverty. Long-term hospitalization. Alcohol or drug dependency. Addiction often leads to a lifestyle in which proper diet is ignored. Dependency can also hurt the metabolism and the body's ability to absorb nutrients. Eating disorders, such  as anorexia nervosa or bulimia. Chewing or swallowing problems. People with these disorders may not eat enough. Having certain conditions, such as: Inflammatory bowel disease. Inflammation of the intestines makes it difficult for the body to absorb nutrients. Cancer or AIDS. These diseases can cause a loss of appetite. Chronic heart failure. This interferes with how the body uses nutrients. Cystic fibrosis. This disease can make it difficult for the body to absorb nutrients. Eating a diet that extremely restricts protein, fat, or calorie intake. What are the signs or symptoms? Symptoms of this condition include: Tiredness (fatigue). Weakness. Dizziness. Fainting. Weight loss. Loss of muscle tone and muscle mass. Poor immune response. Lack of  menstruation. Poor memory. Hair loss. Skin changes. How is this diagnosed? This condition may be diagnosed based on: Your medical and dietary history. A physical exam. This may include a measurement of your body mass index. Blood tests. How is this treated? This condition may be managed with: Nutrition therapy. This may include working with a Microbiologist. Treatment for underlying conditions. People with severe protein-energy malnutrition may need to be treated in a hospital. This may involve receiving nutrition and fluids through an IV. Follow these instructions at home:  Eat a balanced diet. In each meal, include at least one food that is high in protein. Foods that are high in protein include: Meat. Poultry. Fish. Eggs. Cheese. Milk. Beans. Nuts. Eat nutrient-rich foods that are easy to swallow and digest, such as: Fruit and yogurt smoothies. Oatmeal with nut butter. Nutrition supplement drinks. Try to eat six small meals each day instead of three large meals. Take vitamin and protein supplements as told by your health care provider or dietitian. Follow your health care provider's recommendations about exercise and activity. Keep all follow-up visits. This is important. Contact a health care provider if: You have increased weakness or fatigue. You faint. You are a woman and you stop having your period (menstruating). You have rapid hair loss. You have unexpected weight loss. You have diarrhea. You have nausea and vomiting. Get help right away if: You have difficulty breathing. You have chest pain. These symptoms may represent a serious problem that is an emergency. Do not wait to see if the symptoms will go away. Get medical help right away. Call your local emergency services (911 in the [place] Do not drive yourself to the hospital. Summary Protein-energy malnutrition is when a person does not eat enough protein, fat, and calories. Protein-energy malnutrition can lead  to severe loss of muscle tissue (muscle wasting). This condition also affects the body's defense system (immune system) and can lead to other health problems. Talk with your health care provider about treatment for this condition. Effective treatment depends on the underlying cause of the malnutrition. This information is not intended to replace advice given to you by your health care provider. Make sure you discuss any questions you have with your health care provider. Document Revised: 09/23/2020 Document Reviewed: 09/23/2020 Elsevier Patient Education  [place].

## 2022-11-15 ENCOUNTER — Telehealth: Payer: Self-pay | Admitting: *Deleted

## 2022-11-15 LAB — HEMOGLOBIN A1C
Est. average glucose Bld gHb Est-mCnc: 117 mg/dL
Hgb A1c MFr Bld: 5.7 % — ABNORMAL HIGH (ref 4.8–5.6)

## 2022-11-15 NOTE — Progress Notes (Signed)
  Care Coordination  Outreach Note  11/15/2022 Name: Stacey Brock MRN: [id number] DOB: March 25, 1961   Care Coordination Outreach Attempts: An unsuccessful telephone outreach was attempted today to offer the patient information about available care coordination services as a benefit of their health plan.   Follow Up Plan:  Additional outreach attempts will be made to offer the patient care coordination information and services.   Encounter Outcome:  No Answer  [place]  Direct Dial: [phone]

## 2022-11-18 DIAGNOSIS — J45909 Unspecified asthma, uncomplicated: Secondary | ICD-10-CM | POA: Diagnosis not present

## 2022-11-18 NOTE — Progress Notes (Signed)
  Care Coordination  Outreach Note  11/18/2022 Name: Stacey Brock MRN: [id number] DOB: 1961-03-08   Care Coordination Outreach Attempts: A second unsuccessful outreach was attempted today to offer the patient with information about available care coordination services as a benefit of their health plan.     Follow Up Plan:  Additional outreach attempts will be made to offer the patient care coordination information and services.   Encounter Outcome:  No Answer  [place]  Direct Dial: [phone]

## 2022-11-20 DIAGNOSIS — M545 Low back pain, unspecified: Secondary | ICD-10-CM | POA: Insufficient documentation

## 2022-11-20 DIAGNOSIS — I700 Atherosclerosis of aorta: Secondary | ICD-10-CM | POA: Insufficient documentation

## 2022-11-20 DIAGNOSIS — E46 Unspecified protein-calorie malnutrition: Secondary | ICD-10-CM | POA: Insufficient documentation

## 2022-11-20 DIAGNOSIS — R7309 Other abnormal glucose: Secondary | ICD-10-CM | POA: Insufficient documentation

## 2022-11-20 DIAGNOSIS — G8929 Other chronic pain: Secondary | ICD-10-CM | POA: Insufficient documentation

## 2022-11-25 NOTE — Progress Notes (Addendum)
  Care Coordination  Outreach Note  11/25/2022 Name: Stacey Brock MRN: [id number] DOB: 12-26-1960   Care Coordination Outreach Attempts: A third unsuccessful outreach was attempted today to offer the patient with information about available care coordination services as a benefit of their health plan.   Referral Received   Follow Up Plan:  No further outreach attempts will be made at this time. We have been unable to contact the patient to offer or enroll patient in care coordination services  Encounter Outcome:  No Answer  [place]: [phone]

## 2022-12-04 ENCOUNTER — Telehealth: Payer: Self-pay | Admitting: Physical Medicine and Rehabilitation

## 2022-12-04 NOTE — Telephone Encounter (Signed)
Attempted to call patient, but voicemail not set up. Appointment cancelled until return call

## 2022-12-04 NOTE — Telephone Encounter (Signed)
Pt need to reschedule appt. Please call pt at [phone]

## 2022-12-05 ENCOUNTER — Ambulatory Visit: Payer: 59 | Admitting: Physical Medicine and Rehabilitation

## 2022-12-05 ENCOUNTER — Telehealth: Payer: Self-pay | Admitting: Physical Medicine and Rehabilitation

## 2022-12-05 NOTE — Telephone Encounter (Signed)
Spoke with patient and rescheduled for 12/16/22

## 2022-12-05 NOTE — Telephone Encounter (Signed)
Patient called. Would like to [facility] her appointment with [facility]. [phone]

## 2022-12-06 DIAGNOSIS — J45909 Unspecified asthma, uncomplicated: Secondary | ICD-10-CM | POA: Diagnosis not present

## 2022-12-16 ENCOUNTER — Telehealth: Payer: Self-pay | Admitting: Physical Medicine and Rehabilitation

## 2022-12-16 ENCOUNTER — Ambulatory Visit: Payer: 59 | Admitting: Physical Medicine and Rehabilitation

## 2022-12-16 NOTE — Telephone Encounter (Signed)
Called pt and was unable to leave vm.Phone went straight to vm. Vm not set upm to leave a message. Will try again later

## 2022-12-17 DIAGNOSIS — J45909 Unspecified asthma, uncomplicated: Secondary | ICD-10-CM | POA: Diagnosis not present

## 2022-12-25 ENCOUNTER — Other Ambulatory Visit (INDEPENDENT_AMBULATORY_CARE_PROVIDER_SITE_OTHER): Payer: 59

## 2022-12-25 ENCOUNTER — Ambulatory Visit (INDEPENDENT_AMBULATORY_CARE_PROVIDER_SITE_OTHER): Payer: 59 | Admitting: Physical Medicine and Rehabilitation

## 2022-12-25 ENCOUNTER — Encounter: Payer: Self-pay | Admitting: Physical Medicine and Rehabilitation

## 2022-12-25 DIAGNOSIS — G8929 Other chronic pain: Secondary | ICD-10-CM | POA: Diagnosis not present

## 2022-12-25 DIAGNOSIS — M5442 Lumbago with sciatica, left side: Secondary | ICD-10-CM | POA: Diagnosis not present

## 2022-12-25 DIAGNOSIS — M5416 Radiculopathy, lumbar region: Secondary | ICD-10-CM

## 2022-12-25 DIAGNOSIS — M546 Pain in thoracic spine: Secondary | ICD-10-CM | POA: Diagnosis not present

## 2022-12-25 DIAGNOSIS — M4126 Other idiopathic scoliosis, lumbar region: Secondary | ICD-10-CM | POA: Diagnosis not present

## 2022-12-25 DIAGNOSIS — M7918 Myalgia, other site: Secondary | ICD-10-CM
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
## 2022-12-25 NOTE — Progress Notes (Signed)
Functional Pain Scale - descriptive words and definitions  Distressing (6)    Pain is present/unable to complete most ADLs limited by pain/sleep is difficult and active distraction is only marginal. Moderate range order  Average Pain  varies  Middle and low back pain. Bending makes pain worse

## 2023-01-17 DIAGNOSIS — J45909 Unspecified asthma, uncomplicated: Secondary | ICD-10-CM | POA: Diagnosis not present

## 2023-01-20 ENCOUNTER — Ambulatory Visit
Admission: RE | Admit: 2023-01-20 | Discharge: 2023-01-20 | Disposition: A | Discharge status: Home / Self Care | Payer: 59 | Source: Ambulatory Visit | Attending: Physical Medicine and Rehabilitation | Admitting: Physical Medicine and Rehabilitation

## 2023-01-20 DIAGNOSIS — M5416 Radiculopathy, lumbar region: Secondary | ICD-10-CM

## 2023-01-20 DIAGNOSIS — M4126 Other idiopathic scoliosis, lumbar region: Secondary | ICD-10-CM | POA: Diagnosis not present

## 2023-01-20 DIAGNOSIS — M48061 Spinal stenosis, lumbar region without neurogenic claudication: Secondary | ICD-10-CM | POA: Diagnosis not present

## 2023-01-20 DIAGNOSIS — M545 Low back pain, unspecified: Secondary | ICD-10-CM | POA: Diagnosis not present

## 2023-01-20 DIAGNOSIS — M7918 Myalgia, other site: Secondary | ICD-10-CM

## 2023-01-20 DIAGNOSIS — G8929 Other chronic pain: Secondary | ICD-10-CM

## 2023-01-20 DIAGNOSIS — M5136 Other intervertebral disc degeneration, lumbar region: Secondary | ICD-10-CM | POA: Diagnosis not present

## 2023-01-23 ENCOUNTER — Telehealth: Payer: Self-pay | Admitting: Physical Medicine and Rehabilitation

## 2023-01-23 ENCOUNTER — Other Ambulatory Visit: Payer: Self-pay | Admitting: Physical Medicine and Rehabilitation

## 2023-01-23 DIAGNOSIS — M5416 Radiculopathy, lumbar region: Secondary | ICD-10-CM

## 2023-01-23 NOTE — Telephone Encounter (Signed)
Attempted to call patient this afternoon to discuss recent lumbar MRI imaging. No answer. I also called her cousin Fayrene Fearing, no answer. I placed order for lumbar epidural steroid injection, instructed patient to call us back.

## 2023-02-05 ENCOUNTER — Other Ambulatory Visit: Payer: Self-pay

## 2023-02-05 ENCOUNTER — Ambulatory Visit (INDEPENDENT_AMBULATORY_CARE_PROVIDER_SITE_OTHER): Payer: 59 | Admitting: Physical Medicine and Rehabilitation

## 2023-02-05 VITALS — BP 118/83 | HR 89

## 2023-02-05 DIAGNOSIS — M5416 Radiculopathy, lumbar region: Secondary | ICD-10-CM

## 2023-02-05 MED ORDER — METHYLPREDNISOLONE ACETATE 80 MG/ML IJ SUSP
80.0000 mg | Freq: Once | INTRAMUSCULAR | Status: AC
  Administered 2023-02-05: 80 mg

## 2023-02-05 NOTE — Patient Instructions (Signed)

## 2023-02-05 NOTE — Progress Notes (Signed)
Functional Pain Scale - descriptive words and definitions  Distressing (6)    Pain is present/unable to complete most ADLs limited by pain/sleep is difficult and active distraction is only marginal. Moderate range order  Average Pain  varies   +Driver, -BT, -Dye Allergies.  Lower back pain on left

## 2023-02-16 DIAGNOSIS — J45909 Unspecified asthma, uncomplicated: Secondary | ICD-10-CM | POA: Diagnosis not present

## 2023-02-18 NOTE — Progress Notes (Signed)
Stacey Brock - 62 y.o. female MRN [id number]  Date of birth: 02-Dec-1960  Office Visit Note: Visit Date: 02/05/2023 PCP: Arnette Felts, FNP Referred by: Arnette Felts, FNP  Subjective: Chief Complaint  Patient presents with   Lower Back - Pain   HPI:  Stacey Brock is a 62 y.o. female who comes in today at the request of Ellin Goodie, FNP for planned Left L3-4 Lumbar Interlaminar epidural steroid injection with fluoroscopic guidance.  The patient has failed conservative care including home exercise, medications, time and activity modification.  This injection will be diagnostic and hopefully therapeutic.  Please see requesting physician notes for further details and justification.   ROS Otherwise per HPI.  Assessment & Plan: Visit Diagnoses:    ICD-10-CM   1. Lumbar radiculopathy  M54.16 XR C-ARM NO REPORT    Epidural Steroid injection    methylPREDNISolone acetate (DEPO-MEDROL) injection 80 mg      Plan: No additional findings.   Meds & Orders:  Meds ordered this encounter  Medications   methylPREDNISolone acetate (DEPO-MEDROL) injection 80 mg    Orders Placed This Encounter  Procedures   XR C-ARM NO REPORT   Epidural Steroid injection    Follow-up: Return for visit to requesting provider as needed.   Procedures: No procedures performed  Lumbar Epidural Steroid Injection - Interlaminar Approach with Fluoroscopic Guidance  Patient: Stacey Brock      Date of Birth: Sep 02, 1961 MRN: [id number] PCP: Arnette Felts, FNP      Visit Date: 02/05/2023   Universal Protocol:     Consent Given By: the patient  Position: PRONE  Additional Comments: Vital signs were monitored before and after the procedure. Patient was prepped and draped in the usual sterile fashion. The correct patient, procedure, and site was verified.   Injection Procedure Details:   Procedure diagnoses: Lumbar radiculopathy [M54.16]   Meds Administered:  Meds ordered this encounter   Medications   methylPREDNISolone acetate (DEPO-MEDROL) injection 80 mg     Laterality: Left  Location/Site:  L3-4  Needle: 3.5 in., 20 ga. Tuohy  Needle Placement: Paramedian epidural  Findings:   -Comments: Excellent flow of contrast into the epidural space.  Procedure Details: Using a paramedian approach from the side mentioned above, the region overlying the inferior lamina was localized under fluoroscopic visualization and the soft tissues overlying this structure were infiltrated with 4 ml. of 1% Lidocaine without Epinephrine. The Tuohy needle was inserted into the epidural space using a paramedian approach.   The epidural space was localized using loss of resistance along with counter oblique bi-planar fluoroscopic views.  After negative aspirate for air, blood, and CSF, a 2 ml. volume of Isovue-250 was injected into the epidural space and the flow of contrast was observed. Radiographs were obtained for documentation purposes.    The injectate was administered into the level noted above.   Additional Comments:  The patient tolerated the procedure well Dressing: 2 x 2 sterile gauze and Band-Aid    Post-procedure details: Patient was observed during the procedure. Post-procedure instructions were reviewed.  Patient left the clinic in stable condition.   Clinical History: No specialty comments available.     Objective:  VS:  HT:    WT:   BMI:     BP:118/83  HR:89bpm  TEMP: ( )  RESP:  Physical Exam Vitals and nursing note reviewed.  Constitutional:      General: She is not in acute distress.    Appearance: Normal appearance.  She is not ill-appearing.  HENT:     Head: Normocephalic and atraumatic.     Right Ear: External ear normal.     Left Ear: External ear normal.  Eyes:     Extraocular Movements: Extraocular movements intact.  Cardiovascular:     Rate and Rhythm: Normal rate.     Pulses: Normal pulses.  Pulmonary:     Effort: Pulmonary effort is  normal. No respiratory distress.  Abdominal:     General: There is no distension.     Palpations: Abdomen is soft.  Musculoskeletal:        General: Tenderness present.     Cervical back: Neck supple.     Right lower leg: No edema.     Left lower leg: No edema.     Comments: Patient has good distal strength with no pain over the greater trochanters.  No clonus or focal weakness.  Skin:    Findings: No erythema, lesion or rash.  Neurological:     General: No focal deficit present.     Mental Status: She is alert and oriented to person, place, and time.     Sensory: No sensory deficit.     Motor: No weakness or abnormal muscle tone.     Coordination: Coordination normal.  Psychiatric:        Mood and Affect: Mood normal.        Behavior: Behavior normal.      Imaging: No results found.

## 2023-02-18 NOTE — Procedures (Signed)
Lumbar Epidural Steroid Injection - Interlaminar Approach with Fluoroscopic Guidance  Patient: Stacey Brock      Date of Birth: 09-Dec-1960 MRN: [id number] PCP: Arnette Felts, FNP      Visit Date: 02/05/2023   Universal Protocol:     Consent Given By: the patient  Position: PRONE  Additional Comments: Vital signs were monitored before and after the procedure. Patient was prepped and draped in the usual sterile fashion. The correct patient, procedure, and site was verified.   Injection Procedure Details:   Procedure diagnoses: Lumbar radiculopathy [M54.16]   Meds Administered:  Meds ordered this encounter  Medications   methylPREDNISolone acetate (DEPO-MEDROL) injection 80 mg     Laterality: Left  Location/Site:  L3-4  Needle: 3.5 in., 20 ga. Tuohy  Needle Placement: Paramedian epidural  Findings:   -Comments: Excellent flow of contrast into the epidural space.  Procedure Details: Using a paramedian approach from the side mentioned above, the region overlying the inferior lamina was localized under fluoroscopic visualization and the soft tissues overlying this structure were infiltrated with 4 ml. of 1% Lidocaine without Epinephrine. The Tuohy needle was inserted into the epidural space using a paramedian approach.   The epidural space was localized using loss of resistance along with counter oblique bi-planar fluoroscopic views.  After negative aspirate for air, blood, and CSF, a 2 ml. volume of Isovue-250 was injected into the epidural space and the flow of contrast was observed. Radiographs were obtained for documentation purposes.    The injectate was administered into the level noted above.   Additional Comments:  The patient tolerated the procedure well Dressing: 2 x 2 sterile gauze and Band-Aid    Post-procedure details: Patient was observed during the procedure. Post-procedure instructions were reviewed.  Patient left the clinic in stable condition.

## 2023-03-17 ENCOUNTER — Ambulatory Visit: Payer: 59 | Admitting: Nurse Practitioner

## 2023-03-19 DIAGNOSIS — J45909 Unspecified asthma, uncomplicated: Secondary | ICD-10-CM | POA: Diagnosis not present

## 2023-03-20 ENCOUNTER — Ambulatory Visit (INDEPENDENT_AMBULATORY_CARE_PROVIDER_SITE_OTHER): Payer: 59 | Admitting: Nurse Practitioner

## 2023-03-20 ENCOUNTER — Encounter: Payer: Self-pay | Admitting: Nurse Practitioner

## 2023-03-20 VITALS — BP 130/74 | HR 85 | Temp 98.4°F | Ht <= 58 in | Wt 86.0 lb

## 2023-03-20 DIAGNOSIS — J4520 Mild intermittent asthma, uncomplicated: Secondary | ICD-10-CM | POA: Diagnosis not present

## 2023-03-20 DIAGNOSIS — Z2821 Immunization not carried out because of patient refusal: Secondary | ICD-10-CM | POA: Diagnosis not present

## 2023-03-20 DIAGNOSIS — R7309 Other abnormal glucose: Secondary | ICD-10-CM | POA: Diagnosis not present

## 2023-03-20 DIAGNOSIS — E782 Mixed hyperlipidemia: Secondary | ICD-10-CM | POA: Diagnosis not present

## 2023-03-20 DIAGNOSIS — E559 Vitamin D deficiency, unspecified: Secondary | ICD-10-CM

## 2023-03-20 DIAGNOSIS — E46 Unspecified protein-calorie malnutrition: Secondary | ICD-10-CM | POA: Diagnosis not present

## 2023-03-20 MED ORDER — MONTELUKAST SODIUM 10 MG PO TABS: 10.0000 mg | ORAL_TABLET | Freq: Every day | ORAL | 1 refills | Status: DC

## 2023-03-20 MED ORDER — ALBUTEROL SULFATE HFA 108 (90 BASE) MCG/ACT IN AERS: INHALATION_SPRAY | RESPIRATORY_TRACT | 3 refills | Status: DC

## 2023-03-20 MED ORDER — SYMBICORT 160-4.5 MCG/ACT IN AERO
2.0000 | INHALATION_SPRAY | Freq: Two times a day (BID) | RESPIRATORY_TRACT | 6 refills | Status: DC
Start: 2023-03-20 — End: 2023-08-19
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
## 2023-03-21 LAB — HEMOGLOBIN A1C
Est. average glucose Bld gHb Est-mCnc: 126 mg/dL
Hgb A1c MFr Bld: 6 % — ABNORMAL HIGH (ref 4.8–5.6)

## 2023-03-21 LAB — LIPID PANEL
Chol/HDL Ratio: 3.2 ratio (ref 0.0–4.4)
Cholesterol, Total: 270 mg/dL — ABNORMAL HIGH (ref 100–199)
HDL: 84 mg/dL (ref >39)
LDL Chol Calc (NIH): 179 mg/dL — ABNORMAL HIGH (ref 0–99)
Triglycerides: 48 mg/dL (ref 0–149)
VLDL Cholesterol Cal: 7 mg/dL (ref 5–40)

## 2023-03-21 LAB — VITAMIN D 25 HYDROXY (VIT D DEFICIENCY, FRACTURES): Vit D, 25-Hydroxy: 27.6 ng/mL — ABNORMAL LOW (ref 30.0–100.0)

## 2023-04-02 DIAGNOSIS — E559 Vitamin D deficiency, unspecified: Secondary | ICD-10-CM | POA: Insufficient documentation

## 2023-04-02 MED ORDER — ROSUVASTATIN CALCIUM 20 MG PO TABS: 20.0000 mg | ORAL_TABLET | Freq: Every day | ORAL | 1 refills | Status: DC

## 2023-04-02 MED ORDER — VITAMIN D (ERGOCALCIFEROL) 1.25 MG (50000 UNIT) PO CAPS: 50000.0000 [IU] | ORAL_CAPSULE | ORAL | 1 refills | Status: DC

## 2023-04-04 ENCOUNTER — Telehealth: Payer: Self-pay

## 2023-04-04 NOTE — Progress Notes (Signed)
  Care Management   Outreach Note  04/04/2023 Name: Stacey Brock MRN: [id number] DOB: [birth year]  An unsuccessful telephone outreach was attempted today to contact the patient about Chronic Care Management needs.    Follow Up Plan:  The care management team will reach out to the patient again over the next 7 days.  If patient returns call to provider office, please advise to call Embedded Care Management Care Guide Penne Lash * at [phone] Lash, RMA Care Guide [facility]  [place], [place] [address] Direct Dial: [phone] [email or web address]

## 2023-04-08 NOTE — Progress Notes (Signed)
  Chronic Care Management   Note  04/08/2023 Name: JOCELINE RINGLEY MRN: [id number] DOB: 1961/08/02  Thressa Sheller Pelc is a 62 y.o. year old female who is a primary care patient of Arnette Felts, FNP. I reached out to Chelsea Primus by phone today in response to a referral sent by Ms. Jaidan L Derosia's PCP.  The second contact attempt was unsuccessful.   Follow up plan: Additional outreach attempts will be made.  Penne Lash, RMA Care Guide [facility]  [place], [place] [address] Direct Dial: [phone] [email or web address]

## 2023-04-18 DIAGNOSIS — J45909 Unspecified asthma, uncomplicated: Secondary | ICD-10-CM | POA: Diagnosis not present

## 2023-04-23 NOTE — Progress Notes (Signed)
  Care Management   Outreach Note  04/23/2023 Name: Stacey Brock MRN: [id number] DOB: 1960/10/11  Third unsuccessful telephone outreach was attempted today to contact the patient about Chronic Care Management needs.The patient was referred to the case management team by the provider who initiated CCM services. The provider has been notified of our unsuccessful attempts to make or maintain contact with the patient.   Follow Up Plan:  We have been unable to make contact with the patient for follow up. The care management team is available to follow up with the patient after provider conversation with the patient regarding recommendation for care management engagement and subsequent re-referral to the care management team.   Penne Lash, RMA Care Guide  [facility]  [place], [place] [address] Direct Dial: [phone] [email or web address]

## 2023-05-19 DIAGNOSIS — J45909 Unspecified asthma, uncomplicated: Secondary | ICD-10-CM | POA: Diagnosis not present

## 2023-06-19 DIAGNOSIS — J45909 Unspecified asthma, uncomplicated: Secondary | ICD-10-CM | POA: Diagnosis not present

## 2023-07-19 DIAGNOSIS — J45909 Unspecified asthma, uncomplicated: Secondary | ICD-10-CM | POA: Diagnosis not present

## 2023-07-29 NOTE — Progress Notes (Deleted)
Madelaine Bhat, CMA,acting as a Neurosurgeon for Arnette Felts, FNP.,have documented all relevant documentation on the behalf of Arnette Felts, FNP,as directed by  Arnette Felts, FNP while in the presence of Arnette Felts, FNP.  Subjective:    Patient ID: Stacey Brock , female    DOB: 1961/03/31 , 62 y.o.   MRN: [id number]  No chief complaint on file.   HPI  Patient presents today for HM, Patient reports compliance with medication. Patient denies any chest pain, SOB, or headaches. Patient has no concerns today.     Past Medical History:  Diagnosis Date  . Allergy   . Anxiety    OCC  . Asthma    pt states she has usewd 02 at home in thepast  but NO use in the last year or more as she uses her MDI's daily  . Myocardial infarction (HCC)    4098,1191 per pt  . Non-ST elevation (NSTEMI) myocardial infarction (HCC) 06/01/2019  . Sinus trouble      Family History  Problem Relation Age of Onset  . Colon cancer Father   . Cancer Father        colon  . Asthma Sister   . Colon polyps Neg Hx   . Esophageal cancer Neg Hx   . Stomach cancer Neg Hx   . Rectal cancer Neg Hx      Current Outpatient Medications:  .  albuterol (PROVENTIL) (2.5 MG/3ML) 0.083% nebulizer solution, Take 3 mLs (2.5 mg total) by nebulization every 6 (six) hours as needed for wheezing or shortness of breath., Disp: 75 mL, Rfl: 12 .  albuterol (VENTOLIN HFA) 108 (90 Base) MCG/ACT inhaler, INHALE 2 PUFFS INTO THE LUNGS EVERY 4 HOURS AS NEEDED FOR WHEEZING, Disp: 18 g, Rfl: 3 .  loratadine (CLARITIN) 10 MG tablet, Take 1 tablet (10 mg total) by mouth at bedtime., Disp: 90 tablet, Rfl: 1 .  montelukast (SINGULAIR) 10 MG tablet, Take 1 tablet (10 mg total) by mouth at bedtime., Disp: 90 tablet, Rfl: 1 .  rosuvastatin (CRESTOR) 20 MG tablet, Take 1 tablet (20 mg total) by mouth daily at 6 PM., Disp: 90 tablet, Rfl: 1 .  SYMBICORT 160-4.5 MCG/ACT inhaler, Inhale 2 puffs into the lungs 2 (two) times daily., Disp: 10.2 g, Rfl:  6 .  Vitamin D, Ergocalciferol, (DRISDOL) 1.25 MG (50000 UNIT) CAPS capsule, Take 1 capsule (50,000 Units total) by mouth every 7 (seven) days., Disp: 12 capsule, Rfl: 1   Allergies  Allergen Reactions  . Aspirin Other (See Comments)    Pt cannot specify reaction type and unsure if true allergy, but she states " I almost died after I had Aspirin, my heart stopped on operating table".   . Ibuprofen Shortness Of Breath    Unsure if this is a true allergy. Patient presents with sweating, tachy, dizziness, flushing      The patient states she uses {contraceptive methods:5051} for birth control. No LMP recorded. Patient is postmenopausal.. {Dysmenorrhea-menorrhagia:21918}. Negative for: breast discharge, breast lump(s), breast pain and breast self exam. Associated symptoms include abnormal vaginal bleeding. Pertinent negatives include abnormal bleeding (hematology), anxiety, decreased libido, depression, difficulty falling sleep, dyspareunia, history of infertility, nocturia, sexual dysfunction, sleep disturbances, urinary incontinence, urinary urgency, vaginal discharge and vaginal itching. Diet regular.The patient states her exercise level is    . The patient's tobacco use is:  Social History   Tobacco Use  Smoking Status Never  Smokeless Tobacco Never  . She has been exposed to passive  smoke. The patient's alcohol use is:  Social History   Substance and Sexual Activity  Alcohol Use No  . Additional information: Last pap ***, next one scheduled for ***.    Review of Systems   There were no vitals filed for this visit. There is no height or weight on file to calculate BMI.  Wt Readings from Last 3 Encounters:  03/20/23 86 lb (39 kg)  11/14/22 80 lb (36.3 kg)  08/08/22 80 lb (36.3 kg)     Objective:  Physical Exam      Assessment And Plan:     Encounter for annual health examination  Mixed hyperlipidemia  Abnormal glucose  Vitamin D deficiency     No follow-ups on  file. Patient was given opportunity to ask questions. Patient verbalized understanding of the plan and was able to repeat key elements of the plan. All questions were answered to their satisfaction.   Arnette Felts, FNP  I, Arnette Felts, FNP, have reviewed all documentation for this visit. The documentation on 07/29/23 for the exam, diagnosis, procedures, and orders are all accurate and complete.

## 2023-07-30 ENCOUNTER — Encounter: Payer: 59 | Admitting: Nurse Practitioner

## 2023-07-30 DIAGNOSIS — R7309 Other abnormal glucose: Secondary | ICD-10-CM

## 2023-07-30 DIAGNOSIS — E782 Mixed hyperlipidemia: Secondary | ICD-10-CM

## 2023-07-30 DIAGNOSIS — E559 Vitamin D deficiency, unspecified: Secondary | ICD-10-CM

## 2023-07-30 DIAGNOSIS — Z0000 Encounter for general adult medical examination without abnormal findings: Secondary | ICD-10-CM

## 2023-07-31 ENCOUNTER — Emergency Department (HOSPITAL_COMMUNITY)
Admission: EM | Admit: 2023-07-31 | Discharge: 2023-07-31 | Disposition: A | Discharge status: Home / Self Care | Payer: 59 | Attending: Emergency Medicine | Admitting: Emergency Medicine

## 2023-07-31 ENCOUNTER — Emergency Department (HOSPITAL_COMMUNITY): Payer: 59

## 2023-07-31 ENCOUNTER — Encounter (HOSPITAL_COMMUNITY): Payer: Self-pay

## 2023-07-31 ENCOUNTER — Other Ambulatory Visit: Payer: Self-pay

## 2023-07-31 DIAGNOSIS — R140 Abdominal distension (gaseous): Secondary | ICD-10-CM | POA: Diagnosis not present

## 2023-07-31 DIAGNOSIS — R112 Nausea with vomiting, unspecified: Secondary | ICD-10-CM | POA: Insufficient documentation

## 2023-07-31 DIAGNOSIS — R9431 Abnormal electrocardiogram [ECG] [EKG]: Secondary | ICD-10-CM | POA: Diagnosis not present

## 2023-07-31 DIAGNOSIS — M6208 Separation of muscle (nontraumatic), other site: Secondary | ICD-10-CM | POA: Diagnosis not present

## 2023-07-31 LAB — URINALYSIS, ROUTINE W REFLEX MICROSCOPIC
Bilirubin Urine: NEGATIVE
Glucose, UA: NEGATIVE mg/dL
Hgb urine dipstick: NEGATIVE
Ketones, ur: 5 mg/dL — AB
Leukocytes,Ua: NEGATIVE
Nitrite: NEGATIVE
Protein, ur: NEGATIVE mg/dL
Specific Gravity, Urine: 1.021 (ref 1.005–1.030)
pH: 5 (ref 5.0–8.0)

## 2023-07-31 LAB — CBC WITH DIFFERENTIAL/PLATELET
Abs Immature Granulocytes: 0.01 10*3/uL (ref 0.00–0.07)
Basophils Absolute: 0.1 10*3/uL (ref 0.0–0.1)
Basophils Relative: 1 %
Eosinophils Absolute: 0.2 10*3/uL (ref 0.0–0.5)
Eosinophils Relative: 4 %
HCT: 34.9 % — ABNORMAL LOW (ref 36.0–46.0)
Hemoglobin: 11.3 g/dL — ABNORMAL LOW (ref 12.0–15.0)
Immature Granulocytes: 0 %
Lymphocytes Relative: 27 %
Lymphs Abs: 1.5 10*3/uL (ref 0.7–4.0)
MCH: 28.3 pg (ref 26.0–34.0)
MCHC: 32.4 g/dL (ref 30.0–36.0)
MCV: 87.5 fL (ref 80.0–100.0)
Monocytes Absolute: 0.7 10*3/uL (ref 0.1–1.0)
Monocytes Relative: 13 %
Neutro Abs: 3.1 10*3/uL (ref 1.7–7.7)
Neutrophils Relative %: 55 %
Platelets: 311 10*3/uL (ref 150–400)
RBC: 3.99 MIL/uL (ref 3.87–5.11)
RDW: 13.2 % (ref 11.5–15.5)
WBC: 5.7 10*3/uL (ref 4.0–10.5)
nRBC: 0 % (ref 0.0–0.2)

## 2023-07-31 LAB — COMPREHENSIVE METABOLIC PANEL
ALT: 13 U/L (ref 0–44)
AST: 17 U/L (ref 15–41)
Albumin: 4.2 g/dL (ref 3.5–5.0)
Alkaline Phosphatase: 44 U/L (ref 38–126)
Anion gap: 12 (ref 5–15)
BUN: 12 mg/dL (ref 8–23)
CO2: 25 mmol/L (ref 22–32)
Calcium: 9.2 mg/dL (ref 8.9–10.3)
Chloride: 102 mmol/L (ref 98–111)
Creatinine, Ser: 0.59 mg/dL (ref 0.44–1.00)
GFR, Estimated: >60 mL/min (ref >60)
Glucose, Bld: 95 mg/dL (ref 70–99)
Potassium: 3.8 mmol/L (ref 3.5–5.1)
Sodium: 139 mmol/L (ref 135–145)
Total Bilirubin: 1.7 mg/dL — ABNORMAL HIGH (ref 0.3–1.2)
Total Protein: 7.1 g/dL (ref 6.5–8.1)

## 2023-07-31 LAB — LIPASE, BLOOD: Lipase: 29 U/L (ref 11–51)

## 2023-07-31 MED ORDER — IOHEXOL 350 MG/ML SOLN
40.0000 mL | Freq: Once | INTRAVENOUS | Status: AC | PRN
Start: 2023-07-31 — End: 2023-07-31
  Administered 2023-07-31: 40 mL via INTRAVENOUS

## 2023-07-31 NOTE — ED Notes (Signed)
Pt given crackers, PB, and graham crackers along with something to drink

## 2023-07-31 NOTE — ED Triage Notes (Signed)
Pt c/o N/V started last night.

## 2023-07-31 NOTE — ED Notes (Signed)
Pt given additional ginger ale as requested

## 2023-07-31 NOTE — ED Provider Triage Note (Signed)
Emergency Medicine Provider Triage Evaluation Note  Stacey Brock , a 62 y.o. female  was evaluated in triage.  Pt complains of n/v. Report having nausea and vomited green emesis this AM. Normal BM.  No fever, chills, body aches.  Minimal abdominal pain, no c/p or sob.  No urinary sxs  Review of Systems  Positive: As above Negative: As above  Physical Exam  BP (!) 144/73 (BP Location: Right Arm)   Pulse (!) 105   Temp 98.2 F (36.8 C)   Resp 16   Ht 4\' 8"  (1.422 m)   Wt 39 kg   SpO2 96%   BMI 19.28 kg/m  Gen:   Awake, no distress   Resp:  Normal effort  MSK:   Moves extremities without difficulty  Other:    Medical Decision Making  Medically screening exam initiated at 1:27 PM.  Appropriate orders placed.  Stacey Brock was informed that the remainder of the evaluation will be completed by another provider, this initial triage assessment does not replace that evaluation, and the importance of remaining in the ED until their evaluation is complete.     Fayrene Helper, PA-C 07/31/23 1332

## 2023-07-31 NOTE — ED Provider Notes (Signed)
[place] [facility] DEPARTMENT AT [facility] Provider Note   CSN: [id number] Arrival date & time: 07/31/23  1229     History  No chief complaint on file.   Stacey Brock is a 62 y.o. female.  This is a 62 year old female with past medical history of prediabetes and hyperlipidemia presenting to the emergency department for nausea, vomiting since last night.  Patient had 1 episode of light green emesis last night and to the ED for evaluation this morning.  She also had sweating with the episode.  Denies chest pain, SOB, fevers, chills, diarrhea, constipation, urinary symptoms, vaginal symptoms, congestion or cough, drug use, alcohol use, or similar symptoms in the past.  Is having bowel movements and passing gas.  No prior abdominal surgeries.  Patient has not attempted to eat today but has an appetite.   The history is provided by the patient, medical records and a relative. No language interpreter was used.       Home Medications Prior to Admission medications   Medication Sig Start Date End Date Taking? Authorizing Provider  albuterol (PROVENTIL) (2.5 MG/3ML) 0.083% nebulizer solution Take 3 mLs (2.5 mg total) by nebulization every 6 (six) hours as needed for wheezing or shortness of breath. 06/01/19   Simmons-Robinson, Makiera, MD  albuterol (VENTOLIN HFA) 108 (90 Base) MCG/ACT inhaler INHALE 2 PUFFS INTO THE LUNGS EVERY 4 HOURS AS NEEDED FOR WHEEZING 03/20/23   Arnette Felts, FNP  loratadine (CLARITIN) 10 MG tablet Take 1 tablet (10 mg total) by mouth at bedtime. 07/24/22   Arnette Felts, FNP  montelukast (SINGULAIR) 10 MG tablet Take 1 tablet (10 mg total) by mouth at bedtime. 03/20/23   Arnette Felts, FNP  rosuvastatin (CRESTOR) 20 MG tablet Take 1 tablet (20 mg total) by mouth daily at 6 PM. 04/02/23   Arnette Felts, FNP  SYMBICORT 160-4.5 MCG/ACT inhaler Inhale 2 puffs into the lungs 2 (two) times daily. 03/20/23   Arnette Felts, FNP  Vitamin D, Ergocalciferol,  (DRISDOL) 1.25 MG (50000 UNIT) CAPS capsule Take 1 capsule (50,000 Units total) by mouth every 7 (seven) days. 04/02/23   Arnette Felts, FNP      Allergies    Aspirin and Ibuprofen    Review of Systems   Review of Systems as per HPI above  Physical Exam Updated Vital Signs BP 118/84   Pulse 72   Temp 98.1 F (36.7 C) (Oral)   Resp 11   Ht 4\' 8"  (1.422 m)   Wt 39 kg   SpO2 99%   BMI 19.28 kg/m  Physical Exam Constitutional:      Appearance: Normal appearance.  HENT:     Head: Normocephalic and atraumatic.     Nose: Nose normal.     Mouth/Throat:     Mouth: Mucous membranes are moist.  Eyes:     Extraocular Movements: Extraocular movements intact.     Pupils: Pupils are equal, round, and reactive to light.  Cardiovascular:     Rate and Rhythm: Normal rate and regular rhythm.     Pulses: Normal pulses.     Heart sounds: Normal heart sounds.  Pulmonary:     Effort: Pulmonary effort is normal.     Breath sounds: Normal breath sounds.  Abdominal:     General: There is no distension.     Palpations: Abdomen is soft.     Tenderness: There is no abdominal tenderness. There is no right CVA tenderness, left CVA tenderness, guarding or rebound.  Comments: Hernia vs diastasis to lower abdominal wall inferior to umbilicus. No erythema, tenderness, warmth. Freely reducible  Musculoskeletal:     Cervical back: Neck supple.     Right lower leg: No edema.     Left lower leg: No edema.  Skin:    General: Skin is warm and dry.     Capillary Refill: Capillary refill takes less than 2 seconds.  Neurological:     General: No focal deficit present.     Mental Status: She is alert and oriented to person, place, and time. Mental status is at baseline.     Cranial Nerves: No cranial nerve deficit.     Sensory: No sensory deficit.     Motor: No weakness.     Coordination: Coordination normal.     ED Results / Procedures / Treatments   Labs (all labs ordered are listed, but only  abnormal results are displayed) Labs Reviewed  CBC WITH DIFFERENTIAL/PLATELET - Abnormal; Notable for the following components:      Result Value   Hemoglobin 11.3 (*)    HCT 34.9 (*)    All other components within normal limits  COMPREHENSIVE METABOLIC PANEL - Abnormal; Notable for the following components:   Total Bilirubin 1.7 (*)    All other components within normal limits  URINALYSIS, ROUTINE W REFLEX MICROSCOPIC - Abnormal; Notable for the following components:   Ketones, ur 5 (*)    All other components within normal limits  LIPASE, BLOOD    EKG EKG Interpretation Date/Time:  Thursday July 31 2023 19:26:55 EDT Ventricular Rate:  83 PR Interval:  141 QRS Duration:  95 QT Interval:  373 QTC Calculation: 439 R Axis:   73  Text Interpretation: Sinus rhythm Borderline T abnormalities, anterior leads Confirmed by Alona Bene [id number]) on 07/31/2023 7:43:37 PM  Radiology CT ABDOMEN PELVIS W CONTRAST  Result Date: 07/31/2023 CLINICAL DATA:  Hernia. EXAM: CT ABDOMEN AND PELVIS WITH CONTRAST TECHNIQUE: Multidetector CT imaging of the abdomen and pelvis was performed using the standard protocol following bolus administration of intravenous contrast. RADIATION DOSE REDUCTION: This exam was performed according to the departmental dose-optimization program which includes automated exposure control, adjustment of the mA and/or kV according to patient size and/or use of iterative reconstruction technique. CONTRAST:  40mL OMNIPAQUE IOHEXOL 350 MG/ML SOLN COMPARISON:  None FINDINGS: Lower chest: Breathing motion lung bases. Slight basilar atelectasis. Hepatobiliary: No focal liver abnormality is seen. No gallstones, gallbladder wall thickening, or biliary dilatation. Patent portal vein Pancreas: Unremarkable. No pancreatic ductal dilatation or surrounding inflammatory changes. Spleen: Normal in size without focal abnormality. Adrenals/Urinary Tract: Adrenal glands are unremarkable. Kidneys  are normal, without renal calculi, focal lesion, or hydronephrosis. Bladder is unremarkable. Stomach/Bowel: Stomach is distended with air. Large bowel has a normal course and caliber with moderate colonic stool. Small bowel is nondilated. Normal appendix in the right lower quadrant. Vascular/Lymphatic: Aortic atherosclerosis. No enlarged abdominal or pelvic lymph nodes. Reproductive: Uterus and bilateral adnexa are unremarkable. Other: Evaluation limited by motion throughout the examination. No obvious free air or free fluid. Slight rectus muscle diastasis identified with some protuberance of the anterior abdominal wall in the midline. No pelvic wall or inguinal hernia. Musculoskeletal: Moderate curvature of the spine with rotatory component and moderate degenerative changes. IMPRESSION: Evaluation limited by motion. There is moderate diffuse colonic stool. No bowel obstruction or free air. Rectus muscle diastasis with slight protuberance of the anterior abdominal wall in the midline. No frank hernia. Curvature of the spine with  degenerative changes. Electronically Signed   By: Karen Kays M.D.   On: 07/31/2023 18:25    Procedures Procedures    Medications Ordered in ED Medications  iohexol (OMNIPAQUE) 350 MG/ML injection 40 mL (40 mLs Intravenous Contrast Given 07/31/23 1704)    ED Course/ Medical Decision Making/ A&P                                 Medical Decision Making 62 year old female who presents with an episode of light green emesis last night. Differential considered includes biliary pathology, hepatitis, gastritis, bowel obstruction, gastroenteritis, food poisoning, UTI, strangulated hernia, atypical ACS, and sequelae of vomiting such as dehydration, hypoglycemia or electrolyte derangements. Vitals are stable and she is well-appearing on exam, is pleasantly conversant, in no acute distress.She does not appear dehydrated clinically or on vitals.  Her abdomen is not peritonitic, is  nontender in any region, negative Murphy sign, no jaundice. Exam/presentation less consistent with an acute surgical process; or bowel obstruction given ongoing BM's and gas.    Labs are notable for normal glucose and electrolytes, no leukocytosis, total bilirubin is 1.7 but the remainder of LFTs are normal and do not fit an obstructive pattern.  Lipase normal. Her physical exam with absence of right upper quadrant tenderness does not suggest cholecystitis or cholangitis.  I performed a point-of-care [place] of her gallbladder/biliary system and it showed a normal gallbladder without stones, normal wall thickness, no pericholecystic fluid, and negative sonographic murphy's. It also showed a normal common bile duct diameter, not consistent with choledocholithiasis.   Lower concern for atypical ACS. EKG was obtained to screen/augment suspicion. It shows NSR rate of 83, normal axis and intervals, no ST elevations or depressions, no pathologic T wave inversions, no overt ischemic changes. Doubt ACS  She has what appears to be a hernia versus diastasis recti of the lower abdominal wall. No concern for strangulation/incarceration based on exam above. CT was obtained to further evaluate and shows diastasis, no hernia. No other acute pathology seen on CT.    Updated patient on the results of her workup.  Exceedingly low suspicion for acute life-threatening pathology based on above.  Repeat abdominal exam still with no tenderness change from prior.  Patient was p.o. challenged and able to tolerate without issue.  Stable appropriate for discharge with plan PCP follow-up.  Given phone number for surgery clinic in case symptoms worsen.  discharged in stable condition.  Amount and/or Complexity of Data Reviewed External Data Reviewed: labs and radiology. Labs: ordered. Decision-making details documented in ED Course. Radiology: ordered and independent interpretation performed. Decision-making details documented in ED  Course. ECG/medicine tests: ordered and independent interpretation performed. Decision-making details documented in ED Course.  Risk Prescription drug management.          Final Clinical Impression(s) / ED Diagnoses Final diagnoses:  Nausea and vomiting, unspecified vomiting type  Diastasis recti    Rx / DC Orders ED Discharge Orders     None         Karmen Stabs, MD 07/31/23 2043    Maia Plan, MD 08/05/23 [phone]

## 2023-07-31 NOTE — ED Notes (Signed)
Patient transported to CT 

## 2023-08-12 ENCOUNTER — Telehealth: Payer: Self-pay

## 2023-08-12 NOTE — Transitions of Care (Post Inpatient/ED Visit) (Signed)
   08/12/2023  Name: Stacey Brock MRN: [id number] DOB: 31-Jan-1961  Today's TOC FU Call Status: Today's TOC FU Call Status:: Unsuccessful Call (1st Attempt) Unsuccessful Call (1st Attempt) Date: 08/12/23  Attempted to reach the patient regarding the most recent Inpatient/ED visit.  Follow Up Plan: Additional outreach attempts will be made to reach the patient to complete the Transitions of Care (Post Inpatient/ED visit) call.   Signature Lisabeth Devoid, [place]

## 2023-08-14 ENCOUNTER — Ambulatory Visit: Payer: Self-pay | Admitting: Nurse Practitioner

## 2023-08-14 ENCOUNTER — Ambulatory Visit: Payer: 59

## 2023-08-14 ENCOUNTER — Telehealth: Payer: Self-pay

## 2023-08-14 VITALS — BP 100/70 | HR 90 | Temp 97.8°F | Ht <= 58 in | Wt 89.2 lb

## 2023-08-14 DIAGNOSIS — Z0000 Encounter for general adult medical examination without abnormal findings: Secondary | ICD-10-CM

## 2023-08-14 DIAGNOSIS — Z23 Encounter for immunization: Secondary | ICD-10-CM | POA: Diagnosis not present

## 2023-08-14 NOTE — Transitions of Care (Post Inpatient/ED Visit) (Signed)
   08/14/2023  Name: Stacey Brock MRN: [id number] DOB: October 15, 1960  Today's TOC FU Call Status: Today's TOC FU Call Status:: Unsuccessful Call (2nd Attempt) Unsuccessful Call (2nd Attempt) Date: 08/14/23  Attempted to reach the patient regarding the most recent Inpatient/ED visit.  Follow Up Plan: Additional outreach attempts will be made to reach the patient to complete the Transitions of Care (Post Inpatient/ED visit) call.   Signature Lisabeth Devoid, [place]

## 2023-08-14 NOTE — Patient Instructions (Signed)
Stacey Brock , Thank you for taking time to come for your Medicare Wellness Visit. I appreciate your ongoing commitment to your health goals. Please review the following plan we discussed and let me know if I can assist you in the future.   Referrals/Orders/Follow-Ups/Clinician Recommendations: none  This is a list of the screening recommended for you and due dates:  Health Maintenance  Topic Date Due   Mammogram  Never done   Flu Shot  05/08/2023   COVID-19 Vaccine (4 - 2023-24 season) 06/08/2023   Pap with HPV screening  07/23/2024   Medicare Annual Wellness Visit  08/13/2024   Colon Cancer Screening  10/03/2031   DTaP/Tdap/Td vaccine (2 - Td or Tdap) 07/24/2032   Hepatitis C Screening  Completed   HIV Screening  Completed   Zoster (Shingles) Vaccine  Completed   HPV Vaccine  Aged Out    Advanced directives: (Declined) Advance directive discussed with you today. Even though you declined this today, please call our office should you change your mind, and we can give you the proper paperwork for you to fill out.  Next Medicare Annual Wellness Visit scheduled for next year: No, office will schedule appointment  insert Preventive Care Attachment Reference

## 2023-08-14 NOTE — Progress Notes (Signed)
Subjective:   Stacey Brock is a 62 y.o. female who presents for Medicare Annual (Subsequent) preventive examination.  Visit Complete: In person    Cardiac Risk Factors include: dyslipidemia     Objective:    Today's Vitals   08/14/23 1114  BP: 100/70  Pulse: 90  Temp: 97.8 F (36.6 C)  TempSrc: Oral  SpO2: 96%  Weight: 89 lb 3.2 oz (40.5 kg)  Height: 4' 8.8" (1.443 m)   Body mass index is 19.44 kg/m.     08/14/2023   11:23 AM 07/25/2022   12:22 PM 01/17/2021    2:56 PM 09/15/2019    2:21 PM 05/30/2019    6:09 PM 09/09/2018    3:12 PM 07/12/2016    9:03 PM  Advanced Directives  Does Patient Have a Medical Advance Directive? No No No No No No   Would patient like information on creating a medical advance directive? No - Patient declined No - Patient declined  No - Patient declined No - Patient declined No - Patient declined      Information is confidential and restricted. Go to Review Flowsheets to unlock data.    Current Medications (verified) Outpatient Encounter Medications as of 08/14/2023  Medication Sig   albuterol (PROVENTIL) (2.5 MG/3ML) 0.083% nebulizer solution Take 3 mLs (2.5 mg total) by nebulization every 6 (six) hours as needed for wheezing or shortness of breath.   albuterol (VENTOLIN HFA) 108 (90 Base) MCG/ACT inhaler INHALE 2 PUFFS INTO THE LUNGS EVERY 4 HOURS AS NEEDED FOR WHEEZING   loratadine (CLARITIN) 10 MG tablet Take 1 tablet (10 mg total) by mouth at bedtime.   montelukast (SINGULAIR) 10 MG tablet Take 1 tablet (10 mg total) by mouth at bedtime.   SYMBICORT 160-4.5 MCG/ACT inhaler Inhale 2 puffs into the lungs 2 (two) times daily.   rosuvastatin (CRESTOR) 20 MG tablet Take 1 tablet (20 mg total) by mouth daily at 6 PM. (Patient not taking: Reported on 08/14/2023)   Vitamin D, Ergocalciferol, (DRISDOL) 1.25 MG (50000 UNIT) CAPS capsule Take 1 capsule (50,000 Units total) by mouth every 7 (seven) days. (Patient not taking: Reported on 08/14/2023)    No facility-administered encounter medications on file as of 08/14/2023.    Allergies (verified) Aspirin and Ibuprofen   History: Past Medical History:  Diagnosis Date   Allergy    Anxiety    OCC   Asthma    pt states she has usewd 02 at home in thepast  but NO use in the last year or more as she uses her MDI's daily   Myocardial infarction [facility])    [phone] per pt   Non-ST elevation (NSTEMI) myocardial infarction (HCC) 06/01/2019   Sinus trouble    Past Surgical History:  Procedure Laterality Date   FRACTURE SURGERY Left 05/2019   left arm broken   LEFT HEART CATH AND CORONARY ANGIOGRAPHY N/A 06/01/2019   Procedure: LEFT HEART CATH AND CORONARY ANGIOGRAPHY;  Surgeon: Lyn Records, MD;  Location: [facility] INV[facility];  Service: Cardiovascular;  Laterality: N/A;   Family History  Problem Relation Age of Onset   Colon cancer Father    Cancer Father        colon   Asthma Sister    Colon polyps Neg Hx    Esophageal cancer Neg Hx    Stomach cancer Neg Hx    Rectal cancer Neg Hx    Social History   Socioeconomic History   Marital status: Single  Spouse name: Not on file   Number of children: 0   Years of education: Not on file   Highest education level: Not on file  Occupational History   Occupation: disabled  Tobacco Use   Smoking status: Never   Smokeless tobacco: Never  Vaping Use   Vaping status: Never Used  Substance and Sexual Activity   Alcohol use: No   Drug use: No   Sexual activity: Not Currently  Other Topics Concern   Not on file  Social History Narrative   Not on file   Social Determinants of Health   Financial Resource Strain: Low Risk  (08/14/2023)   Overall Financial Resource Strain (CARDIA)    Difficulty of Paying Living Expenses: Not hard at all  Food Insecurity: No Food Insecurity (08/14/2023)   Hunger Vital Sign    Worried About Running Out of Food in the Last Year: Never true    Ran Out of Food in the Last Year: Never true   Transportation Needs: No Transportation Needs (08/14/2023)   PRAPARE - Administrator, Civil Service (Medical): No    Lack of Transportation (Non-Medical): No  Physical Activity: Sufficiently Active (08/14/2023)   Exercise Vital Sign    Days of Exercise per Week: 7 days    Minutes of Exercise per Session: 150+ min  Stress: No Stress Concern Present (08/14/2023)   [employer] of Occupational Health - Occupational Stress Questionnaire    Feeling of Stress : Not at all  Social Connections: Socially Isolated (08/14/2023)   Social Connection and Isolation Panel [NHANES]    Frequency of Communication with Friends and Family: Never    Frequency of Social Gatherings with Friends and Family: Three times a week    Attends Religious Services: Never    Active Member of Clubs or Organizations: No    Attends Banker Meetings: Never    Marital Status: Never married    Tobacco Counseling Counseling given: Not Answered   Clinical Intake:  Pre-visit preparation completed: Yes  Pain : No/denies pain     Nutritional Status: BMI of 19-24  Normal Nutritional Risks: Nausea/ vomitting/ diarrhea (vomited once last month) Diabetes: No  How often do you need to have someone help you when you read instructions, pamphlets, or other written materials from your doctor or pharmacy?: 1 - Never  Interpreter Needed?: No  Information entered by :: NAllen LPN   Activities of Daily Living    08/14/2023   11:17 AM  In your present state of health, do you have any difficulty performing the following activities:  Hearing? 1  Vision? 1  Comment sees yellow spots sometimes and blurry  Difficulty concentrating or making decisions? 0  Walking or climbing stairs? 0  Dressing or bathing? 0  Doing errands, shopping? 0  Preparing Food and eating ? N  Using the Toilet? N  In the past six months, have you accidently leaked urine? N  Do you have problems with loss of bowel control?  N  Managing your Medications? N  Managing your Finances? N  Housekeeping or managing your Housekeeping? N    Patient Care Team: Arnette Felts, FNP as PCP - General (General Practice)  Indicate any recent Medical Services you may have received from other than [facility] providers in the past year (date may be approximate).     Assessment:   This is a routine wellness examination for Stacey Brock.  Hearing/Vision screen Hearing Screening - Comments:: States has appointment with ear doctor  Vision Screening - Comments:: No regular eye exams   Goals Addressed             This Visit's Progress    Patient Stated       08/14/2023, denies goals       Depression Screen    08/14/2023   11:25 AM 03/20/2023    2:37 PM 11/14/2022    3:01 PM 07/25/2022   12:24 PM 01/23/2022    3:40 PM 01/17/2021    2:58 PM 01/17/2021    2:17 PM  PHQ 2/9 Scores  PHQ - 2 Score 0 0 0 0 1 0 0  PHQ- 9 Score 4 0   1      Fall Risk    08/14/2023   11:24 AM 03/20/2023    2:35 PM 11/14/2022    3:01 PM 07/25/2022   12:24 PM 07/24/2022    2:18 PM  Fall Risk   Falls in the past year? 0 0 0 0 1  Number falls in past yr: 0 0  0 0  Injury with Fall? 0 0  0 0  Risk for fall due to : No Fall Risks No Fall Risks  Medication side effect No Fall Risks  Follow up Falls prevention discussed Falls evaluation completed  Falls prevention discussed;Education provided;Falls evaluation completed Falls evaluation completed    MEDICARE RISK AT HOME: Medicare Risk at Home Any stairs in or around the home?: Yes If so, are there any without handrails?: No Home free of loose throw rugs in walkways, pet beds, electrical cords, etc?: Yes Adequate lighting in your home to reduce risk of falls?: Yes Life alert?: No Use of a cane, walker or w/c?: No Grab bars in the bathroom?: Yes Shower chair or bench in shower?: No Elevated toilet seat or a handicapped toilet?: No  TIMED UP AND GO:  Was the test performed?  Yes  Length of time to  ambulate 10 feet: 5 sec Gait steady and fast without use of assistive device    Cognitive Function:        08/14/2023   11:27 AM 07/25/2022   12:26 PM 01/23/2022    3:15 PM 01/17/2021    2:59 PM 09/15/2019    2:25 PM  6CIT Screen  What Year? 0 points 0 points 0 points 0 points 0 points  What month? 0 points 0 points 0 points 0 points 0 points  What time? 0 points 0 points 0 points 0 points 0 points  Count back from 20 0 points 0 points 0 points 0 points 0 points  Months in reverse 0 points 0 points 0 points 0 points 0 points  Repeat phrase 0 points 10 points 4 points 8 points 4 points  Total Score 0 points 10 points 4 points 8 points 4 points    Immunizations Immunization History  Administered Date(s) Administered   Influenza,inj,Quad PF,6+ Mos 06/09/2013, 06/16/2018, 09/15/2019, 07/19/2020, 07/23/2021, 07/24/2022   PFIZER(Purple Top)SARS-COV-2 Vaccination 05/13/2020, 06/05/2020, 11/29/2020   Pneumococcal Polysaccharide-23 06/09/2013   Tdap 07/24/2022   Zoster Recombinant(Shingrix) 08/08/2022, 11/14/2022    TDAP status: Up to date  Flu Vaccine status: Completed at today's visit  Pneumococcal vaccine status: Up to date  Covid-19 vaccine status: Information provided on how to obtain vaccines.   Qualifies for Shingles Vaccine? Yes   Zostavax completed No   Shingrix Completed?: Yes  Screening Tests Health Maintenance  Topic Date Due   MAMMOGRAM  Never done   INFLUENZA VACCINE  05/08/2023  COVID-19 Vaccine (4 - 2023-24 season) 06/08/2023   Cervical Cancer Screening (HPV/Pap Cotest)  07/23/2024   Medicare Annual Wellness (AWV)  08/13/2024   Colonoscopy  10/03/2031   DTaP/Tdap/Td (2 - Td or Tdap) 07/24/2032   Hepatitis C Screening  Completed   HIV Screening  Completed   Zoster Vaccines- Shingrix  Completed   HPV VACCINES  Aged Out    Health Maintenance  Health Maintenance Due  Topic Date Due   MAMMOGRAM  Never done   INFLUENZA VACCINE  05/08/2023   COVID-19  Vaccine (4 - 2023-24 season) 06/08/2023    Colorectal cancer screening: Type of screening: Colonoscopy. Completed 10/02/2021. Repeat every 10 years  Mammogram status: Ordered 11/14/2022. Pt provided with contact info and advised to call to schedule appt.   Bone Density status: n/a  Lung Cancer Screening: (Low Dose CT Chest recommended if Age 64-80 years, 20 pack-year currently smoking OR have quit w/in 15years.) does not qualify.   Lung Cancer Screening Referral: no  Additional Screening:  Hepatitis C Screening: does qualify; Completed 01/17/2020  Vision Screening: Recommended annual ophthalmology exams for early detection of glaucoma and other disorders of the eye. Is the patient up to date with their annual eye exam?  No  Who is the provider or what is the name of the office in which the patient attends annual eye exams? none If pt is not established with a provider, would they like to be referred to a provider to establish care? No .   Dental Screening: Recommended annual dental exams for proper oral hygiene  Diabetic Foot Exam: n/a  Community Resource Referral / Chronic Care Management: CRR required this visit?  No   CCM required this visit?  No     Plan:     I have personally reviewed and noted the following in the patient's chart:   Medical and social history Use of alcohol, tobacco or illicit drugs  Current medications and supplements including opioid prescriptions. Patient is not currently taking opioid prescriptions. Functional ability and status Nutritional status Physical activity Advanced directives List of other physicians Hospitalizations, surgeries, and ER visits in previous 12 months Vitals Screenings to include cognitive, depression, and falls Referrals and appointments  In addition, I have reviewed and discussed with patient certain preventive protocols, quality metrics, and best practice recommendations. A written personalized care plan for  preventive services as well as general preventive health recommendations were provided to patient.     Barb Merino, LPN   06/14/1190   After Visit Summary: (In Person-Printed) AVS printed and given to the patient  Nurse Notes: none

## 2023-08-19 ENCOUNTER — Ambulatory Visit: Payer: 59 | Admitting: Nurse Practitioner

## 2023-08-19 ENCOUNTER — Encounter: Payer: Self-pay | Admitting: Nurse Practitioner

## 2023-08-19 VITALS — BP 110/60 | HR 64 | Temp 98.1°F | Ht <= 58 in | Wt 90.2 lb

## 2023-08-19 DIAGNOSIS — R142 Eructation: Secondary | ICD-10-CM | POA: Diagnosis not present

## 2023-08-19 DIAGNOSIS — K30 Functional dyspepsia: Secondary | ICD-10-CM | POA: Diagnosis not present

## 2023-08-19 DIAGNOSIS — I700 Atherosclerosis of aorta: Secondary | ICD-10-CM

## 2023-08-19 DIAGNOSIS — M6208 Separation of muscle (nontraumatic), other site: Secondary | ICD-10-CM | POA: Diagnosis not present

## 2023-08-19 DIAGNOSIS — R7303 Prediabetes: Secondary | ICD-10-CM

## 2023-08-19 DIAGNOSIS — Z23 Encounter for immunization: Secondary | ICD-10-CM

## 2023-08-19 DIAGNOSIS — J4520 Mild intermittent asthma, uncomplicated: Secondary | ICD-10-CM | POA: Diagnosis not present

## 2023-08-19 DIAGNOSIS — J45909 Unspecified asthma, uncomplicated: Secondary | ICD-10-CM | POA: Diagnosis not present

## 2023-08-19 DIAGNOSIS — E782 Mixed hyperlipidemia: Secondary | ICD-10-CM

## 2023-08-19 DIAGNOSIS — E559 Vitamin D deficiency, unspecified: Secondary | ICD-10-CM

## 2023-08-19 DIAGNOSIS — F79 Unspecified intellectual disabilities: Secondary | ICD-10-CM

## 2023-08-19 MED ORDER — VITAMIN D (ERGOCALCIFEROL) 1.25 MG (50000 UNIT) PO CAPS: 50000.0000 [IU] | ORAL_CAPSULE | ORAL | 1 refills | Status: AC

## 2023-08-19 MED ORDER — ROSUVASTATIN CALCIUM 20 MG PO TABS: 20.0000 mg | ORAL_TABLET | Freq: Every day | ORAL | 1 refills | Status: DC

## 2023-08-19 MED ORDER — SYMBICORT 160-4.5 MCG/ACT IN AERO: 2.0000 | INHALATION_SPRAY | Freq: Two times a day (BID) | RESPIRATORY_TRACT | 6 refills | Status: DC

## 2023-08-19 NOTE — Patient Instructions (Addendum)
Call Dr. Tyrell Antonio - orthopedic - for her scoliosis       Address: [address] [place], [place] [address]       Phone: [phone]  I have placed a dietician order for her prediabetes please allow 7-10 business days for them to contact you for an appt.  I have also placed an order for social work who may assist with her phone, and other resources necessary Provide [place] with a copy of health care power of attorney form.

## 2023-08-19 NOTE — Progress Notes (Signed)
Madelaine Bhat, CMA,acting as a Neurosurgeon for Arnette Felts, FNP.,have documented all relevant documentation on the behalf of Arnette Felts, FNP,as directed by  Arnette Felts, FNP while in the presence of Arnette Felts, FNP.  Subjective:  Patient ID: Stacey Brock , female    DOB: 25-Oct-1960 , 62 y.o.   MRN: [id number]  Chief Complaint  Patient presents with   Hyperlipidemia    HPI  Patient presents today for a chol follow up, Patient reports compliance with medication. Patient denies any chest pain, SOB, or headaches. Patient reports a hospital visit on 07/30/2024 she reports she is feeling better but she may have to have surgery. Patient reports some indigestion and burping recently. Her cousin lives in [place]. Working on trying to get someone to move in with her (cousin Bjorn Loser).   Her cousin Alcide Clever is here with her today.      Past Medical History:  Diagnosis Date   Allergy    Anxiety    OCC   Asthma    pt states she has usewd 02 at home in thepast  but NO use in the last year or more as she uses her MDI's daily   Myocardial infarction [facility])    [phone] per pt   Non-ST elevation (NSTEMI) myocardial infarction (HCC) 06/01/2019   Sinus trouble      Family History  Problem Relation Age of Onset   Colon cancer Father    Cancer Father        colon   Asthma Sister    Colon polyps Neg Hx    Esophageal cancer Neg Hx    Stomach cancer Neg Hx    Rectal cancer Neg Hx      Current Outpatient Medications:    albuterol (PROVENTIL) (2.5 MG/3ML) 0.083% nebulizer solution, Take 3 mLs (2.5 mg total) by nebulization every 6 (six) hours as needed for wheezing or shortness of breath., Disp: 75 mL, Rfl: 12   albuterol (VENTOLIN HFA) 108 (90 Base) MCG/ACT inhaler, INHALE 2 PUFFS INTO THE LUNGS EVERY 4 HOURS AS NEEDED FOR WHEEZING, Disp: 18 g, Rfl: 3   loratadine (CLARITIN) 10 MG tablet, Take 1 tablet (10 mg total) by mouth at bedtime., Disp: 90 tablet, Rfl: 1   montelukast (SINGULAIR)  10 MG tablet, Take 1 tablet (10 mg total) by mouth at bedtime., Disp: 90 tablet, Rfl: 1   metFORMIN (GLUCOPHAGE) 500 MG tablet, Take 1 tablet (500 mg total) by mouth daily with breakfast., Disp: 90 tablet, Rfl: 1   rosuvastatin (CRESTOR) 20 MG tablet, Take 1 tablet (20 mg total) by mouth daily at 6 PM., Disp: 90 tablet, Rfl: 1   SYMBICORT 160-4.5 MCG/ACT inhaler, Inhale 2 puffs into the lungs 2 (two) times daily., Disp: 10.2 g, Rfl: 6   Vitamin D, Ergocalciferol, (DRISDOL) 1.25 MG (50000 UNIT) CAPS capsule, Take 1 capsule (50,000 Units total) by mouth every 7 (seven) days., Disp: 12 capsule, Rfl: 1   Allergies  Allergen Reactions   Aspirin Other (See Comments)    Pt cannot specify reaction type and unsure if true allergy, but she states " I almost died after I had Aspirin, my heart stopped on operating table".    Ibuprofen Shortness Of Breath    Unsure if this is a true allergy. Patient presents with sweating, tachy, dizziness, flushing     Review of Systems  Constitutional: Negative.   Respiratory: Negative.    Cardiovascular: Negative.   Gastrointestinal:  Negative for abdominal distention, blood in stool,  constipation, diarrhea, nausea and vomiting.  Musculoskeletal: Negative.   Neurological: Negative.   Psychiatric/Behavioral: Negative.       Today's Vitals   08/19/23 1532  BP: 110/60  Pulse: 64  Temp: 98.1 F (36.7 C)  TempSrc: Oral  Weight: 90 lb 3.2 oz (40.9 kg)  Height: 4\' 8"  (1.422 m)  PainSc: 0-No pain   Body mass index is 20.22 kg/m.  Wt Readings from Last 3 Encounters:  08/19/23 90 lb 3.2 oz (40.9 kg)  08/14/23 89 lb 3.2 oz (40.5 kg)  07/31/23 85 lb 15.7 oz (39 kg)     Objective:  Physical Exam Vitals reviewed.  Constitutional:      General: She is not in acute distress.    Appearance: Normal appearance.  Cardiovascular:     Rate and Rhythm: Normal rate and regular rhythm.     Pulses: Normal pulses.     Heart sounds: Normal heart sounds. No murmur  heard. Pulmonary:     Effort: Pulmonary effort is normal. No respiratory distress.     Breath sounds: No wheezing.  Musculoskeletal:     Thoracic back: Scoliosis present.  Neurological:     General: No focal deficit present.     Mental Status: She is alert and oriented to person, place, and time.     Cranial Nerves: No cranial nerve deficit.     Motor: No weakness.  Psychiatric:        Mood and Affect: Mood normal.        Behavior: Behavior normal.        Thought Content: Thought content normal.        Judgment: Judgment normal.         Assessment And Plan:  Mixed hyperlipidemia Assessment & Plan: Continue statin, tolerating well.  Orders: -     Lipid panel  Prediabetes Assessment & Plan: Hemoglobin A1c is stable.  No current medications this is diet controlled.  Orders: -     Hemoglobin A1c -     Amb ref to Medical Nutrition Therapy-MNT  Burping Assessment & Plan: I have advised her to take a probiotic daily this may help.  Encouraged to limit intake of fatty foods and high gas producing foods.   Indigestion  Mild intermittent asthma without complication Assessment & Plan: Stable, she is advised to make sure she is using her Symbicort regularly.  Specially with the changes in the weather.  Orders: -     Symbicort; Inhale 2 puffs into the lungs 2 (two) times daily.  Dispense: 10.2 g; Refill: 6  Vitamin D deficiency Assessment & Plan: Will check vitamin D level and supplement as needed.    Also encouraged to spend 15 minutes in the sun daily.    Orders: -     Vitamin D (Ergocalciferol); Take 1 capsule (50,000 Units total) by mouth every 7 (seven) days.  Dispense: 12 capsule; Refill: 1  Rectus diastasis Assessment & Plan: At this time we will continue to monitor if she continues to have pain or discomfort we will consider referral to general surgeon.  Will send her to physical therapy first.  Orders: -     Ambulatory referral to Physical Therapy  Mental  disability Assessment & Plan: Per her family member her cousin advised that patient has a mental disability however she is able to stay on her own and they check on her frequently.  Orders: -     AMB Referral VBCI Care Management  COVID-19 vaccine administered Assessment &  Plan: Covid 19 vaccine given in office observed for 15 minutes without any adverse reaction    Orders: Best boy Vaccine 11yrs & older  Atherosclerosis of aorta (HCC) Assessment & Plan: Continue statin, tolerating well   Other orders -     Rosuvastatin Calcium; Take 1 tablet (20 mg total) by mouth daily at 6 PM.  Dispense: 90 tablet; Refill: 1    Return for schedule HM in 4 months.  Patient was given opportunity to ask questions. Patient verbalized understanding of the plan and was able to repeat key elements of the plan. All questions were answered to their satisfaction.    Jeanell Sparrow, FNP, have reviewed all documentation for this visit. The documentation on 08/30/23 for the exam, diagnosis, procedures, and orders are all accurate and complete.   IF YOU HAVE BEEN REFERRED TO A SPECIALIST, IT MAY TAKE 1-2 WEEKS TO SCHEDULE/PROCESS THE REFERRAL. IF YOU HAVE NOT HEARD FROM US/SPECIALIST IN TWO WEEKS, PLEASE GIVE [place] A CALL AT [phone].

## 2023-08-20 ENCOUNTER — Telehealth: Payer: Self-pay | Admitting: *Deleted

## 2023-08-20 ENCOUNTER — Other Ambulatory Visit: Payer: Self-pay | Admitting: Nurse Practitioner

## 2023-08-20 DIAGNOSIS — R7303 Prediabetes: Secondary | ICD-10-CM

## 2023-08-20 LAB — HEMOGLOBIN A1C
Est. average glucose Bld gHb Est-mCnc: 126 mg/dL
Hgb A1c MFr Bld: 6 % — ABNORMAL HIGH (ref 4.8–5.6)

## 2023-08-20 LAB — LIPID PANEL
Chol/HDL Ratio: 4.6 ratio — ABNORMAL HIGH (ref 0.0–4.4)
Cholesterol, Total: 311 mg/dL — ABNORMAL HIGH (ref 100–199)
HDL: 67 mg/dL (ref >39)
LDL Chol Calc (NIH): 223 mg/dL — ABNORMAL HIGH (ref 0–99)
Triglycerides: 119 mg/dL (ref 0–149)
VLDL Cholesterol Cal: 21 mg/dL (ref 5–40)

## 2023-08-20 MED ORDER — METFORMIN HCL 500 MG PO TABS: 500.0000 mg | ORAL_TABLET | Freq: Every day | ORAL | 1 refills | Status: DC

## 2023-08-20 NOTE — Progress Notes (Addendum)
  Care Coordination   Note   08/20/2023 Name: CONCEPCION MCGURRIN MRN: [id number] DOB: [birth year]  Thressa Sheller Akita is a 62 y.o. year old female who sees Arnette Felts, FNP for primary care. I reached out to Chelsea Primus by phone today to offer care coordination services.  Ms. Ha was given information about Care Coordination services today including:   The Care Coordination services include support from the care team which includes your Nurse Coordinator, Clinical Social Worker, or Pharmacist.  The Care Coordination team is here to help remove barriers to the health concerns and goals most important to you. Care Coordination services are voluntary, and the patient may decline or stop services at any time by request to their care team member.   Care Coordination Consent Status: Patient agreed to services and verbal consent obtained. Okay to speak to cousin Fayrene Fearing and Bjorn Loser per patient.   Follow up plan:  Telephone appointment with care coordination team member scheduled for:  08/26/23  Encounter Outcome:  Patient Scheduled  [facility] Coordination Care Guide  Direct Dial: [phone]

## 2023-08-26 ENCOUNTER — Encounter: Payer: Self-pay | Admitting: Licensed Clinical Social Worker

## 2023-08-29 ENCOUNTER — Ambulatory Visit: Payer: Self-pay | Admitting: Licensed Clinical Social Worker

## 2023-08-29 NOTE — Patient Outreach (Signed)
  Care Coordination   Initial Visit Note   08/29/2023 Name: RHEYA WOLAK MRN: [id number] DOB: 10/12/1960  Thressa Sheller Blan is a 62 y.o. year old female who sees Arnette Felts, FNP for primary care. I spoke with  Tamekia L Althaus's POA Orpah Cobb by phone today.  What matters to the patients health and wellness today?  SDOH resources    Goals Addressed             This Visit's Progress    Care Coordination Activities       Care Coordination Interventions: Patient wanted the SW to speak with her cousin Othelia Pulling is the  POA.  Ms. Virgel Bouquet stated that the patients sister was killed in a pedestrian accident on [address] earlier this year, and the family has stepped up to assist the patient. It was stated that the sisters used to live together before the accident.  The family has assisted the patient with redoing the entire house and the patient is currently staying with her brother in [place] until the house is finished.  The patient is not working but does receive $805 in disability benefits, patient does not have any food insecurities but the family will apply for food stamps to see if the patient can get approved. The SW will also send the food pantry list.  The patients POA stated that the patient needs some dental work and will apply for Medicaid The family wanted to know about free or low cost cell phone services, because the phone the patient had through DSS does not work that well, the POA is going to call the patients insurance to see if there are any cell phone benefits, SW will also research other programs and let the POA know of anything.  The POA has questions about home taxes for the patients home and if there are tax relief for individuals with benefits. SW will research the information SW will email the information for food pantries, Application for Medicaid/Food Stamps and Home Tax information to the POA to [email or web address] Sw will follow up on  09/12/2023 at 3:00 pm        SDOH assessments and interventions completed:  Yes  SDOH Interventions Today    Flowsheet Row Most Recent Value  SDOH Interventions   Food Insecurity Interventions Intervention Not Indicated  Housing Interventions Intervention Not Indicated  Transportation Interventions Intervention Not Indicated  Utilities Interventions Intervention Not Indicated        Care Coordination Interventions:  Yes, provided  Interventions Today    Flowsheet Row Most Recent Value  General Interventions   General Interventions Discussed/Reviewed General Interventions Discussed  [No food insecurities but would like a list of food pantires]        Follow up plan: Follow up call scheduled for 09/12/2023 at 3:00 pm    Encounter Outcome:  Patient Visit Completed   Jeanie Cooks, PhD [facility], [facility] Social Worker Direct Dial: [phone]  Fax: [phone]

## 2023-08-29 NOTE — Patient Instructions (Signed)
Visit Information  Thank you for taking time to visit with me today. Please don't hesitate to contact me if I can be of assistance to you.   Following are the goals we discussed today:   Goals Addressed             This Visit's Progress    Care Coordination Activities       Care Coordination Interventions: Patient wanted the SW to speak with her cousin Othelia Pulling is the  POA.  Ms. Virgel Bouquet stated that the patients sister was killed in a pedestrian accident on [address] earlier this year, and the family has stepped up to assist the patient. It was stated that the sisters used to live together before the accident.  The family has assisted the patient with redoing the entire house and the patient is currently staying with her brother in [place] until the house is finished.  The patient is not working but does receive $805 in disability benefits, patient does not have any food insecurities but the family will apply for food stamps to see if the patient can get approved. The SW will also send the food pantry list.  The patients POA stated that the patient needs some dental work and will apply for Medicaid The family wanted to know about free or low cost cell phone services, because the phone the patient had through DSS does not work that well, the POA is going to call the patients insurance to see if there are any cell phone benefits, SW will also research other programs and let the POA know of anything.  The POA has questions about home taxes for the patients home and if there are tax relief for individuals with benefits. SW will research the information SW will email the information for food pantries, Application for Medicaid/Food Stamps and Home Tax information to the POA to [email or web address] Sw will follow up on 09/12/2023 at 3:00 pm        Our next appointment is by telephone on 09/12/2023 at 3:00 pm  Please call the care guide team at [phone] if you need to cancel or  reschedule your appointment.   If you are experiencing a Mental Health or Behavioral Health Crisis or need someone to talk to, please call the Suicide and Crisis Lifeline: 988 go to [facility] [address] [place] [phone]) call 911  The patient verbalized understanding of instructions, educational materials, and care plan provided today and DECLINED offer to receive copy of patient instructions, educational materials, and care plan.   Jeanie Cooks, PhD [facility], [facility] Social Worker Direct Dial: [phone]  Fax: [phone]

## 2023-08-30 DIAGNOSIS — R142 Eructation: Secondary | ICD-10-CM

## 2023-08-30 DIAGNOSIS — F79 Unspecified intellectual disabilities: Secondary | ICD-10-CM | POA: Insufficient documentation

## 2023-08-30 DIAGNOSIS — M6208 Separation of muscle (nontraumatic), other site: Secondary | ICD-10-CM | POA: Insufficient documentation

## 2023-08-30 DIAGNOSIS — K30 Functional dyspepsia: Secondary | ICD-10-CM | POA: Insufficient documentation

## 2023-08-30 DIAGNOSIS — Z23 Encounter for immunization: Secondary | ICD-10-CM | POA: Insufficient documentation

## 2023-08-30 HISTORY — DX: Functional dyspepsia: K30

## 2023-08-30 HISTORY — DX: Encounter for immunization: Z23

## 2023-08-30 HISTORY — DX: Eructation: R14.2

## 2023-08-30 NOTE — Assessment & Plan Note (Signed)
Stable, she is advised to make sure she is using her Symbicort regularly.  Specially with the changes in the weather.

## 2023-08-30 NOTE — Assessment & Plan Note (Signed)
Continue statin, tolerating well 

## 2023-08-30 NOTE — Assessment & Plan Note (Signed)
Covid 19 vaccine given in office observed for 15 minutes without any adverse reaction  

## 2023-08-30 NOTE — Assessment & Plan Note (Signed)
Hemoglobin A1c is stable.  No current medications this is diet controlled.

## 2023-08-30 NOTE — Assessment & Plan Note (Signed)
I have advised her to take a probiotic daily this may help.  Encouraged to limit intake of fatty foods and high gas producing foods.

## 2023-08-30 NOTE — Assessment & Plan Note (Signed)
At this time we will continue to monitor if she continues to have pain or discomfort we will consider referral to general surgeon.  Will send her to physical therapy first.

## 2023-08-30 NOTE — Assessment & Plan Note (Signed)
Per her family member her cousin advised that patient has a mental disability however she is able to stay on her own and they check on her frequently.

## 2023-08-30 NOTE — Assessment & Plan Note (Signed)
Will check vitamin D level and supplement as needed.    Also encouraged to spend 15 minutes in the sun daily.   

## 2023-09-12 ENCOUNTER — Ambulatory Visit: Payer: Self-pay | Admitting: Licensed Clinical Social Worker

## 2023-09-12 NOTE — Patient Outreach (Signed)
  Care Coordination   09/12/2023 Name: Stacey Brock MRN: [id number] DOB: 02/16/1961   Care Coordination Outreach Attempts:  An unsuccessful telephone outreach was attempted for a scheduled appointment today.  Follow Up Plan:  Additional outreach attempts will be made to offer the patient care coordination information and services.   Encounter Outcome:  No Answer   Care Coordination Interventions:  No, not indicated    Jeanie Cooks, PhD [facility], [facility] Social Worker Direct Dial: [phone]  Fax: [phone]

## 2023-09-18 DIAGNOSIS — J45909 Unspecified asthma, uncomplicated: Secondary | ICD-10-CM | POA: Diagnosis not present
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
## 2023-09-19 ENCOUNTER — Ambulatory Visit: Payer: 59 | Attending: Nurse Practitioner

## 2023-09-19 ENCOUNTER — Other Ambulatory Visit: Payer: Self-pay

## 2023-09-19 DIAGNOSIS — M6281 Muscle weakness (generalized): Secondary | ICD-10-CM | POA: Diagnosis not present

## 2023-09-19 DIAGNOSIS — M6208 Separation of muscle (nontraumatic), other site: Secondary | ICD-10-CM | POA: Diagnosis not present

## 2023-09-19 DIAGNOSIS — R293 Abnormal posture: Secondary | ICD-10-CM | POA: Insufficient documentation

## 2023-09-23 ENCOUNTER — Ambulatory Visit: Payer: 59 | Admitting: Nurse Practitioner

## 2023-09-23 ENCOUNTER — Encounter: Payer: Self-pay | Admitting: Nurse Practitioner

## 2023-09-23 VITALS — BP 100/60 | HR 97 | Temp 98.4°F | Ht <= 58 in | Wt 93.2 lb

## 2023-09-23 DIAGNOSIS — M546 Pain in thoracic spine: Secondary | ICD-10-CM

## 2023-09-23 DIAGNOSIS — R7303 Prediabetes: Secondary | ICD-10-CM | POA: Diagnosis not present

## 2023-09-23 DIAGNOSIS — G8929 Other chronic pain: Secondary | ICD-10-CM

## 2023-09-23 NOTE — Patient Instructions (Addendum)
You need to go to [facility] Physical therapy one time a week for 4 weeks, you need to call for an appt. After you complete this you will need to f/u with the Orthopedic doctor.  Call the breast center for your appt for a Mammogram - this is the address and phone number        [address]  [phone]

## 2023-09-23 NOTE — Assessment & Plan Note (Signed)
She has started on metformin overall doing well but has some mild GI upset, encouraged to take a probiotic to see if this helps. Will check HgbA1c at next visit

## 2023-09-23 NOTE — Assessment & Plan Note (Signed)
She is to continue with PT once a week for 4 weeks. Explained to her this is necessary to help her back and rectus diastasis

## 2023-09-23 NOTE — Progress Notes (Signed)
Madelaine Bhat, CMA,acting as a Neurosurgeon for Arnette Felts, FNP.,have documented all relevant documentation on the behalf of Arnette Felts, FNP,as directed by  Arnette Felts, FNP while in the presence of Arnette Felts, FNP.  Subjective:  Patient ID: Stacey Brock , female    DOB: [birth year] , 62 y.o.   MRN: [id number]  No chief complaint on file.   HPI  Patient presents today for a pre dm follow up, Patient reports compliance with medication. Patient denies any chest pain, SOB, or headaches. Patient reports she went to the rehab center and the exercise is too hard for her and the doctor told her he couldn't help her. Patient reports the exercise hurts her back too bad. Patients cousin "Stacey Fearing" reports he thinks its to much metFORMIN because she sleeps to much.   Since taking metformin she has had intermittent diarrhea.  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   01/29/13 RML consolidation   03/05/2013 CXR > repeat 06/09/13 scarring only > no further f/u needed     Sinus trouble      Family History  Problem Relation Age of Onset   Colon cancer Father    Cancer Father        colon   Asthma Sister    Colon polyps Neg Hx    Esophageal cancer Neg Hx    Stomach cancer Neg Hx    Rectal cancer Neg Hx      Current Outpatient Medications:    albuterol (PROVENTIL) (2.5 MG/3ML) 0.083% nebulizer solution, Take 3 mLs (2.5 mg total) by nebulization every 6 (six) hours as needed for wheezing or shortness of breath., Disp: 75 mL, Rfl: 12   albuterol  (VENTOLIN HFA) 108 (90 Base) MCG/ACT inhaler, INHALE 2 PUFFS INTO THE LUNGS EVERY 4 HOURS AS NEEDED FOR WHEEZING, Disp: 18 g, Rfl: 3   loratadine (CLARITIN) 10 MG tablet, Take 1 tablet (10 mg total) by mouth at bedtime., Disp: 90 tablet, Rfl: 1   metFORMIN (GLUCOPHAGE) 500 MG tablet, Take 1 tablet (500 mg total) by mouth daily with breakfast., Disp: 90 tablet, Rfl: 1   montelukast (SINGULAIR) 10 MG tablet, Take 1 tablet (10 mg total) by mouth at bedtime., Disp: 90 tablet, Rfl: 1   rosuvastatin (CRESTOR) 20 MG tablet, Take 1 tablet (20 mg total) by mouth daily at 6 PM., Disp: 90 tablet, Rfl: 1   SYMBICORT 160-4.5 MCG/ACT inhaler, Inhale 2 puffs into the lungs 2 (two) times daily., Disp: 10.2 g, Rfl: 6   Vitamin D, Ergocalciferol, (DRISDOL) 1.25 MG (50000 UNIT) CAPS capsule, Take 1 capsule (50,000 Units total) by mouth every 7 (seven) days., Disp: 12 capsule, Rfl: 1   Allergies  Allergen Reactions   Aspirin Other (See Comments)    Pt cannot specify reaction type and unsure if true allergy, but she states " I almost died after I had Aspirin, my heart stopped on operating table".    Ibuprofen  Shortness Of Breath    Unsure if this is a true allergy. Patient presents with sweating, tachy, dizziness, flushing     Review of Systems  Constitutional: Negative.   Respiratory: Negative.    Cardiovascular: Negative.   Gastrointestinal:  Positive for diarrhea.  Musculoskeletal:  Positive for back pain.  Neurological: Negative.   Psychiatric/Behavioral: Negative.       Today's Vitals   09/23/23 1503  BP: 100/60  Pulse: 97  Temp: 98.4 F (36.9 C)  TempSrc: Oral  Weight: 93 lb 3.2 oz (42.3 kg)  Height: 4\' 8"  (1.422 m)  PainSc: 5   PainLoc: Back   Body mass index is 20.9 kg/m.  Wt Readings from Last 3 Encounters:  09/23/23 93 lb 3.2 oz (42.3 kg)  08/19/23 90 lb 3.2 oz (40.9 kg)  08/14/23 89 lb 3.2 oz (40.5 kg)     Objective:  Physical Exam Vitals reviewed.  Constitutional:       General: She is not in acute distress.    Appearance: Normal appearance.  Cardiovascular:     Rate and Rhythm: Normal rate and regular rhythm.     Pulses: Normal pulses.     Heart sounds: Normal heart sounds. No murmur heard. Pulmonary:     Effort: Pulmonary effort is normal. No respiratory distress.     Breath sounds: Normal breath sounds. No wheezing.  Skin:    General: Skin is warm and dry.     Capillary Refill: Capillary refill takes less than 2 seconds.  Neurological:     General: No focal deficit present.     Mental Status: She is alert and oriented to person, place, and time.     Cranial Nerves: No cranial nerve deficit.     Motor: No weakness.  Psychiatric:        Mood and Affect: Mood normal.        Behavior: Behavior normal.        Thought Content: Thought content normal.        Judgment: Judgment normal.         Assessment And Plan:  Prediabetes Assessment & Plan: She has started on metformin overall doing well but has some mild GI upset, encouraged to take a probiotic to see if this helps. Will check HgbA1c at next visit   Chronic bilateral thoracic back pain Assessment & Plan: She is to continue with PT once a week for 4 weeks. Explained to her this is necessary to help her back and rectus diastasis    Return for keep same next.  Patient was given opportunity to ask questions. Patient verbalized understanding of the plan and was able to repeat key elements of the plan. All questions were answered to their satisfaction.    Jeanell Sparrow, FNP, have reviewed all documentation for this visit. The documentation on 09/23/23 for the exam, diagnosis, procedures, and orders are all accurate and complete.   IF YOU HAVE BEEN REFERRED TO A SPECIALIST, IT MAY TAKE 1-2 WEEKS TO SCHEDULE/PROCESS THE REFERRAL. IF YOU HAVE NOT HEARD FROM US/SPECIALIST IN TWO WEEKS, PLEASE GIVE [place] A CALL AT [phone].

## 2023-09-29 ENCOUNTER — Ambulatory Visit: Payer: Self-pay | Admitting: Licensed Clinical Social Worker

## 2023-09-29 DIAGNOSIS — L03211 Cellulitis of face: Secondary | ICD-10-CM | POA: Diagnosis not present

## 2023-09-29 DIAGNOSIS — L299 Pruritus, unspecified: Secondary | ICD-10-CM | POA: Diagnosis not present

## 2023-09-29 DIAGNOSIS — E119 Type 2 diabetes mellitus without complications: Secondary | ICD-10-CM | POA: Diagnosis not present

## 2023-09-29 DIAGNOSIS — R21 Rash and other nonspecific skin eruption: Secondary | ICD-10-CM | POA: Diagnosis not present

## 2023-09-29 NOTE — Patient Instructions (Addendum)
Visit Information  Thank you for taking time to visit with me today. Please don't hesitate to contact me if I can be of assistance to you.   Following are the goals we discussed today:   Goals Addressed   None     Our next appointment is by telephone on /05/2024 at 2:45 pm  Please call the care guide team at [phone] if you need to cancel or reschedule your appointment.   If you are experiencing a Mental Health or Behavioral Health Crisis or need someone to talk to, please call the Suicide and Crisis Lifeline: 988 go to [facility] [address], [place] [phone]) call 911  The patient verbalized understanding of instructions, educational materials, and care plan provided today and DECLINED offer to receive copy of patient instructions, educational materials, and care plan.   Jeanie Cooks, PhD [facility], [facility] Social Worker Direct Dial: [phone]  Fax: [phone]     Visit Information  Thank you for taking time to visit with me today. Please don't hesitate to contact me if I can be of assistance to you.   Following are the goals we discussed today:   Goals Addressed             This Visit's Progress    COMPLETED: Care Coordination Activities       Care Coordination Interventions: Patient wanted the SW to speak with her cousin Othelia Pulling is the  POA.  Ms. Virgel Bouquet stated that the patients sister was killed in a pedestrian accident [place] [address] earlier this year, and the family has stepped up to assist the patient. It was stated that the sisters used to live together before the accident.  The family has assisted the patient with redoing the entire house and the patient is currently staying with her brother in [place] until the house is finished.  The patient is not working but does receive $805 in disability benefits, patient does not have any food insecurities but  the family will apply for food stamps to see if the patient can get approved. The SW will also send the food pantry list.  The patients POA stated that the patient needs some dental work and will apply for Medicaid The family wanted to know about free or low cost cell phone services, because the phone the patient had through DSS does not work that well, the POA is going to call the patients insurance to see if there are any cell phone benefits, SW will also research other programs and let the POA know of anything.  The POA has questions about home taxes for the patients home and if there are tax relief for individuals with benefits. SW will research the information SW will email the information for food pantries, Application for Medicaid/Food Stamps and Home Tax information to the POA to [email or web address] Sw will follow up on 09/12/2023 at 3:00 pm        No further follow up needed, Sw has encouraged the patient to contact the PCP for any resources needed from the SW  Please call the care guide team at [phone] if you need to cancel or reschedule your appointment.   If you are experiencing a Mental Health or Behavioral Health Crisis or need someone to talk to, please call the Suicide and Crisis Lifeline: 988 go to [facility] [address] [place] [phone]) call 911  The patient verbalized understanding  of instructions, educational materials, and care plan provided today and DECLINED offer to receive copy of patient instructions, educational materials, and care plan.   Jeanie Cooks, PhD [facility], [facility] Social Worker Direct Dial: [phone]  Fax: [phone]

## 2023-09-29 NOTE — Patient Outreach (Signed)
  Care Coordination   Follow Up Visit Note   09/29/2023 Name: Stacey Brock MRN: [id number] DOB: 1961-06-07  Stacey Brock is a 62 y.o. year old female who sees Arnette Felts, FNP for primary care. I spoke with  Chelsea Primus by phone today.  What matters to the patients health and wellness today?  Food Insecurities, cell phone and Medicaid    Goals Addressed             This Visit's Progress    COMPLETED: Care Coordination Activities       Care Coordination Interventions: Patient wanted the SW to speak with her cousin Othelia Pulling is the  POA.  Ms. Virgel Bouquet stated that the patients sister was killed in a pedestrian accident on [address] earlier this year, and the family has stepped up to assist the patient. It was stated that the sisters used to live together before the accident.  The family has assisted the patient with redoing the entire house and the patient is currently staying with her brother in [place] until the house is finished.  The patient is not working but does receive $805 in disability benefits, patient does not have any food insecurities but the family will apply for food stamps to see if the patient can get approved. The SW will also send the food pantry list.  The patients POA stated that the patient needs some dental work and will apply for Medicaid The family wanted to know about free or low cost cell phone services, because the phone the patient had through DSS does not work that well, the POA is going to call the patients insurance to see if there are any cell phone benefits, SW will also research other programs and let the POA know of anything.  The POA has questions about home taxes for the patients home and if there are tax relief for individuals with benefits. SW will research the information SW will email the information for food pantries, Application for Medicaid/Food Stamps and Home Tax information to the POA to [email or web address] Sw will  follow up on 09/12/2023 at 3:00 pm        SDOH assessments and interventions completed:  Yes  SDOH Interventions Today    Flowsheet Row Most Recent Value  SDOH Interventions   Food Insecurity Interventions Intervention Not Indicated  Housing Interventions Intervention Not Indicated  Transportation Interventions Intervention Not Indicated  Utilities Interventions Intervention Not Indicated        Care Coordination Interventions:  Yes, provided  Interventions Today    Flowsheet Row Most Recent Value  General Interventions   General Interventions Discussed/Reviewed General Interventions Reviewed, 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   Encounter Outcome:  Patient Visit Completed   Jeanie Cooks, PhD [facility], [facility] Social Worker Direct Dial: [phone]  Fax: [phone]

## 2023-09-29 NOTE — Patient Outreach (Signed)
  Care Coordination   09/29/2023 Name: Stacey Brock MRN: [id number] DOB: [birth year]   Care Coordination Outreach Attempts:  An unsuccessful outreach was attempted for an appointment today.  Follow Up Plan:  Additional outreach attempts will be made to offer the patient complex care management information and services.   Encounter Outcome:  No Answer   Care Coordination Interventions:  No, not indicated    Jeanie Cooks, PhD [facility], [facility] Social Worker Direct Dial: [phone]  Fax: [phone]

## 2023-10-07 ENCOUNTER — Ambulatory Visit: Payer: 59 | Admitting: Nurse Practitioner

## 2023-10-09 ENCOUNTER — Encounter: Payer: Self-pay | Admitting: Nurse Practitioner

## 2023-10-09 ENCOUNTER — Ambulatory Visit: Payer: 59 | Admitting: Nurse Practitioner

## 2023-10-09 VITALS — BP 110/60 | HR 86 | Temp 98.7°F | Ht <= 58 in | Wt 91.6 lb

## 2023-10-09 DIAGNOSIS — H539 Unspecified visual disturbance: Secondary | ICD-10-CM

## 2023-10-09 DIAGNOSIS — H9193 Unspecified hearing loss, bilateral: Secondary | ICD-10-CM

## 2023-10-09 DIAGNOSIS — R21 Rash and other nonspecific skin eruption: Secondary | ICD-10-CM

## 2023-10-09 MED ORDER — HYDROCORTISONE 0.5 % EX CREA: 1.0000 | TOPICAL_CREAM | Freq: Two times a day (BID) | CUTANEOUS | 0 refills | Status: DC

## 2023-10-09 NOTE — Progress Notes (Signed)
 LILLETTE Kristeen JINNY Gladis, CMA,acting as a neurosurgeon for Gaines Ada, FNP.,have documented all relevant documentation on the behalf of Gaines Ada, FNP,as directed by  Gaines Ada, FNP while in the presence of Gaines Ada, FNP.  Subjective:  Patient ID: Stacey Brock , female    DOB: [birth year] , 63 y.o.   MRN: [id number]  Chief Complaint  Patient presents with   Rash    HPI  Patient presents today for a rash on her face, Patient reports compliance with medication. Patient denies any chest pain, SOB, or headaches. Patient was treated at urgent care in [place] on 09/30/2023 . Patient was giving bacitracin cream and 20 sulfamethox-trime 800-160mg . Patient reports she feel it is doing better. Patient reports she was told she needed to follow up in 2 weeks after urgent care visit.      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   01/29/13 RML consolidation   03/05/2013 CXR > repeat 06/09/13 scarring only > no further f/u needed     Sinus trouble      Family History  Problem Relation Age of Onset   Colon cancer Father    Cancer Father        colon   Asthma Sister    Colon polyps Neg Hx    Esophageal cancer Neg Hx    Stomach cancer Neg Hx    Rectal cancer Neg Hx      Current Outpatient Medications:    albuterol  (PROVENTIL ) (2.5 MG/3ML) 0.083% nebulizer solution, Take 3 mLs (2.5 mg total) by nebulization every 6 (six) hours as needed for wheezing or shortness of breath., Disp: 75 mL, Rfl: 12   albuterol  (VENTOLIN  HFA) 108 (90 Base) MCG/ACT inhaler, INHALE 2 PUFFS INTO THE LUNGS EVERY 4 HOURS AS NEEDED  FOR WHEEZING, Disp: 18 g, Rfl: 3   hydrocortisone  cream 0.5 %, Apply 1 Application topically 2 (two) times daily., Disp: 30 g, Rfl: 0   loratadine  (CLARITIN ) 10 MG tablet, Take 1 tablet (10 mg total) by mouth at bedtime., Disp: 90 tablet, Rfl: 1   metFORMIN  (GLUCOPHAGE ) 500 MG tablet, Take 1 tablet (500 mg total) by mouth daily with breakfast., Disp: 90 tablet, Rfl: 1   montelukast  (SINGULAIR ) 10 MG tablet, Take 1 tablet (10 mg total) by mouth at bedtime., Disp: 90 tablet, Rfl: 1   rosuvastatin  (CRESTOR ) 20 MG tablet, Take 1 tablet (20 mg total) by mouth daily at 6 PM., Disp: 90 tablet, Rfl: 1   SYMBICORT  160-4.5 MCG/ACT inhaler, Inhale 2 puffs into the lungs 2 (two) times daily., Disp: 10.2 g, Rfl: 6   Vitamin D , Ergocalciferol , (DRISDOL ) 1.25 MG (50000 UNIT) CAPS capsule, Take 1 capsule (50,000 Units total) by mouth every 7 (seven) days., Disp: 12 capsule, Rfl: 1   Allergies  Allergen Reactions   Aspirin  Other (See Comments)    Pt cannot specify reaction type and unsure if true allergy, but she states  I almost died after I had Aspirin , my heart stopped on operating table.  Ibuprofen Shortness Of Breath    Unsure if this is a true allergy. Patient presents with sweating, tachy, dizziness, flushing     Review of Systems  Constitutional: Negative.   HENT:  Positive for hearing loss.   Eyes:  Positive for visual disturbance.  Skin:  Positive for rash (face area on chin).  Neurological: Negative.   Psychiatric/Behavioral: Negative.       Today's Vitals   10/09/23 1546  BP: 110/60  Pulse: 86  Temp: 98.7 F (37.1 C)  TempSrc: Oral  Weight: 91 lb 9.6 oz (41.5 kg)  Height: 4' 8 (1.422 m)  PainSc: 0-No pain   Body mass index is 20.54 kg/m.  Wt Readings from Last 3 Encounters:  10/09/23 91 lb 9.6 oz (41.5 kg)  09/23/23 93 lb 3.2 oz (42.3 kg)  08/19/23 90 lb 3.2 oz (40.9 kg)      Objective:  Physical Exam Vitals reviewed.  Constitutional:      General: She is not in  acute distress.    Appearance: Normal appearance.  Cardiovascular:     Rate and Rhythm: Normal rate and regular rhythm.     Pulses: Normal pulses.     Heart sounds: Normal heart sounds. No murmur heard. Pulmonary:     Effort: Pulmonary effort is normal. No respiratory distress.     Breath sounds: Normal breath sounds. No wheezing.  Neurological:     General: No focal deficit present.     Mental Status: She is alert and oriented to person, place, and time.     Cranial Nerves: No cranial nerve deficit.      Assessment And Plan:  Facial rash Assessment & Plan: She has an erythematous rash to chin area, will send Rx for steroid cream to use lightly and she is to continue steroid tabs. Avoid applying make up to area  Orders: -     Hydrocortisone ; Apply 1 Application topically 2 (two) times daily.  Dispense: 30 g; Refill: 0  Bilateral hearing loss, unspecified hearing loss type Assessment & Plan: Per her family member feels her hearing is declining, would like to be evaluated for a hearing test  Orders: -     Ambulatory referral to Audiology  Vision disturbance Assessment & Plan: Will refer to ophthalmologist. She is unable to explain exactly her problems with her eyes.   Orders: -     Ambulatory referral to Ophthalmology    Return for keep same next..  Patient was given opportunity to ask questions. Patient verbalized understanding of the plan and was able to repeat key elements of the plan. All questions were answered to their satisfaction.    LILLETTE Gaines Ada, FNP, have reviewed all documentation for this visit. The documentation on 10/09/23 for the exam, diagnosis, procedures, and orders are all accurate and complete.   IF YOU HAVE BEEN REFERRED TO A SPECIALIST, IT MAY TAKE 1-2 WEEKS TO SCHEDULE/PROCESS THE REFERRAL. IF YOU HAVE NOT HEARD FROM US /SPECIALIST IN TWO WEEKS, PLEASE GIVE US  A CALL AT [phone] X [phone].

## 2023-10-09 NOTE — Patient Instructions (Addendum)
 [facility] [address], [place], [place] [address] Phone: [phone]  Dr. Gilmore Masters - orthopedic Address: [address], [place], [place] [address] Phone: [phone]  Stop using the bacitracin ointment and start using a small amount of hydrocortisone  cream to your face until healed. Continue antibiotic pill until completely gone.  Stop wearing the makeup while you have the rash on your face.

## 2023-10-14 ENCOUNTER — Encounter: Payer: Self-pay | Admitting: Nurse Practitioner

## 2023-10-14 DIAGNOSIS — R21 Rash and other nonspecific skin eruption: Secondary | ICD-10-CM

## 2023-10-14 DIAGNOSIS — H539 Unspecified visual disturbance: Secondary | ICD-10-CM | POA: Insufficient documentation

## 2023-10-14 DIAGNOSIS — H9193 Unspecified hearing loss, bilateral: Secondary | ICD-10-CM | POA: Insufficient documentation

## 2023-10-14 HISTORY — DX: Rash and other nonspecific skin eruption: R21

## 2023-10-14 NOTE — Assessment & Plan Note (Signed)
 She has an erythematous rash to chin area, will send Rx for steroid cream to use lightly and she is to continue steroid tabs. Avoid applying make up to area

## 2023-10-14 NOTE — Assessment & Plan Note (Signed)
 Will refer to ophthalmologist. She is unable to explain exactly her problems with her eyes.

## 2023-10-14 NOTE — Assessment & Plan Note (Signed)
 Per her family member feels her hearing is declining, would like to be evaluated for a hearing test

## 2023-10-15 ENCOUNTER — Encounter: Payer: 59 | Admitting: Licensed Clinical Social Worker

## 2023-10-19 DIAGNOSIS — J45909 Unspecified asthma, uncomplicated: Secondary | ICD-10-CM | POA: Diagnosis not present

## 2023-10-23 ENCOUNTER — Ambulatory Visit
Admission: RE | Admit: 2023-10-23 | Discharge: 2023-10-23 | Disposition: A | Discharge status: Home / Self Care | Payer: 59 | Source: Ambulatory Visit | Attending: Nurse Practitioner | Admitting: Nurse Practitioner

## 2023-10-23 DIAGNOSIS — Z1231 Encounter for screening mammogram for malignant neoplasm of breast: Secondary | ICD-10-CM

## 2023-11-19 DIAGNOSIS — J45909 Unspecified asthma, uncomplicated: Secondary | ICD-10-CM | POA: Diagnosis not present

## 2023-12-08 DIAGNOSIS — H2513 Age-related nuclear cataract, bilateral: Secondary | ICD-10-CM | POA: Diagnosis not present

## 2023-12-08 DIAGNOSIS — E119 Type 2 diabetes mellitus without complications: Secondary | ICD-10-CM | POA: Diagnosis not present

## 2023-12-08 DIAGNOSIS — H35373 Puckering of macula, bilateral: Secondary | ICD-10-CM | POA: Diagnosis not present

## 2023-12-17 ENCOUNTER — Telehealth: Payer: Self-pay | Admitting: Nurse Practitioner

## 2023-12-17 DIAGNOSIS — J45909 Unspecified asthma, uncomplicated: Secondary | ICD-10-CM | POA: Diagnosis not present

## 2023-12-17 NOTE — Telephone Encounter (Signed)
 CALLED PT TO RESCHEDULE 3/13 APPT NO ANSWER LEFT VM. IF PT WANT TO BE SEEN ASAP CAN SCHEDULE WITH PAT OHENHEN.

## 2023-12-18 ENCOUNTER — Encounter: Payer: 59 | Admitting: Nurse Practitioner

## 2023-12-25 DIAGNOSIS — Z20822 Contact with and (suspected) exposure to covid-19: Secondary | ICD-10-CM | POA: Diagnosis not present

## 2023-12-25 DIAGNOSIS — R110 Nausea: Secondary | ICD-10-CM | POA: Diagnosis not present

## 2023-12-25 DIAGNOSIS — R42 Dizziness and giddiness: Secondary | ICD-10-CM | POA: Diagnosis not present

## 2023-12-25 DIAGNOSIS — R1013 Epigastric pain: Secondary | ICD-10-CM | POA: Diagnosis not present

## 2023-12-25 DIAGNOSIS — R112 Nausea with vomiting, unspecified: Secondary | ICD-10-CM | POA: Diagnosis not present

## 2023-12-25 DIAGNOSIS — R1111 Vomiting without nausea: Secondary | ICD-10-CM | POA: Diagnosis not present

## 2023-12-29 ENCOUNTER — Ambulatory Visit: Payer: 59 | Attending: Nurse Practitioner | Admitting: Audiologist

## 2023-12-29 DIAGNOSIS — H903 Sensorineural hearing loss, bilateral: Secondary | ICD-10-CM | POA: Insufficient documentation

## 2023-12-29 NOTE — Procedures (Signed)
  Outpatient Audiology and [facility] [address] [place], [place]  [address] [phone]  AUDIOLOGICAL  EVALUATION  NAME: Stacey Brock     DOB:   [birth year]      MRN: [id number]                                                                                     DATE: 12/29/2023     REFERENT: Arnette Felts, FNP STATUS: Outpatient DIAGNOSIS: Sensorineural Hearing Loss   History: [name] was seen for an audiological evaluation due to difficulty hearing for many years. She feels her sister yelled loudly in her ear as a child leading to this loss. [name] knows she has significant loss in both ears. She has never worn hearing aids. She has occasional buzzing like a bee in her ears. [name]'s mother used hearing aids.  [name] denies pain, pressure, or constant tinnitus. [name] no significant history of hazardous noise exposure.  Medical history shows no additional risk for hearing loss.    Evaluation:  Otoscopy showed a clear view of the tympanic membranes, bilaterally Tympanometry results were consistent with normal middle ear function in the left ear and a flat tympanogram in the right ear Audiometric testing was completed using Conventional Audiometry techniques with insert earphones and supraural headphones. Test results are consistent with moderate sloping to severe sensorineural hearing loss bilaterally. Speech Recognition Thresholds were obtained at 55 dB HL in the right ear and at 55dB HL in the left ear. Word Recognition Testing was completed at 90dBHL and [name] scored 84% in the right ear and 80% in the left.  Results:  The test results were reviewed with [name]. She has moderate sloping to severe sensorineural hearing loss bilaterally. She needs hearing aids.  Audiogram printed and  tow copies provided to [name]. Also [name] was given a handout with instructions on how to contact [facility] for a hearing aid appointment.   Recommendations: Hearing aids recommended for  both ears. Patient given handout explaining how to use [employer] benefit.  Annual audiometric testing recommended to monitor hearing loss for progression.    38 minutes spent testing and counseling on results.   If you have any questions please feel free to contact me at [phone].  Ammie Ferrier Stalnaker Au.D.  Audiologist   12/29/2023  1:15 PM  Cc: Arnette Felts, FNP

## 2024-01-16 ENCOUNTER — Ambulatory Visit: Payer: Self-pay | Admitting: Family Medicine

## 2024-01-17 DIAGNOSIS — J45909 Unspecified asthma, uncomplicated: Secondary | ICD-10-CM | POA: Diagnosis not present

## 2024-02-16 DIAGNOSIS — J45909 Unspecified asthma, uncomplicated: Secondary | ICD-10-CM | POA: Diagnosis not present

## 2024-03-03 DIAGNOSIS — R051 Acute cough: Secondary | ICD-10-CM | POA: Diagnosis not present

## 2024-03-03 DIAGNOSIS — Z760 Encounter for issue of repeat prescription: Secondary | ICD-10-CM | POA: Diagnosis not present

## 2024-03-04 DIAGNOSIS — H905 Unspecified sensorineural hearing loss: Secondary | ICD-10-CM | POA: Diagnosis not present

## 2024-03-18 DIAGNOSIS — J45909 Unspecified asthma, uncomplicated: Secondary | ICD-10-CM | POA: Diagnosis not present

## 2024-03-24 ENCOUNTER — Other Ambulatory Visit: Payer: Self-pay | Admitting: Nurse Practitioner

## 2024-03-24 DIAGNOSIS — R7303 Prediabetes: Secondary | ICD-10-CM

## 2024-04-17 DIAGNOSIS — J45909 Unspecified asthma, uncomplicated: Secondary | ICD-10-CM | POA: Diagnosis not present

## 2024-05-18 DIAGNOSIS — J45909 Unspecified asthma, uncomplicated: Secondary | ICD-10-CM | POA: Diagnosis not present

## 2024-06-15 ENCOUNTER — Ambulatory Visit: Payer: Self-pay

## 2024-06-15 ENCOUNTER — Encounter (HOSPITAL_COMMUNITY): Payer: Self-pay

## 2024-06-15 ENCOUNTER — Ambulatory Visit (INDEPENDENT_AMBULATORY_CARE_PROVIDER_SITE_OTHER)

## 2024-06-15 ENCOUNTER — Ambulatory Visit (HOSPITAL_COMMUNITY)
Admission: EM | Admit: 2024-06-15 | Discharge: 2024-06-15 | Disposition: A | Discharge status: Home / Self Care | Attending: Emergency Medicine | Admitting: Emergency Medicine

## 2024-06-15 DIAGNOSIS — R0602 Shortness of breath: Secondary | ICD-10-CM | POA: Diagnosis not present

## 2024-06-15 DIAGNOSIS — J4520 Mild intermittent asthma, uncomplicated: Secondary | ICD-10-CM | POA: Insufficient documentation

## 2024-06-15 DIAGNOSIS — J4541 Moderate persistent asthma with (acute) exacerbation: Secondary | ICD-10-CM | POA: Diagnosis not present

## 2024-06-15 DIAGNOSIS — J45909 Unspecified asthma, uncomplicated: Secondary | ICD-10-CM | POA: Diagnosis not present

## 2024-06-15 DIAGNOSIS — M7989 Other specified soft tissue disorders: Secondary | ICD-10-CM | POA: Diagnosis not present

## 2024-06-15 DIAGNOSIS — J45998 Other asthma: Secondary | ICD-10-CM | POA: Diagnosis not present

## 2024-06-15 LAB — BASIC METABOLIC PANEL WITH GFR
Anion gap: 12 (ref 5–15)
BUN: 12 mg/dL (ref 8–23)
CO2: 26 mmol/L (ref 22–32)
Calcium: 9 mg/dL (ref 8.9–10.3)
Chloride: 99 mmol/L (ref 98–111)
Creatinine, Ser: 0.67 mg/dL (ref 0.44–1.00)
GFR, Estimated: >60 mL/min (ref >60)
Glucose, Bld: 113 mg/dL — ABNORMAL HIGH (ref 70–99)
Potassium: 3.4 mmol/L — ABNORMAL LOW (ref 3.5–5.1)
Sodium: 137 mmol/L (ref 135–145)

## 2024-06-15 MED ORDER — LORATADINE 10 MG PO TABS: 10.0000 mg | ORAL_TABLET | Freq: Every day | ORAL | 1 refills | Status: DC

## 2024-06-15 MED ORDER — IPRATROPIUM-ALBUTEROL 0.5-2.5 (3) MG/3ML IN SOLN
3.0000 mL | Freq: Once | RESPIRATORY_TRACT | Status: AC
  Administered 2024-06-15: 3 mL via RESPIRATORY_TRACT

## 2024-06-15 MED ORDER — SYMBICORT 160-4.5 MCG/ACT IN AERO: 2.0000 | INHALATION_SPRAY | Freq: Two times a day (BID) | RESPIRATORY_TRACT | 1 refills | Status: DC

## 2024-06-15 MED ORDER — IPRATROPIUM-ALBUTEROL 0.5-2.5 (3) MG/3ML IN SOLN
RESPIRATORY_TRACT | Status: AC
  Filled 2024-06-15: qty 3

## 2024-06-15 MED ORDER — ALBUTEROL SULFATE HFA 108 (90 BASE) MCG/ACT IN AERS: INHALATION_SPRAY | RESPIRATORY_TRACT | 1 refills | Status: DC

## 2024-06-15 MED ORDER — ALBUTEROL SULFATE (2.5 MG/3ML) 0.083% IN NEBU
2.5000 mg | INHALATION_SOLUTION | Freq: Four times a day (QID) | RESPIRATORY_TRACT | 1 refills | Status: DC | PRN
Start: 2024-06-15 — End: 2024-08-26

## 2024-06-15 MED ORDER — ROSUVASTATIN CALCIUM 20 MG PO TABS: 20.0000 mg | ORAL_TABLET | Freq: Every day | ORAL | 1 refills | Status: DC

## 2024-06-15 MED ORDER — MONTELUKAST SODIUM 10 MG PO TABS: 10.0000 mg | ORAL_TABLET | Freq: Every day | ORAL | 1 refills | Status: DC

## 2024-06-15 MED ORDER — PREDNISONE 20 MG PO TABS: 40.0000 mg | ORAL_TABLET | Freq: Every day | ORAL | 0 refills | Status: DC

## 2024-06-15 NOTE — Discharge Instructions (Signed)
 Start taking 2 tablets of prednisone  once daily for 5 days for asthma exacerbation. I have refilled your albuterol  inhaler and nebulizer treatments and we have provided you with a nebulizer machine today. Use these every 6 hours as needed for wheezing or shortness of breath. We have drawn a basic metabolic panel today to evaluate your kidney function to ensure that this is not related to your leg swelling.  If these results are concerning someone will call and advise treatment at that time. Otherwise follow-up with your primary care provider for further evaluation and management of your asthma and leg swelling if it continues.   There is a website and phone number attached to paperwork that you can use to get established with a different primary care provider if you would like.
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
## 2024-06-15 NOTE — ED Notes (Signed)
 At home nebulizer given to pt.

## 2024-06-15 NOTE — Telephone Encounter (Signed)
 FYI Only or Action Required?: FYI only for provider.  Patient was last seen in primary care on 10/09/2023 by Georgina Speaks, FNP.  Called Nurse Triage reporting Shortness of Breath.  Symptoms began several days ago.  Interventions attempted: Nothing.  Symptoms are: gradually worsening.  Triage Disposition: See HCP Within 4 Hours (Or PCP Triage)  Patient/caregiver understands and will follow disposition?: Yes                             Copied from CRM #[id number]. Topic: Clinical - Red Word Triage >> Jun 15, 2024 11:40 AM Nathanel DEL wrote: Red Word that prompted transfer to Nurse Triage: Shona Payment is POA.  Pt has asmatic sx.  Not breathing well.  Needs appt asap Reason for Disposition  [1] MILD difficulty breathing (e.g., minimal/no SOB at rest, SOB with walking, pulse < 100) AND [2] NEW-onset or WORSE than normal  Answer Assessment - Initial Assessment Questions 1. RESPIRATORY STATUS: Describe your breathing? (e.g., wheezing, shortness of breath, unable to speak, severe coughing)      Wheezing and SOB 2. ONSET: When did this breathing problem begin?      States symptoms have worsened over past few days  3. PATTERN Does the difficult breathing come and go, or has it been constant since it started?      Comes upon exertion 4. SEVERITY: How bad is your breathing? (e.g., mild, moderate, severe)      Mild, denies SOB at rest 5. RECURRENT SYMPTOM: Have you had difficulty breathing before? If Yes, ask: When was the last time? and What happened that time?      Yes, states she has had asthma her entire life  7. LUNG HISTORY: Do you have any history of lung disease?  (e.g., pulmonary embolus, asthma, emphysema)     Asthma 8. CAUSE: What do you think is causing the breathing problem?      Asthma flare-up 9. OTHER SYMPTOMS: Do you have any other symptoms? (e.g., chest pain, cough, dizziness, fever, runny nose)     Cough, denies chest pain, denies  dizziness, denies fever    This RN spoke to patient's POA, Rhonda. POA called pulmonary line requesting an appointment, without a referral. This RN advised that patient would need to see her PCP first and receive a referral. POA declined and stated she would like to make a TOC appointment to an alternate PCP. TOC appointment scheduled. This RN advised that patient should be seen today, due to current symptoms. No availability in office. This RN advised UC. POA complied and stated she would take patient.  Protocols used: Breathing Difficulty-A-AH

## 2024-06-15 NOTE — ED Triage Notes (Signed)
 Pt c/o asthma flare up and allergies x1wk. States out of all of her medications. States her pulmonologist told her to come here. NAD  Pt c/o rt leg swelling for the past few weeks. States home help nurse came for a visit and noticed the swelling.

## 2024-06-16 ENCOUNTER — Ambulatory Visit (HOSPITAL_COMMUNITY): Payer: Self-pay

## 2024-06-18 DIAGNOSIS — J45909 Unspecified asthma, uncomplicated: Secondary | ICD-10-CM | POA: Diagnosis not present

## 2024-07-13 ENCOUNTER — Encounter (HOSPITAL_COMMUNITY): Payer: Self-pay

## 2024-07-13 ENCOUNTER — Ambulatory Visit (HOSPITAL_COMMUNITY)
Admission: EM | Admit: 2024-07-13 | Discharge: 2024-07-13 | Disposition: A | Discharge status: Home / Self Care | Attending: Emergency Medicine | Admitting: Emergency Medicine

## 2024-07-13 DIAGNOSIS — K047 Periapical abscess without sinus: Secondary | ICD-10-CM | POA: Diagnosis not present

## 2024-07-13 DIAGNOSIS — R220 Localized swelling, mass and lump, head: Secondary | ICD-10-CM

## 2024-07-13 MED ORDER — AMOXICILLIN-POT CLAVULANATE 875-125 MG PO TABS: 1.0000 | ORAL_TABLET | Freq: Two times a day (BID) | ORAL | 0 refills | Status: DC

## 2024-07-13 NOTE — Discharge Instructions (Signed)
 If you have dental insurance, you can call your insurance company and find out which dentists are covered by your insurance. If you do not have dental insurance, see the attached papers on dental resources.   Please see a dentist as soon as possible.

## 2024-07-13 NOTE — ED Triage Notes (Signed)
 Patient here today with c/o right side facial swelling X 1 day. Patient states that she has a h/o her legs swelling. Denies chest pain. Denies headache.
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
## 2024-07-18 DIAGNOSIS — J45909 Unspecified asthma, uncomplicated: Secondary | ICD-10-CM | POA: Diagnosis not present

## 2024-08-19 ENCOUNTER — Ambulatory Visit: Payer: 59

## 2024-08-25 ENCOUNTER — Ambulatory Visit: Payer: Self-pay

## 2024-08-25 VITALS — BP 102/62 | HR 100 | Temp 98.3°F | Ht <= 58 in | Wt 91.6 lb

## 2024-08-25 DIAGNOSIS — Z0000 Encounter for general adult medical examination without abnormal findings: Secondary | ICD-10-CM | POA: Diagnosis not present

## 2024-08-25 NOTE — Progress Notes (Addendum)
 Chief Complaint  Patient presents with   Medicare Wellness     Subjective:   Stacey Brock is a 63 y.o. female who presents for a Medicare Annual Wellness Visit.  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   01/29/13 RML consolidation   03/05/2013 CXR > repeat 06/09/13 scarring only > no further f/u needed     Sinus trouble    Past Surgical History:  Procedure Laterality Date   FRACTURE SURGERY Left 05/2019   left arm broken   LEFT HEART CATH AND CORONARY ANGIOGRAPHY N/A 06/01/2019   Procedure: LEFT HEART CATH AND CORONARY ANGIOGRAPHY;  Surgeon: Claudene Victory ORN, MD;  Location: [facility];  Service: Cardiovascular;  Laterality: N/A;   Family History  Problem Relation Age of Onset   Colon cancer Father    Asthma Sister    Colon polyps Neg Hx    Esophageal cancer Neg Hx    Stomach cancer Neg Hx    Rectal cancer Neg Hx    BRCA 1/2 Neg Hx    Breast cancer Neg Hx    Social History   Occupational History   Occupation: disabled  Tobacco Use   Smoking status: Never   Smokeless tobacco: Never  Vaping Use   Vaping status: Never Used  Substance and Sexual Activity   Alcohol use: No   Drug use: No   Sexual activity: Not Currently   Tobacco Counseling Counseling given: Not Answered  SDOH Screenings   Food Insecurity: No Food Insecurity (08/25/2024)  Housing: Unknown (08/25/2024)  Transportation Needs: No Transportation Needs (08/25/2024)  Utilities: Not At Risk (08/25/2024)  Alcohol Screen: Low Risk  (08/25/2024)   Depression (PHQ2-9): Low Risk  (08/25/2024)  Financial Resource Strain: Low Risk  (08/25/2024)  Physical Activity: Insufficiently Active (08/25/2024)  Social Connections: Socially Isolated (08/25/2024)  Stress: No Stress Concern Present (08/25/2024)  Tobacco Use: Low Risk  (08/25/2024)  Health Literacy: Adequate Health Literacy (08/25/2024)   See flowsheets for full screening details  Depression Screen PHQ 2 & 9 Depression Scale- Over the past 2 weeks, how often have you been bothered by any of the following problems? Little interest or pleasure in doing things: 0 Feeling down, depressed, or hopeless (PHQ Adolescent also includes...irritable): 0 PHQ-2 Total Score: 0     Goals Addressed             This Visit's Progress    Patient Stated       08/25/2024, denies goals       Visit info / Clinical Intake: Medicare Wellness Visit Type:: Subsequent Annual Wellness Visit Persons participating in visit:: patient & caregiver Medicare Wellness Visit Mode:: In-person (required for WTM) Information given by:: patient; family Interpreter Needed?: No Pre-visit prep was completed: yes AWV questionnaire completed by patient prior to visit?: no Living arrangements:: with family/others Patient's Overall Health Status Rating: very good Typical amount of pain: none Does pain affect daily life?: no Are you currently prescribed opioids?:  no  Dietary Habits and Nutritional Risks How many meals a day?: 2 Eats fruit and vegetables daily?: (!) no Most meals are obtained by: eating out; preparing own meals In the last 2 weeks, have you had any of the following?: none Diabetic:: no  Functional Status Activities of Daily Living (to include ambulation/medication): Independent Ambulation: Independent Medication Administration: Independent Home Management: Independent Primary transportation is: family/friends Concerns about vision?: (!) yes (sees spots sometimes) Concerns about hearing?:  (!) yes Uses hearing aids?: (!) yes  Fall Screening Falls in the past year?: 0 Number of falls in past year: 0 Was there an injury with Fall?: 0 Fall Risk Category Calculator: 0 Patient Fall Risk Level: Low Fall Risk  Fall Risk Patient at Risk for Falls Due to: Medication side effect Fall risk Follow up: Falls prevention discussed; Falls evaluation completed  Home and Transportation Safety: All rugs have non-skid backing?: N/A, no rugs All stairs or steps have railings?: yes Grab bars in the bathtub or shower?: yes Have non-skid surface in bathtub or shower?: yes Good home lighting?: yes Regular seat belt use?: yes Hospital stays in the last year:: no  Cognitive Assessment Difficulty concentrating, remembering, or making decisions? : yes Will 6CIT or Mini Cog be Completed: yes What year is it?: 0 points What month is it?: 0 points Give patient an address phrase to remember (5 components): [address] About what time is it?: 0 points Count backwards from 20 to 1: 0 points Say the months of the year in reverse: 0 points Repeat the address phrase from earlier: 10 points 6 CIT Score: 10 points  Advance Directives (For Healthcare) Does Patient Have a Medical Advance Directive?: Yes Type of Advance Directive: Healthcare Power of Attorney Copy of Healthcare Power of Attorney in Chart?: Yes - validated most recent copy scanned in chart (See row information) Would patient like information on creating a medical advance directive?: No - Patient declined  Reviewed/Updated  Reviewed/Updated: Reviewed All (Medical, Surgical, Family, Medications, Allergies, Care Teams, Patient Goals)        Objective:    Today's Vitals   08/25/24 1606  BP: 102/62  Pulse: 100  Temp: 98.3 F (36.8 C)  TempSrc: Oral  SpO2: 94%  Weight: 91 lb 9.6 oz (41.5 kg)  Height: 4' 8 (1.422 m)   Body mass index is 20.54 kg/m.  Current Medications (verified) Outpatient Encounter Medications  as of 08/25/2024  Medication Sig   albuterol  (PROVENTIL ) (2.5 MG/3ML) 0.083% nebulizer solution Take 3 mLs (2.5 mg total) by nebulization every 6 (six) hours as needed for wheezing or shortness of breath.   albuterol  (VENTOLIN  HFA) 108 (90 Base) MCG/ACT inhaler INHALE 2 PUFFS INTO THE LUNGS EVERY 4 HOURS AS NEEDED FOR WHEEZING   hydrocortisone  cream 0.5 % Apply 1 Application topically 2 (two) times daily.   metFORMIN  (GLUCOPHAGE ) 500 MG tablet TAKE 1 TABLET(500 MG) BY MOUTH DAILY WITH BREAKFAST   montelukast  (SINGULAIR ) 10 MG tablet Take 1 tablet (10 mg total) by mouth at bedtime.   rosuvastatin  (CRESTOR ) 20 MG tablet Take 1 tablet (20 mg total) by mouth daily at 6 PM.   SYMBICORT  160-4.5 MCG/ACT inhaler Inhale 2 puffs into the lungs 2 (two) times daily.   amoxicillin -clavulanate (AUGMENTIN ) 875-125 MG tablet Take 1 tablet by mouth 2 (two) times daily. (Patient not taking: Reported on 08/25/2024)   loratadine  (CLARITIN ) 10 MG tablet Take 1 tablet (10 mg total) by mouth at bedtime. (Patient not taking: Reported on 08/25/2024)  Vitamin D , Ergocalciferol , (DRISDOL ) 1.25 MG (50000 UNIT) CAPS capsule Take 1 capsule (50,000 Units total) by mouth every 7 (seven) days. (Patient not taking: Reported on 08/25/2024)   No facility-administered encounter medications on file as of 08/25/2024.   Hearing/Vision screen Hearing Screening - Comments:: Has hearing aids that are maintained Vision Screening - Comments:: Regular eye exams Immunizations and Health Maintenance Health Maintenance  Topic Date Due   COVID-19 Vaccine (5 - 2025-26 season) 06/07/2024   Cervical Cancer Screening (HPV/Pap Cotest)  07/23/2024   Pneumococcal Vaccine: 50+ Years (2 of 2 - PCV) 08/25/2025 (Originally 06/09/2014)   Bone Density Scan  08/25/2025 (Originally 03/18/2024)   Medicare Annual Wellness (AWV)  08/25/2025   Mammogram  10/22/2025   Colonoscopy  10/03/2031   DTaP/Tdap/Td (2 - Td or Tdap) 07/24/2032   Influenza Vaccine   Completed   Hepatitis C Screening  Completed   HIV Screening  Completed   Zoster Vaccines- Shingrix   Completed   Hepatitis B Vaccines 19-59 Average Risk  Aged Out   Meningococcal B Vaccine  Aged Out        Assessment/Plan:  This is a routine wellness examination for Stacey Brock.  Patient Care Team: Georgina Speaks, FNP as PCP - General (General Practice)  I have personally reviewed and noted the following in the patient's chart:   Medical and social history Use of alcohol, tobacco or illicit drugs  Current medications and supplements including opioid prescriptions. Functional ability and status Nutritional status Physical activity Advanced directives List of other physicians Hospitalizations, surgeries, and ER visits in previous 12 months Vitals Screenings to include cognitive, depression, and falls Referrals and appointments  No orders of the defined types were placed in this encounter.  In addition, I have reviewed and discussed with patient certain preventive protocols, quality metrics, and best practice recommendations. A written personalized care plan for preventive services as well as general preventive health recommendations were provided to patient.   Stacey FORBES Dawn, LPN   [id number]/7974   Return in 1 year (on 08/25/2025).  After Visit Summary: (In Person-Printed) AVS printed and given to the patient  Nurse Notes: would like to see PCP for area on face and leg swelling.

## 2024-08-25 NOTE — Patient Instructions (Addendum)
 Stacey Brock,  Thank you for taking the time for your Medicare Wellness Visit. I appreciate your continued commitment to your health goals. Please review the care plan we discussed, and feel free to reach out if I can assist you further.  Please note that Annual Wellness Visits do not include a physical exam. Some assessments may be limited, especially if the visit was conducted virtually. If needed, we may recommend an in-person follow-up with your provider.  Ongoing Care Seeing your primary care provider every 3 to 6 months helps us  monitor your health and provide consistent, personalized care.   Referrals If a referral was made during today's visit and you haven't received any updates within two weeks, please contact the referred provider directly to check on the status.  Recommended Screenings:  Health Maintenance  Topic Date Due   COVID-19 Vaccine (5 - 2025-26 season) 06/07/2024   Pap with HPV screening  07/23/2024   Pneumococcal Vaccine for age over 75 (2 of 2 - PCV) 08/25/2025*   Osteoporosis screening with Bone Density Scan  08/25/2025*   Medicare Annual Wellness Visit  08/25/2025   Breast Cancer Screening  10/22/2025   Colon Cancer Screening  10/03/2031   DTaP/Tdap/Td vaccine (2 - Td or Tdap) 07/24/2032   Flu Shot  Completed   Hepatitis C Screening  Completed   HIV Screening  Completed   Zoster (Shingles) Vaccine  Completed   Hepatitis B Vaccine  Aged Out   Meningitis B Vaccine  Aged Out  *Topic was postponed. The date shown is not the original due date.       08/25/2024    4:14 PM  Advanced Directives  Does Patient Have a Medical Advance Directive? Yes  Type of Advance Directive Healthcare Power of Attorney  Copy of Healthcare Power of Attorney in Chart? Yes - validated most recent copy scanned in chart (See row information)    Vision: Annual vision screenings are recommended for early detection of glaucoma, cataracts, and diabetic retinopathy. These exams can also  reveal signs of chronic conditions such as diabetes and high blood pressure.  Dental: Annual dental screenings help detect early signs of oral cancer, gum disease, and other conditions linked to overall health, including heart disease and diabetes.  Please see the attached documents for additional preventive care recommendations.

## 2024-08-26 ENCOUNTER — Encounter: Payer: Self-pay | Admitting: Family Medicine

## 2024-08-26 ENCOUNTER — Ambulatory Visit
Admission: RE | Admit: 2024-08-26 | Discharge: 2024-08-26 | Disposition: A | Discharge status: Home / Self Care | Source: Ambulatory Visit | Attending: Family Medicine | Admitting: Family Medicine

## 2024-08-26 ENCOUNTER — Ambulatory Visit: Admitting: Family Medicine

## 2024-08-26 ENCOUNTER — Other Ambulatory Visit: Payer: Self-pay | Admitting: Family Medicine

## 2024-08-26 VITALS — BP 112/60 | HR 94 | Temp 98.6°F | Ht <= 58 in | Wt 93.0 lb

## 2024-08-26 DIAGNOSIS — H903 Sensorineural hearing loss, bilateral: Secondary | ICD-10-CM

## 2024-08-26 DIAGNOSIS — R220 Localized swelling, mass and lump, head: Secondary | ICD-10-CM

## 2024-08-26 DIAGNOSIS — F79 Unspecified intellectual disabilities: Secondary | ICD-10-CM | POA: Diagnosis not present

## 2024-08-26 DIAGNOSIS — E782 Mixed hyperlipidemia: Secondary | ICD-10-CM

## 2024-08-26 DIAGNOSIS — R7303 Prediabetes: Secondary | ICD-10-CM

## 2024-08-26 DIAGNOSIS — J4550 Severe persistent asthma, uncomplicated: Secondary | ICD-10-CM

## 2024-08-26 DIAGNOSIS — J4520 Mild intermittent asthma, uncomplicated: Secondary | ICD-10-CM

## 2024-08-26 MED ORDER — ALBUTEROL SULFATE (2.5 MG/3ML) 0.083% IN NEBU: 2.5000 mg | INHALATION_SOLUTION | Freq: Four times a day (QID) | RESPIRATORY_TRACT | 1 refills | Status: AC | PRN

## 2024-08-26 MED ORDER — ALBUTEROL SULFATE HFA 108 (90 BASE) MCG/ACT IN AERS: INHALATION_SPRAY | RESPIRATORY_TRACT | 1 refills | Status: DC

## 2024-08-26 MED ORDER — METFORMIN HCL 500 MG PO TABS: 500.0000 mg | ORAL_TABLET | Freq: Every day | ORAL | 1 refills | Status: AC

## 2024-08-26 MED ORDER — MONTELUKAST SODIUM 10 MG PO TABS: 10.0000 mg | ORAL_TABLET | Freq: Every day | ORAL | 1 refills | Status: DC

## 2024-08-26 MED ORDER — LORATADINE 10 MG PO TABS: 10.0000 mg | ORAL_TABLET | Freq: Every day | ORAL | 1 refills | Status: AC

## 2024-08-26 MED ORDER — ROSUVASTATIN CALCIUM 20 MG PO TABS
20.0000 mg | ORAL_TABLET | Freq: Every day | ORAL | 1 refills | Status: AC
Start: 2024-08-26 — End: ?

## 2024-08-26 MED ORDER — SYMBICORT 160-4.5 MCG/ACT IN AERO: 2.0000 | INHALATION_SPRAY | Freq: Two times a day (BID) | RESPIRATORY_TRACT | 1 refills | Status: DC
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
## 2024-08-27 LAB — LIPID PANEL
Chol/HDL Ratio: 2.1 ratio (ref 0.0–4.4)
Cholesterol, Total: 167 mg/dL (ref 100–199)
HDL: 78 mg/dL (ref >39)
LDL Chol Calc (NIH): 78 mg/dL (ref 0–99)
Triglycerides: 54 mg/dL (ref 0–149)
VLDL Cholesterol Cal: 11 mg/dL (ref 5–40)

## 2024-08-27 LAB — CBC WITH DIFFERENTIAL/PLATELET
Basophils Absolute: 0.1 x10E3/uL (ref 0.0–0.2)
Basos: 1 %
EOS (ABSOLUTE): 0.3 x10E3/uL (ref 0.0–0.4)
Eos: 5 %
Hematocrit: 35 % (ref 34.0–46.6)
Hemoglobin: 11.1 g/dL (ref 11.1–15.9)
Immature Grans (Abs): 0 x10E3/uL (ref 0.0–0.1)
Immature Granulocytes: 0 %
Lymphocytes Absolute: 1.5 x10E3/uL (ref 0.7–3.1)
Lymphs: 25 %
MCH: 28.7 pg (ref 26.6–33.0)
MCHC: 31.7 g/dL (ref 31.5–35.7)
MCV: 90 fL (ref 79–97)
Monocytes Absolute: 0.7 x10E3/uL (ref 0.1–0.9)
Monocytes: 12 %
Neutrophils Absolute: 3.3 x10E3/uL (ref 1.4–7.0)
Neutrophils: 57 %
Platelets: 310 x10E3/uL (ref 150–450)
RBC: 3.87 x10E6/uL (ref 3.77–5.28)
RDW: 13.1 % (ref 11.7–15.4)
WBC: 5.8 x10E3/uL (ref 3.4–10.8)

## 2024-08-27 LAB — BASIC METABOLIC PANEL WITH GFR
BUN/Creatinine Ratio: 23 (ref 12–28)
BUN: 14 mg/dL (ref 8–27)
CO2: 26 mmol/L (ref 20–29)
Calcium: 9.4 mg/dL (ref 8.7–10.3)
Chloride: 101 mmol/L (ref 96–106)
Creatinine, Ser: 0.6 mg/dL (ref 0.57–1.00)
Glucose: 91 mg/dL (ref 70–99)
Potassium: 4.4 mmol/L (ref 3.5–5.2)
Sodium: 140 mmol/L (ref 134–144)
eGFR: 100 mL/min/1.73 (ref >59)

## 2024-08-27 LAB — HEMOGLOBIN A1C
Est. average glucose Bld gHb Est-mCnc: 123 mg/dL
Hgb A1c MFr Bld: 5.9 % — ABNORMAL HIGH (ref 4.8–5.6)

## 2024-09-10 ENCOUNTER — Encounter (HOSPITAL_BASED_OUTPATIENT_CLINIC_OR_DEPARTMENT_OTHER): Payer: Self-pay | Admitting: Family Medicine

## 2024-09-10 ENCOUNTER — Ambulatory Visit (INDEPENDENT_AMBULATORY_CARE_PROVIDER_SITE_OTHER): Admitting: Family Medicine

## 2024-09-10 VITALS — BP 120/82 | HR 104 | Ht <= 58 in | Wt 94.9 lb

## 2024-09-10 DIAGNOSIS — J4540 Moderate persistent asthma, uncomplicated: Secondary | ICD-10-CM | POA: Diagnosis not present

## 2024-09-10 DIAGNOSIS — F79 Unspecified intellectual disabilities: Secondary | ICD-10-CM

## 2024-09-10 DIAGNOSIS — Z7689 Persons encountering health services in other specified circumstances: Secondary | ICD-10-CM | POA: Diagnosis not present

## 2024-09-10 DIAGNOSIS — R443 Hallucinations, unspecified: Secondary | ICD-10-CM | POA: Diagnosis not present

## 2024-09-10 MED ORDER — PREDNISONE 20 MG PO TABS: 40.0000 mg | ORAL_TABLET | Freq: Every day | ORAL | 0 refills | Status: AC

## 2024-09-10 MED ORDER — AIRSUPRA 90-80 MCG/ACT IN AERO: 2.0000 | INHALATION_SPRAY | RESPIRATORY_TRACT | 3 refills | Status: AC | PRN

## 2024-09-10 NOTE — Patient Instructions (Signed)
 Stop Albuterol  inhaler- start Airsupra  (Albuterol -budesonide ) inhaler as rescue
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
## 2024-09-12 ENCOUNTER — Encounter (HOSPITAL_BASED_OUTPATIENT_CLINIC_OR_DEPARTMENT_OTHER): Payer: Self-pay | Admitting: Family Medicine

## 2024-09-12 ENCOUNTER — Ambulatory Visit: Payer: Self-pay | Admitting: Family Medicine

## 2024-09-12 NOTE — Assessment & Plan Note (Signed)
 X-ray ordered.

## 2024-09-12 NOTE — Assessment & Plan Note (Signed)
 Continue statin therapy

## 2024-09-12 NOTE — Assessment & Plan Note (Signed)
 Continue metformin daily

## 2024-09-12 NOTE — Assessment & Plan Note (Signed)
 Able to live alone but family checks on her regularly

## 2024-09-12 NOTE — Progress Notes (Signed)
 A1c is 5.9, still prediabetes. Continue taking metformin  daily. X-ray of your face did not show anything abnormal. No fluid or thickened mucus in your facial bones.  Thanks!

## 2024-09-12 NOTE — Assessment & Plan Note (Signed)
 Continue daily use of symbicort  and singulair 

## 2024-10-08 ENCOUNTER — Ambulatory Visit (INDEPENDENT_AMBULATORY_CARE_PROVIDER_SITE_OTHER): Admitting: Pulmonary Disease

## 2024-10-08 ENCOUNTER — Encounter (HOSPITAL_BASED_OUTPATIENT_CLINIC_OR_DEPARTMENT_OTHER): Payer: Self-pay | Admitting: Pulmonary Disease

## 2024-10-08 DIAGNOSIS — J4540 Moderate persistent asthma, uncomplicated: Secondary | ICD-10-CM | POA: Diagnosis not present

## 2024-10-08 DIAGNOSIS — J4520 Mild intermittent asthma, uncomplicated: Secondary | ICD-10-CM

## 2024-10-08 MED ORDER — MONTELUKAST SODIUM 10 MG PO TABS: 10.0000 mg | ORAL_TABLET | Freq: Every day | ORAL | 3 refills | Status: AC

## 2024-10-08 MED ORDER — SYMBICORT 160-4.5 MCG/ACT IN AERO: 2.0000 | INHALATION_SPRAY | Freq: Two times a day (BID) | RESPIRATORY_TRACT | 11 refills | Status: AC

## 2024-10-08 NOTE — Patient Instructions (Signed)
 Moderate persistent asthma without complication --Continue Symbicort  160-4.5 mcg TWO puffs TWICE a day --Continue Airsupra  1-2 puffs every 6 hours as needed --Continue singulair  10 mg daily --Nebulizer as needed for illness

## 2024-10-08 NOTE — Assessment & Plan Note (Addendum)
--  Continue Symbicort  160-4.5 mcg TWO puffs TWICE a day --Continue Airsupra  1-2 puffs every 6 hours as needed --Continue singulair  10 mg daily --Nebulizer as needed for illness
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
## 2025-02-24 ENCOUNTER — Ambulatory Visit: Admitting: Nurse Practitioner

## 2025-03-14 ENCOUNTER — Ambulatory Visit (HOSPITAL_BASED_OUTPATIENT_CLINIC_OR_DEPARTMENT_OTHER)

## 2025-03-25 ENCOUNTER — Encounter (HOSPITAL_BASED_OUTPATIENT_CLINIC_OR_DEPARTMENT_OTHER): Admitting: Family Medicine

## 2025-04-01 ENCOUNTER — Ambulatory Visit (HOSPITAL_BASED_OUTPATIENT_CLINIC_OR_DEPARTMENT_OTHER): Admitting: Pulmonary Disease

## 2025-09-21 ENCOUNTER — Ambulatory Visit
